# Patient Record
Sex: Male | Born: 1945 | Race: White | Hispanic: No | Marital: Married | Smoking: Former smoker
Health system: Southern US, Community
[De-identification: ages and names within clinical notes are randomized; demographics above are authoritative.]

## PROBLEM LIST (undated history)

## (undated) DIAGNOSIS — I2510 Atherosclerotic heart disease of native coronary artery without angina pectoris: Secondary | ICD-10-CM

## (undated) DIAGNOSIS — K8020 Calculus of gallbladder without cholecystitis without obstruction: Secondary | ICD-10-CM

## (undated) DIAGNOSIS — K5790 Diverticulosis of intestine, part unspecified, without perforation or abscess without bleeding: Secondary | ICD-10-CM

## (undated) DIAGNOSIS — Z972 Presence of dental prosthetic device (complete) (partial): Secondary | ICD-10-CM

## (undated) DIAGNOSIS — Z9289 Personal history of other medical treatment: Secondary | ICD-10-CM

## (undated) DIAGNOSIS — K219 Gastro-esophageal reflux disease without esophagitis: Secondary | ICD-10-CM

## (undated) DIAGNOSIS — I700 Atherosclerosis of aorta: Secondary | ICD-10-CM

## (undated) DIAGNOSIS — I219 Acute myocardial infarction, unspecified: Secondary | ICD-10-CM

## (undated) DIAGNOSIS — K766 Portal hypertension: Secondary | ICD-10-CM

## (undated) DIAGNOSIS — D696 Thrombocytopenia, unspecified: Secondary | ICD-10-CM

## (undated) DIAGNOSIS — M069 Rheumatoid arthritis, unspecified: Secondary | ICD-10-CM

## (undated) DIAGNOSIS — K7460 Unspecified cirrhosis of liver: Secondary | ICD-10-CM

## (undated) DIAGNOSIS — I77810 Thoracic aortic ectasia: Secondary | ICD-10-CM

## (undated) DIAGNOSIS — K4090 Unilateral inguinal hernia, without obstruction or gangrene, not specified as recurrent: Secondary | ICD-10-CM

## (undated) DIAGNOSIS — F1011 Alcohol abuse, in remission: Secondary | ICD-10-CM

## (undated) DIAGNOSIS — R161 Splenomegaly, not elsewhere classified: Secondary | ICD-10-CM

## (undated) DIAGNOSIS — M542 Cervicalgia: Secondary | ICD-10-CM

## (undated) DIAGNOSIS — Z87442 Personal history of urinary calculi: Secondary | ICD-10-CM

## (undated) DIAGNOSIS — I209 Angina pectoris, unspecified: Secondary | ICD-10-CM

## (undated) DIAGNOSIS — I10 Essential (primary) hypertension: Secondary | ICD-10-CM

## (undated) DIAGNOSIS — K08109 Complete loss of teeth, unspecified cause, unspecified class: Secondary | ICD-10-CM

## (undated) DIAGNOSIS — C801 Malignant (primary) neoplasm, unspecified: Secondary | ICD-10-CM

## (undated) DIAGNOSIS — R011 Cardiac murmur, unspecified: Secondary | ICD-10-CM

## (undated) HISTORY — PX: HEMORRHOID SURGERY: SHX153

## (undated) HISTORY — PX: EYE SURGERY: SHX253

## (undated) HISTORY — PX: CARDIAC CATHETERIZATION: SHX172

## (undated) HISTORY — PX: CORONARY ANGIOPLASTY: SHX604

---
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
## 1974-11-24 HISTORY — PX: KNEE ARTHROSCOPY: SUR90

## 2016-01-31 DIAGNOSIS — H2511 Age-related nuclear cataract, right eye: Secondary | ICD-10-CM

## 2016-01-31 HISTORY — DX: Age-related nuclear cataract, right eye: H25.11

## 2016-11-24 HISTORY — PX: COLONOSCOPY: SHX174

## 2016-12-23 DIAGNOSIS — I10 Essential (primary) hypertension: Secondary | ICD-10-CM | POA: Insufficient documentation

## 2017-09-17 DIAGNOSIS — R7301 Impaired fasting glucose: Secondary | ICD-10-CM | POA: Insufficient documentation

## 2017-11-24 DIAGNOSIS — Z9841 Cataract extraction status, right eye: Secondary | ICD-10-CM

## 2017-11-24 DIAGNOSIS — Z9842 Cataract extraction status, left eye: Secondary | ICD-10-CM

## 2017-11-24 HISTORY — PX: CATARACT EXTRACTION W/ INTRAOCULAR LENS  IMPLANT, BILATERAL: SHX1307

## 2017-11-24 HISTORY — DX: Cataract extraction status, right eye: Z98.42

## 2017-11-24 HISTORY — DX: Cataract extraction status, right eye: Z98.41

## 2018-11-23 DIAGNOSIS — Z6831 Body mass index (BMI) 31.0-31.9, adult: Secondary | ICD-10-CM | POA: Insufficient documentation

## 2018-11-23 DIAGNOSIS — K219 Gastro-esophageal reflux disease without esophagitis: Secondary | ICD-10-CM | POA: Insufficient documentation

## 2018-11-23 DIAGNOSIS — E6609 Other obesity due to excess calories: Secondary | ICD-10-CM | POA: Insufficient documentation

## 2019-05-31 DIAGNOSIS — Z6830 Body mass index (BMI) 30.0-30.9, adult: Secondary | ICD-10-CM | POA: Diagnosis not present

## 2019-05-31 DIAGNOSIS — Z0000 Encounter for general adult medical examination without abnormal findings: Secondary | ICD-10-CM | POA: Diagnosis not present

## 2019-05-31 DIAGNOSIS — D696 Thrombocytopenia, unspecified: Secondary | ICD-10-CM | POA: Diagnosis not present

## 2019-05-31 DIAGNOSIS — E6609 Other obesity due to excess calories: Secondary | ICD-10-CM | POA: Diagnosis not present

## 2019-05-31 DIAGNOSIS — I10 Essential (primary) hypertension: Secondary | ICD-10-CM | POA: Diagnosis not present

## 2019-06-08 DIAGNOSIS — M170 Bilateral primary osteoarthritis of knee: Secondary | ICD-10-CM | POA: Diagnosis not present

## 2019-08-09 DIAGNOSIS — I25119 Atherosclerotic heart disease of native coronary artery with unspecified angina pectoris: Secondary | ICD-10-CM | POA: Diagnosis not present

## 2019-08-09 DIAGNOSIS — I10 Essential (primary) hypertension: Secondary | ICD-10-CM | POA: Diagnosis not present

## 2019-08-09 DIAGNOSIS — E6609 Other obesity due to excess calories: Secondary | ICD-10-CM | POA: Diagnosis not present

## 2019-08-09 DIAGNOSIS — R0602 Shortness of breath: Secondary | ICD-10-CM | POA: Diagnosis not present

## 2019-08-09 DIAGNOSIS — Z6830 Body mass index (BMI) 30.0-30.9, adult: Secondary | ICD-10-CM | POA: Diagnosis not present

## 2019-08-09 DIAGNOSIS — K219 Gastro-esophageal reflux disease without esophagitis: Secondary | ICD-10-CM | POA: Diagnosis not present

## 2019-08-18 DIAGNOSIS — I25119 Atherosclerotic heart disease of native coronary artery with unspecified angina pectoris: Secondary | ICD-10-CM | POA: Diagnosis not present

## 2019-08-18 DIAGNOSIS — R0602 Shortness of breath: Secondary | ICD-10-CM | POA: Diagnosis not present

## 2019-08-18 DIAGNOSIS — I10 Essential (primary) hypertension: Secondary | ICD-10-CM | POA: Diagnosis not present

## 2019-11-09 DIAGNOSIS — M170 Bilateral primary osteoarthritis of knee: Secondary | ICD-10-CM | POA: Diagnosis not present

## 2019-11-24 DIAGNOSIS — I10 Essential (primary) hypertension: Secondary | ICD-10-CM | POA: Diagnosis not present

## 2019-11-24 DIAGNOSIS — D696 Thrombocytopenia, unspecified: Secondary | ICD-10-CM | POA: Diagnosis not present

## 2019-12-01 DIAGNOSIS — I10 Essential (primary) hypertension: Secondary | ICD-10-CM | POA: Diagnosis not present

## 2019-12-01 DIAGNOSIS — E6609 Other obesity due to excess calories: Secondary | ICD-10-CM | POA: Diagnosis not present

## 2019-12-01 DIAGNOSIS — F17210 Nicotine dependence, cigarettes, uncomplicated: Secondary | ICD-10-CM | POA: Diagnosis not present

## 2019-12-01 DIAGNOSIS — I25119 Atherosclerotic heart disease of native coronary artery with unspecified angina pectoris: Secondary | ICD-10-CM | POA: Diagnosis not present

## 2019-12-01 DIAGNOSIS — D696 Thrombocytopenia, unspecified: Secondary | ICD-10-CM | POA: Diagnosis not present

## 2019-12-01 DIAGNOSIS — Z125 Encounter for screening for malignant neoplasm of prostate: Secondary | ICD-10-CM | POA: Diagnosis not present

## 2019-12-01 DIAGNOSIS — Z6831 Body mass index (BMI) 31.0-31.9, adult: Secondary | ICD-10-CM | POA: Diagnosis not present

## 2019-12-01 DIAGNOSIS — K589 Irritable bowel syndrome without diarrhea: Secondary | ICD-10-CM | POA: Diagnosis not present

## 2019-12-30 DIAGNOSIS — H26493 Other secondary cataract, bilateral: Secondary | ICD-10-CM | POA: Diagnosis not present

## 2020-02-06 DIAGNOSIS — E6609 Other obesity due to excess calories: Secondary | ICD-10-CM | POA: Diagnosis not present

## 2020-02-06 DIAGNOSIS — I10 Essential (primary) hypertension: Secondary | ICD-10-CM | POA: Diagnosis not present

## 2020-02-06 DIAGNOSIS — I25119 Atherosclerotic heart disease of native coronary artery with unspecified angina pectoris: Secondary | ICD-10-CM | POA: Diagnosis not present

## 2020-02-06 DIAGNOSIS — Z6831 Body mass index (BMI) 31.0-31.9, adult: Secondary | ICD-10-CM | POA: Diagnosis not present

## 2020-02-08 DIAGNOSIS — M1712 Unilateral primary osteoarthritis, left knee: Secondary | ICD-10-CM | POA: Diagnosis not present

## 2020-04-27 DIAGNOSIS — M170 Bilateral primary osteoarthritis of knee: Secondary | ICD-10-CM | POA: Diagnosis not present

## 2020-05-23 DIAGNOSIS — D696 Thrombocytopenia, unspecified: Secondary | ICD-10-CM | POA: Diagnosis not present

## 2020-05-23 DIAGNOSIS — Z125 Encounter for screening for malignant neoplasm of prostate: Secondary | ICD-10-CM | POA: Diagnosis not present

## 2020-05-23 DIAGNOSIS — I10 Essential (primary) hypertension: Secondary | ICD-10-CM | POA: Diagnosis not present

## 2020-05-23 DIAGNOSIS — I25119 Atherosclerotic heart disease of native coronary artery with unspecified angina pectoris: Secondary | ICD-10-CM | POA: Diagnosis not present

## 2020-05-30 DIAGNOSIS — D696 Thrombocytopenia, unspecified: Secondary | ICD-10-CM | POA: Diagnosis not present

## 2020-05-30 DIAGNOSIS — Z0000 Encounter for general adult medical examination without abnormal findings: Secondary | ICD-10-CM | POA: Diagnosis not present

## 2020-05-30 DIAGNOSIS — F17210 Nicotine dependence, cigarettes, uncomplicated: Secondary | ICD-10-CM | POA: Diagnosis not present

## 2020-07-18 DIAGNOSIS — M1712 Unilateral primary osteoarthritis, left knee: Secondary | ICD-10-CM | POA: Diagnosis not present

## 2020-07-18 DIAGNOSIS — M170 Bilateral primary osteoarthritis of knee: Secondary | ICD-10-CM | POA: Diagnosis not present

## 2020-07-18 DIAGNOSIS — M1711 Unilateral primary osteoarthritis, right knee: Secondary | ICD-10-CM | POA: Diagnosis not present

## 2020-08-23 DIAGNOSIS — I10 Essential (primary) hypertension: Secondary | ICD-10-CM | POA: Diagnosis not present

## 2020-08-23 DIAGNOSIS — Z6831 Body mass index (BMI) 31.0-31.9, adult: Secondary | ICD-10-CM | POA: Diagnosis not present

## 2020-08-23 DIAGNOSIS — E6609 Other obesity due to excess calories: Secondary | ICD-10-CM | POA: Diagnosis not present

## 2020-08-23 DIAGNOSIS — I25119 Atherosclerotic heart disease of native coronary artery with unspecified angina pectoris: Secondary | ICD-10-CM | POA: Diagnosis not present

## 2020-08-23 DIAGNOSIS — Z01810 Encounter for preprocedural cardiovascular examination: Secondary | ICD-10-CM | POA: Diagnosis not present

## 2020-08-23 DIAGNOSIS — R0789 Other chest pain: Secondary | ICD-10-CM | POA: Diagnosis not present

## 2020-09-05 DIAGNOSIS — I10 Essential (primary) hypertension: Secondary | ICD-10-CM | POA: Diagnosis not present

## 2020-09-05 DIAGNOSIS — R0789 Other chest pain: Secondary | ICD-10-CM | POA: Diagnosis not present

## 2020-09-05 DIAGNOSIS — I25119 Atherosclerotic heart disease of native coronary artery with unspecified angina pectoris: Secondary | ICD-10-CM | POA: Diagnosis not present

## 2020-09-11 DIAGNOSIS — I10 Essential (primary) hypertension: Secondary | ICD-10-CM | POA: Diagnosis not present

## 2020-09-11 DIAGNOSIS — I25119 Atherosclerotic heart disease of native coronary artery with unspecified angina pectoris: Secondary | ICD-10-CM | POA: Diagnosis not present

## 2020-09-11 DIAGNOSIS — Z125 Encounter for screening for malignant neoplasm of prostate: Secondary | ICD-10-CM | POA: Diagnosis not present

## 2020-09-11 DIAGNOSIS — K219 Gastro-esophageal reflux disease without esophagitis: Secondary | ICD-10-CM | POA: Diagnosis not present

## 2020-09-13 DIAGNOSIS — M1712 Unilateral primary osteoarthritis, left knee: Secondary | ICD-10-CM | POA: Diagnosis not present

## 2020-09-16 DIAGNOSIS — M1712 Unilateral primary osteoarthritis, left knee: Secondary | ICD-10-CM

## 2020-09-16 HISTORY — DX: Unilateral primary osteoarthritis, left knee: M17.12

## 2020-10-03 DIAGNOSIS — M1711 Unilateral primary osteoarthritis, right knee: Secondary | ICD-10-CM | POA: Diagnosis not present

## 2020-10-03 DIAGNOSIS — M1712 Unilateral primary osteoarthritis, left knee: Secondary | ICD-10-CM | POA: Diagnosis not present

## 2020-11-12 DIAGNOSIS — J0100 Acute maxillary sinusitis, unspecified: Secondary | ICD-10-CM | POA: Diagnosis not present

## 2020-11-20 DIAGNOSIS — J0100 Acute maxillary sinusitis, unspecified: Secondary | ICD-10-CM | POA: Diagnosis not present

## 2020-11-25 NOTE — Discharge Instructions (Signed)
Instructions after Total Knee Replacement   Luke Wall, Jr., M.D.     Dept. of Orthopaedics & Sports Medicine  [facility]  [address]  [place], [place]  [address]  Phone: [phone]   Fax: [phone]    DIET: Drink plenty of non-alcoholic fluids. Resume your normal diet. Include foods high in fiber.  ACTIVITY:  You may use crutches or a walker with weight-bearing as tolerated, unless instructed otherwise. You may be weaned off of the walker or crutches by your Physical Therapist.  Do NOT place pillows under the knee. Anything placed under the knee could limit your ability to straighten the knee.   Continue doing gentle exercises. Exercising will reduce the pain and swelling, increase motion, and prevent muscle weakness.   Please continue to use the TED compression stockings for 6 weeks. You may remove the stockings at night, but should reapply them in the morning. Do not drive or operate any equipment until instructed.  WOUND CARE:  Continue to use the PolarCare or ice packs periodically to reduce pain and swelling. You may bathe or shower after the staples are removed at the first office visit following surgery.  MEDICATIONS: You may resume your regular medications. Please take the pain medication as prescribed on the medication. Do not take pain medication on an empty stomach. You have been given a prescription for a blood thinner (Lovenox or Coumadin). Please take the medication as instructed. (NOTE: After completing a 2 week course of Lovenox, take one Enteric-coated aspirin once a day. This along with elevation will help reduce the possibility of phlebitis in your operated leg.) Do not drive or drink alcoholic beverages when taking pain medications.  CALL THE OFFICE FOR: Temperature above 101 degrees Excessive bleeding or drainage on the dressing. Excessive swelling, coldness, or paleness of the toes. Persistent nausea and vomiting.  FOLLOW-UP:  You  should have an appointment to return to the office in 10-14 days after surgery. Arrangements have been made for continuation of Physical Therapy (either home therapy or outpatient therapy).   [facility] Department Directory         [email or web address]       [email or web address]          Cardiology  Appointments: [place] - [phone] [place] - [phone]  Endocrinology  Appointments: [place] - [phone] [place] - [phone]  Gastroenterology  Appointments: [place] - [phone] [place] - [phone]        General Surgery   Appointments: [place] - [phone]  Internal Medicine/Family Medicine  Appointments: [place] - [phone] [place] - [phone] [place] - [phone]  Metabolic and Weigh Loss Surgery  Appointments: [place] - [phone]        Neurology  Appointments: [place] - [phone] [place] - [phone]  Neurosurgery  Appointments: [place] - [phone]  Obstetrics & Gynecology  Appointments: [place] - [phone] [place] - [phone]        Pediatrics  Appointments: [place] - [phone] [place] - [phone]  Physiatry  Appointments: [place] -[phone]  Physical Therapy  Appointments: [place] - [phone] [place] - [phone]        Podiatry  Appointments: [place] - [phone] [place] - [phone]  Pulmonology  Appointments: [place] - [phone]  Rheumatology  Appointments: [place] - [phone]        [place] Location: [facility]  [address] , [place]  [address]  [place] Location: [facility] [address] [place], [place]  [address]  [place] Location: [facility] [address] [place], [place]  [address]    

## 2020-11-29 ENCOUNTER — Other Ambulatory Visit: Payer: Self-pay

## 2020-11-29 ENCOUNTER — Encounter
Admission: RE | Admit: 2020-11-29 | Discharge: 2020-11-29 | Disposition: A | Discharge status: Home / Self Care | Payer: Medicare HMO | Source: Ambulatory Visit | Attending: Orthopedic Surgery | Admitting: Orthopedic Surgery

## 2020-11-29 NOTE — Patient Instructions (Addendum)
Your procedure is scheduled on: 12-14-20 FRIDAY Report to the Registration Desk on the 1st floor of the [facility]-Then proceed to the 2nd floor Surgery Desk in the Medical [facility] To find out your arrival time, please call [phone] between 1PM - 3PM on:12-14-20 THURSDAY  REMEMBER: Instructions that are not followed completely may result in serious medical risk, up to and including death; or upon the discretion of your surgeon and anesthesiologist your surgery may need to be rescheduled.  Do not eat food after midnight the night before surgery.  No gum chewing, lozengers or hard candies.  You may however, drink CLEAR liquids up to 2 hours before you are scheduled to arrive for your surgery. Do not drink anything within 2 hours of your scheduled arrival time.  Clear liquids include: - water  - apple juice without pulp - gatorade  - black coffee or tea (Do NOT add milk or creamers to the coffee or tea) Do NOT drink anything that is not on this list.  In addition, your doctor has ordered for you to drink the provided  Ensure Pre-Surgery Clear Carbohydrate Drink  Drinking this carbohydrate drink up to two hours before surgery helps to reduce insulin resistance and improve patient outcomes. Please complete drinking 2 hours prior to scheduled arrival time.  TAKE THESE MEDICATIONS THE MORNING OF SURGERY WITH A SIP OF WATER: -CRESTOR (ROSUVASTATIN) -ATENOLOL -PRILOSEC (OMEPRAZOLE)-take one the night before and one on the morning of surgery - helps to prevent nausea after surgery.)  Use inhalers on the day of surgery and bring to the hospital-USE YOUR ALBUTEROL INHALER THE MORNING OF SURGERY AND BRING YOUR INHALER TO THE HOSPITAL  One week prior to surgery: Stop Anti-inflammatories (NSAIDS) such as DICLOFENAC(VOLTAREN),Advil, Aleve, Ibuprofen, Motrin, Naproxen, Naprosyn and Aspirin based products such as Excedrin, Goodys Powder, BC Powder-OK TO USE TYLENOL IF NEEDED  Stop ANY OVER THE  COUNTER supplements until after surgery.   No Alcohol for 24 hours before or after surgery.  No Smoking including e-cigarettes for 24 hours prior to surgery.  No chewable tobacco products for at least 6 hours prior to surgery.  No nicotine patches on the day of surgery.  Do not use any "recreational" drugs for at least a week prior to your surgery.  Please be advised that the combination of cocaine and anesthesia may have negative outcomes, up to and including death. If you test positive for cocaine, your surgery will be cancelled.  On the morning of surgery brush your teeth with toothpaste and water, you may rinse your mouth with mouthwash if you wish. Do not swallow any toothpaste or mouthwash.  Do not wear jewelry, make-up, hairpins, clips or nail polish.  Do not wear lotions, powders, or perfumes.   Do not shave body from the neck down 48 hours prior to surgery just in case you cut yourself which could leave a site for infection.  Also, freshly shaved skin may become irritated if using the CHG soap.  Contact lenses, hearing aids and dentures may not be worn into surgery.  Do not bring valuables to the hospital. [facility] is not responsible for any missing/lost belongings or valuables.   Use CHG Soap as directed on instruction sheet.  Notify your doctor if there is any change in your medical condition (cold, fever, infection).  Wear comfortable clothing (specific to your surgery type) to the hospital.  Plan for stool softeners for home use; pain medications have a tendency to cause constipation. You can also  help prevent constipation by eating foods high in fiber such as fruits and vegetables and drinking plenty of fluids as your diet allows.  After surgery, you can help prevent lung complications by doing breathing exercises.  Take deep breaths and cough every 1-2 hours. Your doctor may order a device called an Incentive Spirometer to help you take deep breaths. When  coughing or sneezing, hold a pillow firmly against your incision with both hands. This is called "splinting." Doing this helps protect your incision. It also decreases belly discomfort.  If you are being admitted to the hospital overnight, leave your suitcase in the car. After surgery it may be brought to your room.  If you are being discharged the day of surgery, you will not be allowed to drive home. You will need a responsible adult (18 years or older) to drive you home and stay with you that night.   If you are taking public transportation, you will need to have a responsible adult (18 years or older) with you. Please confirm with your physician that it is acceptable to use public transportation.   Please call the [place] Dept. at [phone] if you have any questions about these instructions.  Visitation Policy:  Patients undergoing a surgery or procedure may have one family member or support person with them as long as that person is not COVID-19 positive or experiencing its symptoms.  That person may remain in the waiting area during the procedure.  Inpatient Visitation:    Visiting hours are 7 a.m. to 8 p.m. Patients will be allowed one visitor. The visitor may change daily. The visitor must pass COVID-19 screenings, use hand sanitizer when entering and exiting the patient's room and wear a mask at all times, including in the patient's room. Patients must also wear a mask when staff or their visitor are in the room. Masking is required regardless of vaccination status. Systemwide, no visitors 17 or younger.

## 2020-12-06 ENCOUNTER — Inpatient Hospital Stay: Admission: RE | Admit: 2020-12-06 | Payer: Medicare HMO | Source: Ambulatory Visit

## 2020-12-12 ENCOUNTER — Other Ambulatory Visit: Payer: Self-pay

## 2020-12-14 ENCOUNTER — Encounter: Admission: RE | Payer: Self-pay | Source: Home / Self Care

## 2020-12-14 ENCOUNTER — Inpatient Hospital Stay: Admission: RE | Admit: 2020-12-14 | Payer: Medicare HMO | Source: Home / Self Care | Admitting: Orthopedic Surgery

## 2020-12-14 SURGERY — ARTHROPLASTY, KNEE, TOTAL, USING IMAGELESS COMPUTER-ASSISTED NAVIGATION
Anesthesia: Choice | Site: Knee | Laterality: Left

## 2021-01-09 DIAGNOSIS — M1711 Unilateral primary osteoarthritis, right knee: Secondary | ICD-10-CM | POA: Diagnosis not present

## 2021-01-31 DIAGNOSIS — M1712 Unilateral primary osteoarthritis, left knee: Secondary | ICD-10-CM | POA: Diagnosis not present

## 2021-01-31 NOTE — Discharge Instructions (Signed)
Instructions after Total Knee Replacement   Banessa Mao P. Shelly Spenser, Jr., M.D.     Dept. of Orthopaedics & Sports Medicine  [facility]  [address]  [place], [place]  [address]  Phone: [phone]   Fax: [phone]    DIET: Drink plenty of non-alcoholic fluids. Resume your normal diet. Include foods high in fiber.  ACTIVITY:  You may use crutches or a walker with weight-bearing as tolerated, unless instructed otherwise. You may be weaned off of the walker or crutches by your Physical Therapist.  Do NOT place pillows under the knee. Anything placed under the knee could limit your ability to straighten the knee.   Continue doing gentle exercises. Exercising will reduce the pain and swelling, increase motion, and prevent muscle weakness.   Please continue to use the TED compression stockings for 6 weeks. You may remove the stockings at night, but should reapply them in the morning. Do not drive or operate any equipment until instructed.  WOUND CARE:  Continue to use the PolarCare or ice packs periodically to reduce pain and swelling. You may bathe or shower after the staples are removed at the first office visit following surgery.  MEDICATIONS: You may resume your regular medications. Please take the pain medication as prescribed on the medication. Do not take pain medication on an empty stomach. You have been given a prescription for a blood thinner (Lovenox or Coumadin). Please take the medication as instructed. (NOTE: After completing a 2 week course of Lovenox, take one Enteric-coated aspirin once a day. This along with elevation will help reduce the possibility of phlebitis in your operated leg.) Do not drive or drink alcoholic beverages when taking pain medications.  CALL THE OFFICE FOR: Temperature above 101 degrees Excessive bleeding or drainage on the dressing. Excessive swelling, coldness, or paleness of the toes. Persistent nausea and vomiting.  FOLLOW-UP:  You  should have an appointment to return to the office in 10-14 days after surgery. Arrangements have been made for continuation of Physical Therapy (either home therapy or outpatient therapy).   [facility] Department Directory         [email or web address]       [email or web address]          Cardiology  Appointments: [place] - [phone] [place] - [phone]  Endocrinology  Appointments: [place] - [phone] [place] - [phone]  Gastroenterology  Appointments: [place] - [phone] [place] - [phone]        General Surgery   Appointments: [place] - [phone]  Internal Medicine/Family Medicine  Appointments: [place] - [phone] [place] - [phone] [place] - [phone]  Metabolic and Weigh Loss Surgery  Appointments: [place] - [phone]        Neurology  Appointments: [place] - [phone] [place] - [phone]  Neurosurgery  Appointments: [place] - [phone]  Obstetrics & Gynecology  Appointments: [place] - [phone] [place] - [phone]        Pediatrics  Appointments: [place] - [phone] [place] - [phone]  Physiatry  Appointments: [place] -[phone]  Physical Therapy  Appointments: [place] - [phone] [place] - [phone]        Podiatry  Appointments: [place] - [phone] [place] - [phone]  Pulmonology  Appointments: [place] - [phone]  Rheumatology  Appointments: [place] - [phone]        [place] Location: [facility]  [address] , [place]  [address]  [place] Location: [facility] [address] [place], [place]  [address]  [place] Location: [facility] [address] [place], [place]  [address]    

## 2021-02-01 ENCOUNTER — Other Ambulatory Visit: Payer: Self-pay

## 2021-02-01 ENCOUNTER — Encounter
Admission: RE | Admit: 2021-02-01 | Discharge: 2021-02-01 | Disposition: A | Discharge status: Home / Self Care | Payer: Medicare HMO | Source: Ambulatory Visit | Attending: Orthopedic Surgery | Admitting: Orthopedic Surgery

## 2021-02-01 DIAGNOSIS — Z01810 Encounter for preprocedural cardiovascular examination: Secondary | ICD-10-CM | POA: Diagnosis not present

## 2021-02-01 DIAGNOSIS — Z01818 Encounter for other preprocedural examination: Secondary | ICD-10-CM | POA: Diagnosis not present

## 2021-02-01 HISTORY — DX: Gastro-esophageal reflux disease without esophagitis: K21.9

## 2021-02-01 HISTORY — DX: Atherosclerotic heart disease of native coronary artery without angina pectoris: I25.10

## 2021-02-01 HISTORY — DX: Essential (primary) hypertension: I10

## 2021-02-01 HISTORY — DX: Personal history of urinary calculi: Z87.442

## 2021-02-01 LAB — URINALYSIS, ROUTINE W REFLEX MICROSCOPIC
Bilirubin Urine: NEGATIVE
Glucose, UA: NEGATIVE mg/dL
Hgb urine dipstick: NEGATIVE
Ketones, ur: NEGATIVE mg/dL
Leukocytes,Ua: NEGATIVE
Nitrite: NEGATIVE
Protein, ur: NEGATIVE mg/dL
Specific Gravity, Urine: 1.011 (ref 1.005–1.030)
pH: 5 (ref 5.0–8.0)

## 2021-02-01 LAB — APTT: aPTT: 34 seconds (ref 24–36)

## 2021-02-01 LAB — COMPREHENSIVE METABOLIC PANEL
ALT: 33 U/L (ref 0–44)
AST: 36 U/L (ref 15–41)
Albumin: 3.9 g/dL (ref 3.5–5.0)
Alkaline Phosphatase: 56 U/L (ref 38–126)
Anion gap: 7 (ref 5–15)
BUN: 16 mg/dL (ref 8–23)
CO2: 26 mmol/L (ref 22–32)
Calcium: 9.6 mg/dL (ref 8.9–10.3)
Chloride: 105 mmol/L (ref 98–111)
Creatinine, Ser: 0.87 mg/dL (ref 0.61–1.24)
GFR, Estimated: >60 mL/min (ref >60)
Glucose, Bld: 84 mg/dL (ref 70–99)
Potassium: 3.7 mmol/L (ref 3.5–5.1)
Sodium: 138 mmol/L (ref 135–145)
Total Bilirubin: 1.6 mg/dL — ABNORMAL HIGH (ref 0.3–1.2)
Total Protein: 6.9 g/dL (ref 6.5–8.1)

## 2021-02-01 LAB — CBC
HCT: 45.8 % (ref 39.0–52.0)
Hemoglobin: 15.8 g/dL (ref 13.0–17.0)
MCH: 31.7 pg (ref 26.0–34.0)
MCHC: 34.5 g/dL (ref 30.0–36.0)
MCV: 92 fL (ref 80.0–100.0)
Platelets: 109 10*3/uL — ABNORMAL LOW (ref 150–400)
RBC: 4.98 MIL/uL (ref 4.22–5.81)
RDW: 13.1 % (ref 11.5–15.5)
WBC: 6 10*3/uL (ref 4.0–10.5)
nRBC: 0 % (ref 0.0–0.2)

## 2021-02-01 LAB — PROTIME-INR
INR: 1.1 (ref 0.8–1.2)
Prothrombin Time: 13.4 seconds (ref 11.4–15.2)

## 2021-02-01 LAB — SEDIMENTATION RATE: Sed Rate: 4 mm/hr (ref 0–20)

## 2021-02-01 LAB — SURGICAL PCR SCREEN
MRSA, PCR: NEGATIVE
Staphylococcus aureus: NEGATIVE

## 2021-02-01 LAB — C-REACTIVE PROTEIN: CRP: 0.7 mg/dL (ref <1.0)

## 2021-02-01 NOTE — Pre-Procedure Instructions (Signed)
Has appointment with Dr. Ubaldo Glassing (cardiology) on 02/04/21 to follow up history of CAD and HTN. Clearance form and EKG (performed today) faxed to Dr. Bethanne Ginger office for upcoming knee surgery.

## 2021-02-01 NOTE — Patient Instructions (Addendum)
Your procedure is scheduled on:  Friday, March 18 Report to the Registration Desk on the 1st floor of the [facility]. To find out your arrival time, please call [phone] between 1PM - 3PM on: Thursday, March 17  REMEMBER: Instructions that are not followed completely may result in serious medical risk, up to and including death; or upon the discretion of your surgeon and anesthesiologist your surgery may need to be rescheduled.  Do not eat food after midnight the night before surgery.  No gum chewing, lozengers or hard candies.  You may however, drink CLEAR liquids up to 2 hours before you are scheduled to arrive for your surgery. Do not drink anything within 2 hours of your scheduled arrival time.  Clear liquids include: - water  - apple juice without pulp - gatorade (not RED, PURPLE, OR BLUE) - black coffee or tea (Do NOT add milk or creamers to the coffee or tea) Do NOT drink anything that is not on this list.  In addition, your doctor has ordered for you to drink the provided  Ensure Pre-Surgery Clear Carbohydrate Drink  Drinking this carbohydrate drink up to two hours before surgery helps to reduce insulin resistance and improve patient outcomes. Please complete drinking 2 hours prior to scheduled arrival time.  TAKE THESE MEDICATIONS THE MORNING OF SURGERY WITH A SIP OF WATER:  1.  Albuterol inhaler 2.  Atenolol 3.  Omeprazole - (take one the night before and one on the morning of surgery - helps to prevent nausea after surgery.)  Use inhalers on the day of surgery and bring to the hospital.  One week prior to surgery: today, March 11 Stop diclofenac, Anti-inflammatories (NSAIDS) such as Advil, Aleve, Ibuprofen, Motrin, Naproxen, Naprosyn and Aspirin based products such as Excedrin, Goodys Powder, BC Powder. Stop ANY OVER THE COUNTER supplements until after surgery. You can still take Tylenol if needed for pain.  No Alcohol for 24 hours before or after surgery.  No  Smoking including e-cigarettes for 24 hours prior to surgery.  No chewable tobacco products for at least 6 hours prior to surgery.  No nicotine patches on the day of surgery.  Do not use any "recreational" drugs for at least a week prior to your surgery.  Please be advised that the combination of cocaine and anesthesia may have negative outcomes, up to and including death. If you test positive for cocaine, your surgery will be cancelled.  On the morning of surgery brush your teeth with toothpaste and water, you may rinse your mouth with mouthwash if you wish. Do not swallow any toothpaste or mouthwash.  Do not wear jewelry.  Do not wear lotions, powders, or perfumes.   Do not shave body from the neck down 48 hours prior to surgery just in case you cut yourself which could leave a site for infection.  Also, freshly shaved skin may become irritated if using the CHG soap.  Contact lenses, hearing aids and dentures may not be worn into surgery.  Do not bring valuables to the hospital. [facility] is not responsible for any missing/lost belongings or valuables.   Use CHG Soap as directed on instruction sheet.  Notify your doctor if there is any change in your medical condition (cold, fever, infection).  Wear comfortable clothing (specific to your surgery type) to the hospital.  Plan for stool softeners for home use; pain medications have a tendency to cause constipation. You can also help prevent constipation by eating foods high in fiber  such as fruits and vegetables and drinking plenty of fluids as your diet allows.  After surgery, you can help prevent lung complications by doing breathing exercises.  Take deep breaths and cough every 1-2 hours. Your doctor may order a device called an Incentive Spirometer to help you take deep breaths..  If you are being admitted to the hospital overnight, leave your suitcase in the car. After surgery it may be brought to your room.  If you are  being discharged the day of surgery, you will not be allowed to drive home. You will need a responsible adult (18 years or older) to drive you home and stay with you that night.   If you are taking public transportation, you will need to have a responsible adult (18 years or older) with you. Please confirm with your physician that it is acceptable to use public transportation.   Please call the [place] Dept. at [phone] if you have any questions about these instructions.  Surgery Visitation Policy:  Patients undergoing a surgery or procedure may have one family member or support person with them as long as that person is not COVID-19 positive or experiencing its symptoms.  That person may remain in the waiting area during the procedure.  Inpatient Visitation:    Visiting hours are 7 a.m. to 8 p.m. Inpatients will be allowed two visitors daily. The visitors may change each day during the patient's stay. No visitors under the age of 32. Any visitor under the age of 79 must be accompanied by an adult. The visitor must pass COVID-19 screenings, use hand sanitizer when entering and exiting the patient's room and wear a mask at all times, including in the patient's room. Patients must also wear a mask when staff or their visitor are in the room. Masking is required regardless of vaccination status.

## 2021-02-03 LAB — URINE CULTURE
Culture: 20000 — AB
Special Requests: NORMAL

## 2021-02-04 DIAGNOSIS — Z6831 Body mass index (BMI) 31.0-31.9, adult: Secondary | ICD-10-CM | POA: Diagnosis not present

## 2021-02-04 DIAGNOSIS — E6609 Other obesity due to excess calories: Secondary | ICD-10-CM | POA: Diagnosis not present

## 2021-02-04 DIAGNOSIS — I10 Essential (primary) hypertension: Secondary | ICD-10-CM | POA: Diagnosis not present

## 2021-02-04 DIAGNOSIS — I25119 Atherosclerotic heart disease of native coronary artery with unspecified angina pectoris: Secondary | ICD-10-CM | POA: Diagnosis not present

## 2021-02-06 ENCOUNTER — Other Ambulatory Visit: Payer: Self-pay

## 2021-02-06 ENCOUNTER — Other Ambulatory Visit
Admission: RE | Admit: 2021-02-06 | Discharge: 2021-02-06 | Disposition: A | Discharge status: Home / Self Care | Payer: Medicare HMO | Source: Ambulatory Visit | Attending: Orthopedic Surgery | Admitting: Orthopedic Surgery

## 2021-02-06 DIAGNOSIS — Z20822 Contact with and (suspected) exposure to covid-19: Secondary | ICD-10-CM | POA: Diagnosis not present

## 2021-02-06 DIAGNOSIS — Z01812 Encounter for preprocedural laboratory examination: Secondary | ICD-10-CM | POA: Diagnosis not present

## 2021-02-06 LAB — SARS CORONAVIRUS 2 (TAT 6-24 HRS): SARS Coronavirus 2: NEGATIVE

## 2021-02-07 ENCOUNTER — Encounter: Payer: Self-pay | Admitting: Orthopedic Surgery

## 2021-02-07 NOTE — H&P (Signed)
ORTHOPAEDIC HISTORY & PHYSICAL Gwenlyn Fudge, [place] - 01/31/2021 4:00 PM EST Formatting of this note is different from the original. [facility] Chief Complaint:   Chief Complaint  Patient presents with  . Knee Pain  H & P LEFT KNEE   History of Present Illness:   Luke Wall is a 74 y.o. male that presents to clinic today for his preoperative history and evaluation. Patient presents unaccompanied. The patient is scheduled to undergo a left total knee arthroplasty on 02/08/21 by Dr. Marry Guan. His pain began 17-23 months ago. The pain is located along the medial aspect of the knee. He describes his pain as aggravated by weightbearing. He reports associated swelling with some giving way of the knee. He denies associated numbness or tingling, denies locking.   The patient's symptoms have progressed to the point that they decrease his quality of life. The patient has previously undergone conservative treatment including NSAIDS and injections to the knee without adequate control of his symptoms.  Denies significant cardiac history, denies history of blood clots, denies past lumbar surgery.  Past Medical, Surgical, Family, Social History, Allergies, Medications:   Past Medical History:  Past Medical History:  Diagnosis Date  . GERD (gastroesophageal reflux disease)  . Hypertension  . Nuclear sclerotic cataract of right eye 01/31/2016  . Primary osteoarthritis of left knee 09/16/2020   Past Surgical History:  Past Surgical History:  Procedure Laterality Date  . CATARACT EXTRACTION 2019  . COLONOSCOPY  2018  . KNEE ARTHROSCOPY 1976  . TONSILLECTOMY 1960   Current Medications:  Current Outpatient Medications  Medication Sig Dispense Refill  . acetaminophen (TYLENOL) 500 MG tablet Take 1,000 mg by mouth every 6 (six) hours as needed  . albuterol 90 mcg/actuation inhaler Inhale 2 inhalations into the lungs every 4 (four) hours as needed for  Wheezing 1 each 1  . atenoloL (TENORMIN) 50 MG tablet Take 1 tablet (50 mg total) by mouth once daily 90 tablet 3  . diclofenac (VOLTAREN) 1 % topical gel Apply topically  . diclofenac (VOLTAREN) 75 MG EC tablet TAKE 1-2 TABLETS (75-150 MG TOTAL) BY MOUTH ONCE DAILY (Patient taking differently: Take 75 mg by mouth 2 (two) times daily with meals ) 180 tablet 1  . omeprazole (PRILOSEC) 20 MG DR capsule Take 1 capsule (20 mg total) by mouth once daily 90 capsule 3  . rosuvastatin (CRESTOR) 10 MG tablet Take 1 tablet (10 mg total) by mouth once daily 30 tablet 11  . acetaminophen (TYLENOL) 650 MG ER tablet Take 1,300 mg by mouth once daily as needed for Pain  . cetirizine (ZYRTEC) 10 MG tablet Take 1 tablet (10 mg total) by mouth once daily 30 tablet 0  . fluticasone propionate (FLONASE) 50 mcg/actuation nasal spray Place 2 sprays into both nostrils once daily 16 g 0  . hydrocodone-homatropine (HYCODAN) 5-1.5 mg/5 mL syrup Take 5 mLs by mouth every 6 (six) hours as needed (Patient not taking: Reported on 01/09/2021 ) 120 mL 0   No current facility-administered medications for this visit.   Allergies:  Allergies  Allergen Reactions  . Etodolac Other (See Comments)  sweating  . Tramadol-Acetaminophen Nausea   Social History:  Social History   Socioeconomic History  . Marital status: Widowed  Spouse name: Not on file  . Number of children: Not on file  . Years of education: 78  . Highest education level: 10th grade  Occupational History  . Occupation: Toolmaker  Tobacco  Use  . Smoking status: Former Smoker  Types: Cigarettes  . Smokeless tobacco: Never Used  . Tobacco comment: 2-3 cigarettes a month  Vaping Use  . Vaping Use: Never used  Substance and Sexual Activity  . Alcohol use: Not Currently  . Drug use: Never  . Sexual activity: Never  Other Topics Concern  . Not on file  Social History Narrative  Religious Affiliation: NONE   Social Determinants of Health   Financial  Resource Strain: Not on file  Food Insecurity: Not on file  Transportation Needs: Not on file  Physical Activity: Not on file  Stress: Not on file  Social Connections: Not on file  Housing Stability: Not on file   Family History:  Family History  Problem Relation Age of Onset  . Alcohol abuse Mother  . Stroke Maternal Grandmother   Review of Systems:   A 10+ ROS was performed, reviewed, and the pertinent orthopaedic findings are documented in the HPI.   Physical Examination:   BP (!) 144/78 (BP Location: Left upper arm, Patient Position: Sitting, BP Cuff Size: Adult)  Ht 170.2 cm (5\' 7" )  Wt 87.1 kg (192 lb)  BMI 30.07 kg/m   Patient is a well-developed, well-nourished male in no acute distress. Patient has normal mood and affect. Patient is alert and oriented to person, place, and time.   HEENT: Atraumatic, normocephalic. Pupils equal and reactive to light. Extraocular motion intact. Noninjected sclera.  Cardiovascular: Regular rate and rhythm, with no murmurs, rubs, or gallops. Distal pulses palpable.  Respiratory: Lungs clear to auscultation bilaterally.   Left Knee: Soft tissue swelling: minimal Effusion: none Erythema: none Crepitance: mild Tenderness: medial Alignment: relative varus Mediolateral laxity: medial pseudolaxity Posterior sag: negative Patellar tracking: Good tracking without evidence of subluxation or tilt Atrophy: VMO and vastus medialis atrophy.  Quadriceps tone was fair to good. Range of motion: 0/0/115 degrees  Sensation intact over the saphenous, lateral sural cutaneous, superficial fibular, and deep fibular nerve distributions.  Tests Performed/Reviewed:  X-rays  3 views of the left knee were obtained. Images reveal complete loss of medial compartment joint space with bone-on-bone contact and deformation of the tibial plateau noted. Significant subchondral sclerosis noted. Loss of patellofemoral joint space noted. No fractures.  I  personally ordered and interpreted today's x-rays.  Impression:   ICD-10-CM  1. Primary osteoarthritis of left knee M17.12   Plan:   The patient has end-stage degenerative changes of the left knee. It was explained to the patient that the condition is progressive in nature. Having failed conservative treatment, the patient has elected to proceed with a total joint arthroplasty. The patient will undergo a total joint arthroplasty with Dr. Marry Guan. The risks of surgery, including blood clot and infection, were discussed with the patient. Measures to reduce these risks, including the use of anticoagulation, perioperative antibiotics, and early ambulation were discussed. The importance of postoperative physical therapy was discussed with the patient. The patient elects to proceed with surgery. The patient is instructed to stop all blood thinners prior to surgery. The patient is instructed to call the hospital the day before surgery to learn of the proper arrival time.   Contact our office with any questions or concerns. Follow up as indicated, or sooner should any new problems arise, if conditions worsen, or if they are otherwise concerned.   Gwenlyn Fudge, PA-C [facility] [address], [place] [address] Phone: [phone]  This note was generated in part with  voice recognition software and I apologize for any typographical errors that were not detected and corrected.   Electronically signed by Gwenlyn Fudge, PA at 02/04/2021 1:06 AM EDT

## 2021-02-08 ENCOUNTER — Other Ambulatory Visit: Payer: Self-pay

## 2021-02-08 ENCOUNTER — Encounter: Payer: Self-pay | Admitting: Orthopedic Surgery

## 2021-02-08 ENCOUNTER — Encounter
Admission: RE | Disposition: A | Discharge status: Home / Self Care | Payer: Self-pay | Source: Home / Self Care | Attending: Orthopedic Surgery

## 2021-02-08 ENCOUNTER — Observation Stay: Payer: Medicare HMO

## 2021-02-08 ENCOUNTER — Ambulatory Visit: Payer: Medicare HMO | Admitting: Anesthesiology

## 2021-02-08 ENCOUNTER — Observation Stay
Admission: RE | Admit: 2021-02-08 | Discharge: 2021-02-09 | Disposition: A | Discharge status: Home / Self Care | Payer: Medicare HMO | Attending: Orthopedic Surgery | Admitting: Orthopedic Surgery

## 2021-02-08 DIAGNOSIS — D696 Thrombocytopenia, unspecified: Secondary | ICD-10-CM | POA: Diagnosis not present

## 2021-02-08 DIAGNOSIS — M25762 Osteophyte, left knee: Secondary | ICD-10-CM | POA: Insufficient documentation

## 2021-02-08 DIAGNOSIS — Z79899 Other long term (current) drug therapy: Secondary | ICD-10-CM | POA: Diagnosis not present

## 2021-02-08 DIAGNOSIS — Z87891 Personal history of nicotine dependence: Secondary | ICD-10-CM | POA: Insufficient documentation

## 2021-02-08 DIAGNOSIS — Z96652 Presence of left artificial knee joint: Secondary | ICD-10-CM | POA: Diagnosis not present

## 2021-02-08 DIAGNOSIS — I2510 Atherosclerotic heart disease of native coronary artery without angina pectoris: Secondary | ICD-10-CM | POA: Diagnosis not present

## 2021-02-08 DIAGNOSIS — Z96659 Presence of unspecified artificial knee joint: Secondary | ICD-10-CM

## 2021-02-08 DIAGNOSIS — M25562 Pain in left knee: Secondary | ICD-10-CM | POA: Diagnosis present

## 2021-02-08 DIAGNOSIS — I10 Essential (primary) hypertension: Secondary | ICD-10-CM | POA: Diagnosis not present

## 2021-02-08 DIAGNOSIS — M1712 Unilateral primary osteoarthritis, left knee: Secondary | ICD-10-CM | POA: Diagnosis not present

## 2021-02-08 DIAGNOSIS — I25119 Atherosclerotic heart disease of native coronary artery with unspecified angina pectoris: Secondary | ICD-10-CM | POA: Diagnosis not present

## 2021-02-08 DIAGNOSIS — K219 Gastro-esophageal reflux disease without esophagitis: Secondary | ICD-10-CM | POA: Diagnosis not present

## 2021-02-08 HISTORY — PX: KNEE ARTHROPLASTY: SHX992

## 2021-02-08 LAB — ABO/RH: ABO/RH(D): AB POS

## 2021-02-08 SURGERY — ARTHROPLASTY, KNEE, TOTAL, USING IMAGELESS COMPUTER-ASSISTED NAVIGATION
Anesthesia: Spinal | Site: Knee | Laterality: Left

## 2021-02-08 MED ORDER — ACETAMINOPHEN 10 MG/ML IV SOLN
1000.0000 mg | Freq: Four times a day (QID) | INTRAVENOUS | Status: AC
  Administered 2021-02-08 – 2021-02-09 (×4): 1000 mg via INTRAVENOUS
  Filled 2021-02-08 (×4): qty 100

## 2021-02-08 MED ORDER — CELECOXIB 200 MG PO CAPS
ORAL_CAPSULE | ORAL | Status: AC
  Administered 2021-02-08: 400 mg via ORAL
  Filled 2021-02-08: qty 1

## 2021-02-08 MED ORDER — TRANEXAMIC ACID-NACL 1000-0.7 MG/100ML-% IV SOLN
INTRAVENOUS | Status: AC
  Filled 2021-02-08: qty 100

## 2021-02-08 MED ORDER — SODIUM CHLORIDE 0.9 % IV SOLN: INTRAVENOUS | Status: DC

## 2021-02-08 MED ORDER — GABAPENTIN 300 MG PO CAPS
ORAL_CAPSULE | ORAL | Status: AC
  Administered 2021-02-08: 300 mg via ORAL
  Filled 2021-02-08: qty 1

## 2021-02-08 MED ORDER — ORAL CARE MOUTH RINSE: 15.0000 mL | Freq: Once | OROMUCOSAL | Status: AC

## 2021-02-08 MED ORDER — ATENOLOL 25 MG PO TABS
50.0000 mg | ORAL_TABLET | Freq: Every day | ORAL | Status: DC
Start: 2021-02-08 — End: 2021-02-09
  Administered 2021-02-09: 50 mg via ORAL
  Filled 2021-02-08: qty 2

## 2021-02-08 MED ORDER — SODIUM CHLORIDE FLUSH 0.9 % IV SOLN
INTRAVENOUS | Status: AC
  Filled 2021-02-08: qty 40

## 2021-02-08 MED ORDER — GABAPENTIN 300 MG PO CAPS: 300.0000 mg | ORAL_CAPSULE | Freq: Once | ORAL | Status: AC

## 2021-02-08 MED ORDER — PROPOFOL 500 MG/50ML IV EMUL
INTRAVENOUS | Status: DC | PRN
  Administered 2021-02-08: 100 ug/kg/min via INTRAVENOUS

## 2021-02-08 MED ORDER — PROPOFOL 500 MG/50ML IV EMUL
INTRAVENOUS | Status: AC
  Filled 2021-02-08: qty 50

## 2021-02-08 MED ORDER — ACETAMINOPHEN 10 MG/ML IV SOLN
INTRAVENOUS | Status: DC | PRN
  Administered 2021-02-08: 1000 mg via INTRAVENOUS

## 2021-02-08 MED ORDER — MAGNESIUM HYDROXIDE 400 MG/5ML PO SUSP
30.0000 mL | Freq: Every day | ORAL | Status: DC
  Administered 2021-02-08 – 2021-02-09 (×2): 30 mL via ORAL
  Filled 2021-02-08 (×2): qty 30

## 2021-02-08 MED ORDER — SODIUM CHLORIDE 0.9 % IR SOLN
Status: DC | PRN
  Administered 2021-02-08: 1000 mL

## 2021-02-08 MED ORDER — CHLORHEXIDINE GLUCONATE 0.12 % MT SOLN
OROMUCOSAL | Status: AC
  Filled 2021-02-08: qty 15

## 2021-02-08 MED ORDER — NEOMYCIN-POLYMYXIN B GU 40-200000 IR SOLN
Status: AC
  Filled 2021-02-08: qty 20

## 2021-02-08 MED ORDER — PANTOPRAZOLE SODIUM 40 MG PO TBEC
40.0000 mg | DELAYED_RELEASE_TABLET | Freq: Two times a day (BID) | ORAL | Status: DC
  Administered 2021-02-08 – 2021-02-09 (×2): 40 mg via ORAL
  Filled 2021-02-08 (×2): qty 1

## 2021-02-08 MED ORDER — METOCLOPRAMIDE HCL 10 MG PO TABS
10.0000 mg | ORAL_TABLET | Freq: Three times a day (TID) | ORAL | Status: DC
  Administered 2021-02-08 – 2021-02-09 (×4): 10 mg via ORAL
  Filled 2021-02-08 (×4): qty 1

## 2021-02-08 MED ORDER — DEXAMETHASONE SODIUM PHOSPHATE 10 MG/ML IJ SOLN
INTRAMUSCULAR | Status: AC
  Administered 2021-02-08: 8 mg via INTRAVENOUS
  Filled 2021-02-08: qty 1

## 2021-02-08 MED ORDER — TRANEXAMIC ACID-NACL 1000-0.7 MG/100ML-% IV SOLN
1000.0000 mg | Freq: Once | INTRAVENOUS | Status: AC
  Administered 2021-02-08: 1000 mg via INTRAVENOUS

## 2021-02-08 MED ORDER — TRANEXAMIC ACID-NACL 1000-0.7 MG/100ML-% IV SOLN
1000.0000 mg | INTRAVENOUS | Status: AC
  Administered 2021-02-08: 1000 mg via INTRAVENOUS

## 2021-02-08 MED ORDER — ACETAMINOPHEN 325 MG PO TABS: 325.0000 mg | ORAL_TABLET | Freq: Four times a day (QID) | ORAL | Status: DC | PRN

## 2021-02-08 MED ORDER — BUPIVACAINE HCL (PF) 0.5 % IJ SOLN
INTRAMUSCULAR | Status: DC | PRN
  Administered 2021-02-08: 3 mL

## 2021-02-08 MED ORDER — ONDANSETRON HCL 4 MG PO TABS: 4.0000 mg | ORAL_TABLET | Freq: Four times a day (QID) | ORAL | Status: DC | PRN

## 2021-02-08 MED ORDER — TRAMADOL HCL 50 MG PO TABS
50.0000 mg | ORAL_TABLET | ORAL | Status: DC | PRN
  Administered 2021-02-08: 100 mg via ORAL
  Filled 2021-02-08: qty 2

## 2021-02-08 MED ORDER — CELECOXIB 200 MG PO CAPS
ORAL_CAPSULE | ORAL | Status: AC
  Filled 2021-02-08: qty 1

## 2021-02-08 MED ORDER — CHLORHEXIDINE GLUCONATE 0.12 % MT SOLN
15.0000 mL | Freq: Once | OROMUCOSAL | Status: AC
  Administered 2021-02-08: 15 mL via OROMUCOSAL

## 2021-02-08 MED ORDER — FENTANYL CITRATE (PF) 100 MCG/2ML IJ SOLN: 25.0000 ug | INTRAMUSCULAR | Status: DC | PRN

## 2021-02-08 MED ORDER — BUPIVACAINE HCL (PF) 0.5 % IJ SOLN
INTRAMUSCULAR | Status: AC
  Filled 2021-02-08: qty 10

## 2021-02-08 MED ORDER — OXYCODONE HCL 5 MG PO TABS
5.0000 mg | ORAL_TABLET | ORAL | Status: DC | PRN
  Administered 2021-02-09: 5 mg via ORAL
  Filled 2021-02-08: qty 1

## 2021-02-08 MED ORDER — DIPHENHYDRAMINE HCL 12.5 MG/5ML PO ELIX
12.5000 mg | ORAL_SOLUTION | ORAL | Status: DC | PRN
Start: 2021-02-08 — End: 2021-02-09

## 2021-02-08 MED ORDER — PHENOL 1.4 % MT LIQD
1.0000 | OROMUCOSAL | Status: DC | PRN
  Filled 2021-02-08: qty 177

## 2021-02-08 MED ORDER — PROPOFOL 10 MG/ML IV BOLUS
INTRAVENOUS | Status: AC
  Filled 2021-02-08: qty 20

## 2021-02-08 MED ORDER — PROPOFOL 10 MG/ML IV BOLUS
INTRAVENOUS | Status: DC | PRN
  Administered 2021-02-08 (×2): 50 mg via INTRAVENOUS

## 2021-02-08 MED ORDER — CELECOXIB 200 MG PO CAPS: 400.0000 mg | ORAL_CAPSULE | Freq: Once | ORAL | Status: AC

## 2021-02-08 MED ORDER — SURGIPHOR WOUND IRRIGATION SYSTEM - OPTIME
TOPICAL | Status: DC | PRN
  Administered 2021-02-08: 400 mL via TOPICAL

## 2021-02-08 MED ORDER — ALBUTEROL SULFATE HFA 108 (90 BASE) MCG/ACT IN AERS
1.0000 | INHALATION_SPRAY | Freq: Four times a day (QID) | RESPIRATORY_TRACT | Status: DC | PRN
  Filled 2021-02-08: qty 6.7

## 2021-02-08 MED ORDER — BISACODYL 10 MG RE SUPP: 10.0000 mg | Freq: Every day | RECTAL | Status: DC | PRN

## 2021-02-08 MED ORDER — FLEET ENEMA 7-19 GM/118ML RE ENEM: 1.0000 | ENEMA | Freq: Once | RECTAL | Status: DC | PRN

## 2021-02-08 MED ORDER — BUPIVACAINE HCL (PF) 0.25 % IJ SOLN
INTRAMUSCULAR | Status: AC
  Filled 2021-02-08: qty 60

## 2021-02-08 MED ORDER — DEXMEDETOMIDINE (PRECEDEX) IN NS 20 MCG/5ML (4 MCG/ML) IV SYRINGE
PREFILLED_SYRINGE | INTRAVENOUS | Status: DC | PRN
  Administered 2021-02-08: 8 ug via INTRAVENOUS
  Administered 2021-02-08: 4 ug via INTRAVENOUS

## 2021-02-08 MED ORDER — SODIUM CHLORIDE 0.9 % IV SOLN
INTRAVENOUS | Status: DC | PRN
  Administered 2021-02-08: 60 mL

## 2021-02-08 MED ORDER — ENOXAPARIN SODIUM 30 MG/0.3ML ~~LOC~~ SOLN
30.0000 mg | Freq: Two times a day (BID) | SUBCUTANEOUS | Status: DC
  Administered 2021-02-09 (×2): 30 mg via SUBCUTANEOUS
  Filled 2021-02-08 (×2): qty 0.3

## 2021-02-08 MED ORDER — CEFAZOLIN SODIUM-DEXTROSE 2-4 GM/100ML-% IV SOLN
INTRAVENOUS | Status: AC
  Filled 2021-02-08: qty 100

## 2021-02-08 MED ORDER — ONDANSETRON HCL 4 MG/2ML IJ SOLN: 4.0000 mg | Freq: Four times a day (QID) | INTRAMUSCULAR | Status: DC | PRN

## 2021-02-08 MED ORDER — CELECOXIB 200 MG PO CAPS
200.0000 mg | ORAL_CAPSULE | Freq: Two times a day (BID) | ORAL | Status: DC
  Administered 2021-02-08 – 2021-02-09 (×2): 200 mg via ORAL
  Filled 2021-02-08 (×2): qty 1

## 2021-02-08 MED ORDER — DEXMEDETOMIDINE (PRECEDEX) IN NS 20 MCG/5ML (4 MCG/ML) IV SYRINGE
PREFILLED_SYRINGE | INTRAVENOUS | Status: AC
  Filled 2021-02-08: qty 5

## 2021-02-08 MED ORDER — ALUM & MAG HYDROXIDE-SIMETH 200-200-20 MG/5ML PO SUSP: 30.0000 mL | ORAL | Status: DC | PRN

## 2021-02-08 MED ORDER — CHLORHEXIDINE GLUCONATE 4 % EX LIQD
60.0000 mL | Freq: Once | CUTANEOUS | Status: AC
  Administered 2021-02-08: 4 via TOPICAL

## 2021-02-08 MED ORDER — FERROUS SULFATE 325 (65 FE) MG PO TABS
325.0000 mg | ORAL_TABLET | Freq: Two times a day (BID) | ORAL | Status: DC
  Administered 2021-02-08 – 2021-02-09 (×2): 325 mg via ORAL
  Filled 2021-02-08 (×2): qty 1

## 2021-02-08 MED ORDER — ROSUVASTATIN CALCIUM 10 MG PO TABS: 10.0000 mg | ORAL_TABLET | Freq: Every day | ORAL | Status: DC

## 2021-02-08 MED ORDER — SENNOSIDES-DOCUSATE SODIUM 8.6-50 MG PO TABS
1.0000 | ORAL_TABLET | Freq: Two times a day (BID) | ORAL | Status: DC
  Administered 2021-02-08 – 2021-02-09 (×2): 1 via ORAL
  Filled 2021-02-08 (×2): qty 1

## 2021-02-08 MED ORDER — SODIUM CHLORIDE 0.9 % IV SOLN
INTRAVENOUS | Status: DC | PRN
  Administered 2021-02-08: 30 ug/min via INTRAVENOUS

## 2021-02-08 MED ORDER — CEFAZOLIN SODIUM-DEXTROSE 2-4 GM/100ML-% IV SOLN
2.0000 g | INTRAVENOUS | Status: AC
  Administered 2021-02-08: 2 g via INTRAVENOUS

## 2021-02-08 MED ORDER — HYDROMORPHONE HCL 1 MG/ML IJ SOLN: 0.5000 mg | INTRAMUSCULAR | Status: DC | PRN

## 2021-02-08 MED ORDER — ACETAMINOPHEN 10 MG/ML IV SOLN
INTRAVENOUS | Status: AC
  Filled 2021-02-08: qty 100

## 2021-02-08 MED ORDER — OXYCODONE HCL 5 MG/5ML PO SOLN: 5.0000 mg | Freq: Once | ORAL | Status: DC | PRN

## 2021-02-08 MED ORDER — OXYCODONE HCL 5 MG PO TABS: 5.0000 mg | ORAL_TABLET | Freq: Once | ORAL | Status: DC | PRN

## 2021-02-08 MED ORDER — LACTATED RINGERS IV SOLN: INTRAVENOUS | Status: DC

## 2021-02-08 MED ORDER — BUPIVACAINE HCL (PF) 0.25 % IJ SOLN
INTRAMUSCULAR | Status: DC | PRN
  Administered 2021-02-08: 60 mL

## 2021-02-08 MED ORDER — PHENYLEPHRINE HCL (PRESSORS) 10 MG/ML IV SOLN
INTRAVENOUS | Status: AC
  Filled 2021-02-08: qty 1

## 2021-02-08 MED ORDER — BUPIVACAINE LIPOSOME 1.3 % IJ SUSP
INTRAMUSCULAR | Status: AC
  Filled 2021-02-08: qty 20

## 2021-02-08 MED ORDER — MENTHOL 3 MG MT LOZG
1.0000 | LOZENGE | OROMUCOSAL | Status: DC | PRN
  Filled 2021-02-08: qty 9

## 2021-02-08 MED ORDER — DEXAMETHASONE SODIUM PHOSPHATE 10 MG/ML IJ SOLN: 8.0000 mg | Freq: Once | INTRAMUSCULAR | Status: AC

## 2021-02-08 MED ORDER — OXYCODONE HCL 5 MG PO TABS
10.0000 mg | ORAL_TABLET | ORAL | Status: DC | PRN
  Administered 2021-02-08 – 2021-02-09 (×3): 10 mg via ORAL
  Filled 2021-02-08 (×3): qty 2

## 2021-02-08 MED ORDER — CEFAZOLIN SODIUM-DEXTROSE 2-4 GM/100ML-% IV SOLN
2.0000 g | Freq: Four times a day (QID) | INTRAVENOUS | Status: AC
  Administered 2021-02-08 (×2): 2 g via INTRAVENOUS
  Filled 2021-02-08 (×2): qty 100

## 2021-02-08 SURGICAL SUPPLY — 78 items
ATTUNE MED DOME PAT 38 KNEE (Knees) ×1 IMPLANT
ATTUNE PS FEM LT SZ 6 CEM KNEE (Femur) ×1 IMPLANT
ATTUNE PSRP INSR SZ6 7 KNEE (Insert) ×1 IMPLANT
BASE TIBIAL ROT PLAT SZ 7 KNEE (Knees) IMPLANT
BATTERY INSTRU NAVIGATION (MISCELLANEOUS) ×8 IMPLANT
BLADE SAW 70X12.5 (BLADE) ×2 IMPLANT
BLADE SAW 90X13X1.19 OSCILLAT (BLADE) ×2 IMPLANT
BLADE SAW 90X25X1.19 OSCILLAT (BLADE) ×2 IMPLANT
BOWL CEMENT MIXING ADV NOZZLE (MISCELLANEOUS) ×1 IMPLANT
CANISTER PREVENA PLUS 150 (CANNISTER) ×1 IMPLANT
CANISTER SUCT 3000ML PPV (MISCELLANEOUS) ×1 IMPLANT
CEMENT HV SMART SET (Cement) ×3 IMPLANT
COOLER POLAR GLACIER W/PUMP (MISCELLANEOUS) ×2 IMPLANT
COVER WAND RF STERILE (DRAPES) ×2 IMPLANT
CUFF TOURN SGL QUICK 24 (TOURNIQUET CUFF) ×1
CUFF TOURN SGL QUICK 30 (TOURNIQUET CUFF)
CUFF TRNQT CYL 24X4X16.5-23 (TOURNIQUET CUFF) IMPLANT
CUFF TRNQT CYL 30X4X21-28X (TOURNIQUET CUFF) IMPLANT
DRAPE 3/4 80X56 (DRAPES) ×2 IMPLANT
DRESSING PEEL AND PLAC PRVNA20 (GAUZE/BANDAGES/DRESSINGS) ×1 IMPLANT
DRSG DERMACEA 8X12 NADH (GAUZE/BANDAGES/DRESSINGS) ×2 IMPLANT
DRSG MEPILEX SACRM 8.7X9.8 (GAUZE/BANDAGES/DRESSINGS) ×2 IMPLANT
DRSG OPSITE POSTOP 4X14 (GAUZE/BANDAGES/DRESSINGS) ×2 IMPLANT
DRSG PEEL AND PLACE PREVENA 20 (GAUZE/BANDAGES/DRESSINGS) ×2
DRSG TEGADERM 4X4.75 (GAUZE/BANDAGES/DRESSINGS) ×2 IMPLANT
DURAPREP 26ML APPLICATOR (WOUND CARE) ×4 IMPLANT
ELECT REM PT RETURN 9FT ADLT (ELECTROSURGICAL) ×2
ELECTRODE REM PT RTRN 9FT ADLT (ELECTROSURGICAL) ×1 IMPLANT
EX-PIN ORTHOLOCK NAV 4X150 (PIN) ×4 IMPLANT
GLOVE SURG ENC MOIS LTX SZ7.5 (GLOVE) ×4 IMPLANT
GLOVE SURG ENC TEXT LTX SZ7.5 (GLOVE) ×4 IMPLANT
GLOVE SURG UNDER LTX SZ8 (GLOVE) ×2 IMPLANT
GLOVE SURG UNDER POLY LF SZ7.5 (GLOVE) ×2 IMPLANT
GOWN STRL REUS W/ TWL LRG LVL3 (GOWN DISPOSABLE) ×2 IMPLANT
GOWN STRL REUS W/ TWL XL LVL3 (GOWN DISPOSABLE) ×1 IMPLANT
GOWN STRL REUS W/TWL LRG LVL3 (GOWN DISPOSABLE) ×2
GOWN STRL REUS W/TWL XL LVL3 (GOWN DISPOSABLE) ×1
HEMOVAC 400CC 10FR (MISCELLANEOUS) ×2 IMPLANT
HOLDER FOLEY CATH W/STRAP (MISCELLANEOUS) ×2 IMPLANT
HOOD PEEL AWAY FLYTE STAYCOOL (MISCELLANEOUS) ×4 IMPLANT
IRRIGATION SURGIPHOR STRL (IV SOLUTION) ×2 IMPLANT
KIT PUMP PREVENA PLUS 14DAY (MISCELLANEOUS) ×1 IMPLANT
KIT TURNOVER KIT A (KITS) ×2 IMPLANT
KNIFE SCULPS 14X20 (INSTRUMENTS) ×2 IMPLANT
LABEL OR SOLS (LABEL) ×2 IMPLANT
MANIFOLD NEPTUNE II (INSTRUMENTS) ×4 IMPLANT
NDL SAFETY ECLIPSE 18X1.5 (NEEDLE) ×1 IMPLANT
NDL SPNL 20GX3.5 QUINCKE YW (NEEDLE) ×2 IMPLANT
NEEDLE HYPO 18GX1.5 SHARP (NEEDLE) ×1
NEEDLE SPNL 20GX3.5 QUINCKE YW (NEEDLE) ×4 IMPLANT
NS IRRIG 500ML POUR BTL (IV SOLUTION) ×2 IMPLANT
PACK TOTAL KNEE (MISCELLANEOUS) ×2 IMPLANT
PAD WRAPON POLAR KNEE (MISCELLANEOUS) ×1 IMPLANT
PENCIL SMOKE EVACUATOR (MISCELLANEOUS) ×2 IMPLANT
PIN DRILL QUICK PACK ×2 IMPLANT
PIN FIXATION 1/8DIA X 3INL (PIN) ×6 IMPLANT
PULSAVAC PLUS IRRIG FAN TIP (DISPOSABLE) ×2
SOL .9 NS 3000ML IRR  AL (IV SOLUTION) ×1
SOL .9 NS 3000ML IRR UROMATIC (IV SOLUTION) ×1 IMPLANT
SOL PREP PVP 2OZ (MISCELLANEOUS) ×2
SOLUTION PREP PVP 2OZ (MISCELLANEOUS) ×1 IMPLANT
SPONGE DRAIN TRACH 4X4 STRL 2S (GAUZE/BANDAGES/DRESSINGS) ×2 IMPLANT
STAPLER SKIN PROX 35W (STAPLE) ×2 IMPLANT
STOCKINETTE IMPERV 14X48 (MISCELLANEOUS) IMPLANT
STRAP TIBIA SHORT (MISCELLANEOUS) ×2 IMPLANT
SUCTION FRAZIER HANDLE 10FR (MISCELLANEOUS) ×1
SUCTION TUBE FRAZIER 10FR DISP (MISCELLANEOUS) ×1 IMPLANT
SUT VIC AB 0 CT1 36 (SUTURE) ×4 IMPLANT
SUT VIC AB 1 CT1 36 (SUTURE) ×4 IMPLANT
SUT VIC AB 2-0 CT2 27 (SUTURE) ×2 IMPLANT
SYR 20ML LL LF (SYRINGE) ×2 IMPLANT
SYR 30ML LL (SYRINGE) ×4 IMPLANT
TIBIAL BASE ROT PLAT SZ 7 KNEE (Knees) ×2 IMPLANT
TIP FAN IRRIG PULSAVAC PLUS (DISPOSABLE) ×1 IMPLANT
TOWEL OR 17X26 4PK STRL BLUE (TOWEL DISPOSABLE) ×2 IMPLANT
TOWER CARTRIDGE SMART MIX (DISPOSABLE) ×2 IMPLANT
TRAY FOLEY MTR SLVR 16FR STAT (SET/KITS/TRAYS/PACK) ×2 IMPLANT
WRAPON POLAR PAD KNEE (MISCELLANEOUS) ×2

## 2021-02-08 NOTE — Anesthesia Preprocedure Evaluation (Signed)
Anesthesia Evaluation  Patient identified by MRN, date of birth, ID band Patient awake    Reviewed: Allergy & Precautions, H&P , NPO status , Patient's Chart, lab work & pertinent test results  History of Anesthesia Complications Negative for: history of anesthetic complications  Airway Mallampati: III  TM Distance: >3 FB Neck ROM: full    Dental  (+) Missing   Pulmonary neg shortness of breath, sleep apnea , former smoker,    Pulmonary exam normal        Cardiovascular Exercise Tolerance: Good hypertension, (-) angina+ CAD and + Past MI  Normal cardiovascular exam     Neuro/Psych negative neurological ROS  negative psych ROS   GI/Hepatic Neg liver ROS, GERD  Medicated and Controlled,  Endo/Other  negative endocrine ROS  Renal/GU      Musculoskeletal  (+) Arthritis ,   Abdominal   Peds  Hematology negative hematology ROS (+)   Anesthesia Other Findings Past Medical History: No date: Coronary artery disease No date: GERD (gastroesophageal reflux disease) No date: History of kidney stones No date: Hypertension 01/31/2016: Nuclear sclerotic cataract of right eye 09/16/2020: Osteoarthritis of left knee  Past Surgical History: 1990's: CARDIAC CATHETERIZATION     Comment:  POBA 2019: CATARACT EXTRACTION W/ INTRAOCULAR LENS  IMPLANT, BILATERAL;  Bilateral 2018: COLONOSCOPY 1976: KNEE ARTHROSCOPY; Left 1960: TONSILLECTOMY     Reproductive/Obstetrics negative OB ROS                             Anesthesia Physical Anesthesia Plan  ASA: III  Anesthesia Plan: Spinal   Post-op Pain Management:    Induction:   PONV Risk Score and Plan:   Airway Management Planned: Natural Airway and Nasal Cannula  Additional Equipment:   Intra-op Plan:   Post-operative Plan:   Informed Consent: I have reviewed the patients History and Physical, chart, labs and discussed the procedure  including the risks, benefits and alternatives for the proposed anesthesia with the patient or authorized representative who has indicated his/her understanding and acceptance.     Dental Advisory Given  Plan Discussed with: Anesthesiologist, CRNA and Surgeon  Anesthesia Plan Comments: (Patient reports no bleeding problems and no anticoagulant use.  Plan for spinal with backup GA  Patient consented for risks of anesthesia including but not limited to:  - adverse reactions to medications - damage to eyes, teeth, lips or other oral mucosa - nerve damage due to positioning  - risk of bleeding, infection and or nerve damage from spinal that could lead to paralysis - risk of headache or failed spinal - damage to teeth, lips or other oral mucosa - sore throat or hoarseness - damage to heart, brain, nerves, lungs, other parts of body or loss of life  Patient voiced understanding.)        Anesthesia Quick Evaluation

## 2021-02-08 NOTE — Transfer of Care (Signed)
Immediate Anesthesia Transfer of Care Note  Patient: Luke Wall  Procedure(s) Performed: COMPUTER ASSISTED TOTAL KNEE ARTHROPLASTY - RNFA (Left Knee)  Patient Location: PACU  Anesthesia Type:Spinal  Level of Consciousness: drowsy and patient cooperative  Airway & Oxygen Therapy: Patient Spontanous Breathing and Patient connected to nasal cannula oxygen  Post-op Assessment: Report given to RN and Post -op Vital signs reviewed and stable  Post vital signs: Reviewed and stable  Last Vitals:  Vitals Value Taken Time  BP 101/69 02/08/21 1114  Temp    Pulse 60 02/08/21 1115  Resp 19 02/08/21 1115  SpO2 100 % 02/08/21 1115  Vitals shown include unvalidated device data.  Last Pain:  Vitals:   02/08/21 0609  TempSrc: Temporal  PainSc: 0-No pain         Complications: No complications documented.

## 2021-02-08 NOTE — H&P (Signed)
The patient has been re-examined, and the chart reviewed, and there have been no interval changes to the documented history and physical.    The risks, benefits, and alternatives have been discussed at length. The patient expressed understanding of the risks benefits and agreed with plans for surgical intervention.  Bliss Tsang P. Zaylie Gisler, Jr. M.D.    

## 2021-02-08 NOTE — Op Note (Signed)
OPERATIVE NOTE  DATE OF SURGERY:  02/08/2021  PATIENT NAME:  Luke Wall   DOB: [birth year]  MRN: [id number]  PRE-OPERATIVE DIAGNOSIS: Degenerative arthrosis of the left knee, primary  POST-OPERATIVE DIAGNOSIS:  Same  PROCEDURE:  Left total knee arthroplasty using computer-assisted navigation  SURGEON:  Marciano Sequin. M.D.  ANESTHESIA: spinal  ESTIMATED BLOOD LOSS: 50 mL  FLUIDS REPLACED: 700 mL of crystalloid  TOURNIQUET TIME: 101 minutes  DRAINS: 2 medium Hemovac drains  SOFT TISSUE RELEASES: Anterior cruciate ligament, posterior cruciate ligament, deep medial collateral ligament, patellofemoral ligament  IMPLANTS UTILIZED: DePuy Attune size 6 posterior stabilized femoral component (cemented), size 7 rotating platform tibial component (cemented), 38 mm medialized dome patella (cemented), and a 7 mm stabilized rotating platform polyethylene insert.  INDICATIONS FOR SURGERY: Luke Wall is a 75 y.o. year old male with a long history of progressive knee pain. X-rays demonstrated severe degenerative changes in tricompartmental fashion. The patient had not seen any significant improvement despite conservative nonsurgical intervention. After discussion of the risks and benefits of surgical intervention, the patient expressed understanding of the risks benefits and agree with plans for total knee arthroplasty.   The risks, benefits, and alternatives were discussed at length including but not limited to the risks of infection, bleeding, nerve injury, stiffness, blood clots, the need for revision surgery, cardiopulmonary complications, among others, and they were willing to proceed.  PROCEDURE IN DETAIL: The patient was brought into the operating room and, after adequate spinal anesthesia was achieved, a tourniquet was placed on the patient's upper thigh. The patient's knee and leg were cleaned and prepped with alcohol and DuraPrep and draped in the usual sterile fashion. A "timeout"  was performed as per usual protocol. The lower extremity was exsanguinated using an Esmarch, and the tourniquet was inflated to 300 mmHg. An anterior longitudinal incision was made followed by a standard mid vastus approach. The deep fibers of the medial collateral ligament were elevated in a subperiosteal fashion off of the medial flare of the tibia so as to maintain a continuous soft tissue sleeve. The patella was subluxed laterally and the patellofemoral ligament was incised. Inspection of the knee demonstrated severe degenerative changes with full-thickness loss of articular cartilage. Osteophytes were debrided using a rongeur. Anterior and posterior cruciate ligaments were excised. Two 4.0 mm Schanz pins were inserted in the femur and into the tibia for attachment of the array of trackers used for computer-assisted navigation. Hip center was identified using a circumduction technique. Distal landmarks were mapped using the computer. The distal femur and proximal tibia were mapped using the computer. The distal femoral cutting guide was positioned using computer-assisted navigation so as to achieve a 5 distal valgus cut. The femur was sized and it was felt that a size 6 femoral component was appropriate. A size 6 femoral cutting guide was positioned and the anterior cut was performed and verified using the computer. This was followed by completion of the posterior and chamfer cuts. Femoral cutting guide for the central box was then positioned in the center box cut was performed.  Attention was then directed to the proximal tibia. Medial and lateral menisci were excised. The extramedullary tibial cutting guide was positioned using computer-assisted navigation so as to achieve a 0 varus-valgus alignment and 3 posterior slope. The cut was performed and verified using the computer. The proximal tibia was sized and it was felt that a size 7 tibial tray was appropriate. Tibial and femoral trials were inserted  followed  by insertion of a 7 mm polyethylene insert. This allowed for excellent mediolateral soft tissue balancing both in flexion and in full extension. Finally, the patella was cut and prepared so as to accommodate a 38 mm medialized dome patella. A patella trial was placed and the knee was placed through a range of motion with excellent patellar tracking appreciated. The femoral trial was removed after debridement of posterior osteophytes. The central post-hole for the tibial component was reamed followed by insertion of a keel punch. Tibial trials were then removed. Cut surfaces of bone were irrigated with copious amounts of normal saline using pulsatile lavage and then suctioned dry. Polymethylmethacrylate cement was prepared in the usual fashion using a vacuum mixer. Cement was applied to the cut surface of the proximal tibia as well as along the undersurface of a size 7 rotating platform tibial component. Tibial component was positioned and impacted into place. Excess cement was removed using [employer] fast streamer. Cement was then applied to the cut surfaces of the femur as well as along the posterior flanges of the size 6 femoral component. The femoral component was positioned and impacted into place. Excess cement was removed using [employer] fast streamer. A 7 mm polyethylene trial was inserted and the knee was brought into full extension with steady axial compression applied. Finally, cement was applied to the backside of a 38 mm medialized dome patella and the patellar component was positioned and patellar clamp applied. Excess cement was removed using [employer] fast streamer. After adequate curing of the cement, the tourniquet was deflated after a total tourniquet time of 101 minutes. Hemostasis was achieved using electrocautery. The knee was irrigated with copious amounts of normal saline using pulsatile lavage followed by 500 ml of Surgiphor and then suctioned dry. 20 mL of 1.3% Exparel and 60 mL of 0.25% Marcaine in 40 mL  of normal saline was injected along the posterior capsule, medial and lateral gutters, and along the arthrotomy site. A 7 mm stabilized rotating platform polyethylene insert was inserted and the knee was placed through a range of motion with excellent mediolateral soft tissue balancing appreciated and excellent patellar tracking noted. 2 medium drains were placed in the wound bed and brought out through separate stab incisions. The medial parapatellar portion of the incision was reapproximated using interrupted sutures of #1 Vicryl. Subcutaneous tissue was approximated in layers using first #0 Vicryl followed #2-0 Vicryl. The skin was approximated with skin staples. A sterile dressing was applied.  The patient tolerated the procedure well and was transported to the recovery room in stable condition.    Burgess Sheriff P. Holley Bouche., M.D.
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
## 2021-02-08 NOTE — Progress Notes (Signed)
   02/08/21 1220  Clinical Encounter Type  Visited With Patient and family together  Visit Type Initial  Referral From Nurse  Consult/Referral To Herminie responded to an OR in room 1A-138A Pt, Mr. Luke Wall. Pt's girlfriend wanted to do an Forensic scientist. I did the education part and advised both of them the sooner they complete it we may be able to get it done for them today. I also advised Pt and girlfriend to have the nurse page the on-call Chaplain when they are ready.

## 2021-02-08 NOTE — Anesthesia Procedure Notes (Signed)
Spinal  Patient location during procedure: OR Start time: 02/08/2021 7:20 AM End time: 02/08/2021 7:26 AM Staffing Performed: resident/CRNA  Anesthesiologist: Piscitello, Precious Haws, MD Resident/CRNA: Jonna Clark, CRNA Preanesthetic Checklist Completed: patient identified, IV checked, site marked, risks and benefits discussed, surgical consent, monitors and equipment checked, pre-op evaluation and timeout performed Spinal Block Patient position: sitting (supported by patient advocate) Prep: ChloraPrep Patient monitoring: continuous pulse ox and blood pressure Approach: midline Location: L3-4 Injection technique: single-shot Needle Needle type: [place]  Needle gauge: 25 G Needle length: 9 cm Assessment Sensory level: T8 Events: CSF return

## 2021-02-09 DIAGNOSIS — I10 Essential (primary) hypertension: Secondary | ICD-10-CM | POA: Diagnosis not present

## 2021-02-09 DIAGNOSIS — M25762 Osteophyte, left knee: Secondary | ICD-10-CM | POA: Diagnosis not present

## 2021-02-09 DIAGNOSIS — I2510 Atherosclerotic heart disease of native coronary artery without angina pectoris: Secondary | ICD-10-CM | POA: Diagnosis not present

## 2021-02-09 DIAGNOSIS — M1712 Unilateral primary osteoarthritis, left knee: Secondary | ICD-10-CM | POA: Diagnosis not present

## 2021-02-09 DIAGNOSIS — Z87891 Personal history of nicotine dependence: Secondary | ICD-10-CM | POA: Diagnosis not present

## 2021-02-09 DIAGNOSIS — Z79899 Other long term (current) drug therapy: Secondary | ICD-10-CM | POA: Diagnosis not present

## 2021-02-09 MED ORDER — OXYCODONE HCL 5 MG PO TABS: 5.0000 mg | ORAL_TABLET | ORAL | 0 refills | Status: DC | PRN

## 2021-02-09 MED ORDER — CELECOXIB 200 MG PO CAPS: 200.0000 mg | ORAL_CAPSULE | Freq: Two times a day (BID) | ORAL | 0 refills | Status: DC

## 2021-02-09 MED ORDER — ENOXAPARIN SODIUM 40 MG/0.4ML ~~LOC~~ SOLN: 40.0000 mg | SUBCUTANEOUS | 0 refills | Status: DC

## 2021-02-09 NOTE — TOC Initial Note (Signed)
Transition of Care [facility]) - Initial/Assessment Note    Patient Details  Name: Luke Wall MRN: [id number] Date of Birth: 1946-07-19  [facility] CM/SW Contact:    Magnus Ivan, LCSW Phone Number: 02/09/2021, 10:53 AM  Clinical Narrative:                CSW met with patient and spouse at bedside to discuss discharge planning. Patient lives with his wife. Wife will provide transportation. PCP is Dr. Kary Kos. Pharmacy is [facility]. Per notes, patient may DC today pending PT and having a bowel movement. Patient and wife are aware. Patient is agreeable to 3 in 1 and RW. DME ordered through [facility] and will be delivered to bedside prior to DC. Patient agreeable to HHPT through [facility] which was arranged by [employer]. No additional TOC needs identified prior to DC.   Expected Discharge Plan: [place] Barriers to Discharge: Barriers Resolved   Patient Goals and CMS Choice Patient states their goals for this hospitalization and ongoing recovery are:: home with home health CMS Medicare.gov Compare Post Acute Care list provided to:: Patient Choice offered to / list presented to : [facility]  Expected Discharge Plan and Services Expected Discharge Plan: [place]       Living arrangements for the past 2 months: [place]                 DME Arranged: 3-N-1,Walker rolling DME Agency:  Celesta Aver) Date DME Agency Contacted: 02/09/21   Representative spoke with at DME Agency: Melene Muller HH Arranged: PT [place] Agency: [facility] [employer]) Date [place]: 02/09/21   Representative spoke with at [place]: Drue Novel  Prior Living Arrangements/Services Living arrangements for the past 2 months: [place] with:: Spouse Patient language and need for interpreter reviewed:: Yes Do you feel safe going back to the place where you live?: Yes      Need for Family  Participation in Patient Care: Yes (Comment) Care giver support system in place?: Yes (comment)   Criminal Activity/Legal Involvement Pertinent to Current Situation/Hospitalization: No - Comment as needed  Activities of Daily Living Home Assistive Devices/Equipment: Cane (specify quad or straight),Dentures (specify type) ADL Screening (condition at time of admission) Patient's cognitive ability adequate to safely complete daily activities?: Yes Is the patient deaf or have difficulty hearing?: No Does the patient have difficulty seeing, even when wearing glasses/contacts?: No Does the patient have difficulty concentrating, remembering, or making decisions?: No Patient able to express need for assistance with ADLs?: Yes Does the patient have difficulty dressing or bathing?: Yes Independently performs ADLs?: Yes (appropriate for developmental age) Does the patient have difficulty walking or climbing stairs?: Yes Weakness of Legs: Left Weakness of Arms/Hands: None  Permission Sought/Granted Permission sought to share information with : Facility Retail banker granted to share information with : Yes, Verbal Permission Granted     Permission granted to share info w AGENCY: HH, DME agencies        Emotional Assessment       Orientation: : Oriented to Self,Oriented to Place,Oriented to  Time,Oriented to Situation Alcohol / Substance Use: Not Applicable Psych Involvement: No (comment)  Admission diagnosis:  Total knee replacement status [Z96.659] Patient Active Problem List   Diagnosis Date Noted  . 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 Primary osteoarthritis of left knee 09/16/2020  . Coronary artery disease involving native coronary  artery of native heart with angina pectoris ([place]) 08/09/2019  . Thrombocytopenia ([place]) 05/31/2019  . Class 1 obesity due to excess calories with serious comorbidity and body mass index (BMI) of 31.0 to 31.9 in  adult 11/23/2018  . Gastroesophageal reflux disease without esophagitis 11/23/2018  . Impaired fasting glucose 09/17/2017  . Essential hypertension 12/23/2016  . Nuclear sclerotic cataract of right eye 01/31/2016   PCP:  [place] Pink, MD Pharmacy:   [facility] [place], [place] - [address] AT [address] [place] [address] Phone: [phone] Fax: [phone]     Social Determinants of Health (SDOH) Interventions    Readmission Risk Interventions No flowsheet data found.

## 2021-02-09 NOTE — Plan of Care (Signed)

## 2021-02-09 NOTE — Anesthesia Postprocedure Evaluation (Signed)
Anesthesia Post Note  Patient: Luke Wall  Procedure(s) Performed: COMPUTER ASSISTED TOTAL KNEE ARTHROPLASTY - RNFA (Left Knee)  Patient location during evaluation: Nursing Unit Anesthesia Type: Spinal Level of consciousness: oriented and awake and alert Pain management: pain level controlled Vital Signs Assessment: post-procedure vital signs reviewed and stable Respiratory status: spontaneous breathing, respiratory function stable and patient connected to nasal cannula oxygen Cardiovascular status: blood pressure returned to baseline and stable Postop Assessment: no headache, no backache and no apparent nausea or vomiting Anesthetic complications: no   No complications documented.   Last Vitals:  Vitals:   02/09/21 0507 02/09/21 0831  BP: 116/75 (!) 175/87  Pulse: (!) 51 (!) 53  Resp: 18 19  Temp: 36.4 C 36.4 C  SpO2: 100% 98%    Last Pain:  Vitals:   02/09/21 1023  TempSrc:   PainSc: 2                  Arita Miss

## 2021-02-09 NOTE — Evaluation (Signed)
Occupational Therapy Evaluation Patient Details Name: PACER DORN MRN: [id number] DOB: [birth year] Today's Date: 02/09/2021    History of Present Illness Pt is a 75 yo male diagnosed with degenerative arthrosis of the left knee and is s/p elective L TKA.  PMH includes: HTN, CAD, GERD, and MI.   Clinical Impression   Pt. Is a 75 y.o. male who was admitted to [facility] for [place]. Pt. Resides at home with his wife. Pt. was independent with ADLs, and IADL functioning: including meal preparation as needed, and medication management. Pt. was able to drive, is a retired Furniture conservator/restorer. Pt. Education was provided about A/E use for LE ADLs. Pt. reports having a reacher, and sockaide available at home. Pt. is planning to return home today with his wife to assist as needed. No follow-up OT services are warranted at this time. Will complete the OT order.    Follow Up Recommendations  No OT follow up    Equipment Recommendations       Recommendations for Other Services       Precautions / Restrictions Precautions Precautions: Fall Restrictions Weight Bearing Restrictions: Yes LLE Weight Bearing: Weight bearing as tolerated Other Position/Activity Restrictions: Pt completing SLRs with no quad lag      Mobility Bed Mobility Overal bed mobility: Pt. Up in chair upon arrival                  Transfers Overall transfer level: Needs assistance Equipment used: Rolling walker (2 wheeled) Transfers: Sit to/from Stand Sit to Stand: Min guard             Balance                       ADL either performed or assessed with clinical judgement   ADL Overall ADL's : Needs assistance/impaired Eating/Feeding: Independent   Grooming: Independent           Upper Body Dressing : Independent   Lower Body Dressing: Minimal assistance               Functional mobility during ADLs: Min guard       Vision Baseline Vision/History: No visual deficits       Perception      Praxis      Pertinent Vitals/Pain Pain Assessment: 0-10 Pain Score: 2  Pain Location: Left Knee Pain Descriptors / Indicators: Sore Pain Intervention(s): Monitored during session     Hand Dominance Right   Extremity/Trunk Assessment     Lower Extremity Assessment Lower Extremity Assessment: Overall WFL for tasks assessed       Communication Communication Communication: No difficulties   Cognition Arousal/Alertness: Awake/alert Behavior During Therapy: WFL for tasks assessed/performed Overall Cognitive Status: Within Functional Limits for tasks assessed                                     General Comments       Exercises    Shoulder Instructions      Home Living Family/patient expects to be discharged to:: Private residence Living Arrangements: Spouse/significant other Available Help at Discharge: Family;Available 24 hours/day Type of Home: House Home Access: Stairs to enter [employer] of Steps: 2 Entrance Stairs-Rails: Right;Left;Can reach both Home Layout: One level     Bathroom Shower/Tub: Teacher, early years/pre: Handicapped height     Home Equipment: Cane - single point;Shower seat  Prior Functioning/Environment Level of Independence: Independent with assistive device(s)        Comments: Independent ADLs, and IADLs        OT Problem List: Decreased range of motion      OT Treatment/Interventions:      OT Goals(Current goals can be found in the care plan section) Acute Rehab OT Goals Patient Stated Goal: To return home at start outpatient PT OT Goal Formulation: With patient Potential to Achieve Goals: Good  OT Frequency:     Barriers to D/C:            Co-evaluation              AM-PAC OT "6 Clicks" Daily Activity     Outcome Measure Help from another person eating meals?: None Help from another person taking care of personal grooming?: None Help from another person  toileting, which includes using toliet, bedpan, or urinal?: None Help from another person bathing (including washing, rinsing, drying)?: A Little Help from another person to put on and taking off regular upper body clothing?: None Help from another person to put on and taking off regular lower body clothing?: A Little 6 Click Score: 22   End of Session Equipment Utilized During Treatment: Gait belt  Activity Tolerance: Patient tolerated treatment well Patient left: in chair;with chair alarm set  OT Visit Diagnosis: Muscle weakness (generalized) (M62.81)                Time: 1660-6301 OT Time Calculation (min): 22 min Charges:  OT General Charges $OT Visit: 1 Visit OT Evaluation $OT Eval Low Complexity: 1 Low  Harrel Carina, MS, OTR/L   Harrel Carina 02/09/2021, 12:32 PM

## 2021-02-09 NOTE — Plan of Care (Signed)
  Problem: Education: Goal: Knowledge of General Education information will improve Description: Including pain rating scale, medication(s)/side effects and non-pharmacologic comfort measures 02/09/2021 1410 by Alisah Grandberry Bet, LPN Outcome: Adequate for Discharge 02/09/2021 1120 by Granite Godman Bet, LPN Outcome: Progressing   Problem: Health Behavior/Discharge Planning: Goal: Ability to manage health-related needs will improve 02/09/2021 1410 by Nakota Ackert Bet, LPN Outcome: Adequate for Discharge 02/09/2021 1120 by Nasim Garofano Bet, LPN Outcome: Progressing   Problem: Clinical Measurements: Goal: Ability to maintain clinical measurements within normal limits will improve 02/09/2021 1410 by Lorah Kalina Bet, LPN Outcome: Adequate for Discharge 02/09/2021 1120 by Natnael Biederman Bet, LPN Outcome: Progressing Goal: Will remain free from infection 02/09/2021 1410 by Rickiya Picariello Bet, LPN Outcome: Adequate for Discharge 02/09/2021 1120 by Janaisa Birkland Bet, LPN Outcome: Progressing Goal: Diagnostic test results will improve 02/09/2021 1410 by Talene Glastetter Bet, LPN Outcome: Adequate for Discharge 02/09/2021 1120 by Ilyssa Grennan Bet, LPN Outcome: Progressing Goal: Respiratory complications will improve 02/09/2021 1410 by Sheli Dorin Bet, LPN Outcome: Adequate for Discharge 02/09/2021 1120 by Romello Hoehn Bet, LPN Outcome: Progressing Goal: Cardiovascular complication will be avoided 02/09/2021 1410 by Evea Sheek Bet, LPN Outcome: Adequate for Discharge 02/09/2021 1120 by Lavaun Greenfield Bet, LPN Outcome: Progressing   Problem: Activity: Goal: Risk for activity intolerance will decrease 02/09/2021 1410 by Abrielle Finck Bet, LPN Outcome: Adequate for Discharge 02/09/2021 1120 by Zaidy Absher Bet, LPN Outcome: Progressing   Problem: Nutrition: Goal: Adequate nutrition will be maintained 02/09/2021 1410 by Thaer Miyoshi Bet, LPN Outcome: Adequate for Discharge 02/09/2021 1120 by Sadi Arave Bet, LPN Outcome: Progressing    Problem: Coping: Goal: Level of anxiety will decrease 02/09/2021 1410 by Prescott Truex Bet, LPN Outcome: Adequate for Discharge 02/09/2021 1120 by Revel Stellmach Bet, LPN Outcome: Progressing   Problem: Elimination: Goal: Will not experience complications related to bowel motility 02/09/2021 1410 by Akif Weldy Bet, LPN Outcome: Adequate for Discharge 02/09/2021 1120 by Tamina Cyphers Bet, LPN Outcome: Progressing Goal: Will not experience complications related to urinary retention 02/09/2021 1410 by Donterrius Santucci Bet, LPN Outcome: Adequate for Discharge 02/09/2021 1120 by Bow Buntyn Bet, LPN Outcome: Progressing   Problem: Pain Managment: Goal: General experience of comfort will improve 02/09/2021 1410 by Thaily Hackworth Bet, LPN Outcome: Adequate for Discharge 02/09/2021 1120 by Lajuana Patchell Bet, LPN Outcome: Progressing   Problem: Safety: Goal: Ability to remain free from injury will improve 02/09/2021 1410 by Staisha Winiarski Bet, LPN Outcome: Adequate for Discharge 02/09/2021 1120 by Zoua Caporaso Bet, LPN Outcome: Progressing   Problem: Skin Integrity: Goal: Risk for impaired skin integrity will decrease 02/09/2021 1410 by Amon Costilla Bet, LPN Outcome: Adequate for Discharge 02/09/2021 1120 by Ronnett Pullin Bet, LPN Outcome: Progressing   Problem: Acute Rehab PT Goals(only PT should resolve) Goal: Pt Will Go Sit To Supine/Side Outcome: Adequate for Discharge Goal: Pt Will Transfer Bed To Chair/Chair To Bed Outcome: Adequate for Discharge Goal: Pt Will Ambulate Outcome: Adequate for Discharge Goal: Pt Will Go Up/Down Stairs Outcome: Adequate for Discharge

## 2021-02-09 NOTE — Progress Notes (Signed)
  Subjective: 1 Day Post-Op Procedure(s) (LRB): COMPUTER ASSISTED TOTAL KNEE ARTHROPLASTY - RNFA (Left) Patient reports pain as mild.   Patient is well, and has had no acute complaints or problems Plan is to go Home after hospital stay. Negative for chest pain and shortness of breath Fever: no Gastrointestinal:Negative for nausea and vomiting Patient would like to go home today if possible.  Objective: Vital signs in last 24 hours: Temp:  [97 F (36.1 C)-98 F (36.7 C)] 97.6 F (36.4 C) (03/19 0507) Pulse Rate:  [51-62] 51 (03/19 0507) Resp:  [16-21] 18 (03/19 0507) BP: (101-142)/(56-78) 116/75 (03/19 0507) SpO2:  [97 %-100 %] 100 % (03/19 0507)  Intake/Output from previous day:  Intake/Output Summary (Last 24 hours) at 02/09/2021 0825 Last data filed at 02/09/2021 0618 Gross per 24 hour  Intake 2028.23 ml  Output 1235 ml  Net 793.23 ml    Intake/Output this shift: No intake/output data recorded.  Labs: No results for input(s): HGB in the last 72 hours. No results for input(s): WBC, RBC, HCT, PLT in the last 72 hours. No results for input(s): NA, K, CL, CO2, BUN, CREATININE, GLUCOSE, CALCIUM in the last 72 hours. No results for input(s): LABPT, INR in the last 72 hours.   EXAM General - Patient is Alert, Appropriate and Disorganized Extremity - ABD soft Sensation intact distally Intact pulses distally Dorsiflexion/Plantar flexion intact Incision: dressing C/D/I No cellulitis present Dressing/Incision - clean, dry, no drainage Bulky dressing was removed this AM and hemovac removed. No drainage noted to the honeycomb dressing. Motor Function - intact, moving foot and toes well on exam.  Abdomen soft with normal bowel sounds.  Past Medical History:  Diagnosis Date  . Coronary artery disease   . GERD (gastroesophageal reflux disease)   . History of kidney stones   . Hypertension   . Nuclear sclerotic cataract of right eye 01/31/2016  . Osteoarthritis of left  knee 09/16/2020    Assessment/Plan: 1 Day Post-Op Procedure(s) (LRB): COMPUTER ASSISTED TOTAL KNEE ARTHROPLASTY - RNFA (Left) Active Problems:   Total knee replacement status  Estimated body mass index is 29.91 kg/m as calculated from the following:   Height as of 02/01/21: 5\' 7"  (1.702 m).   Weight as of 02/01/21: 86.6 kg. Advance diet Up with therapy D/C IV fluids   Vitals stable this AM. Bulky dressing removed and hemovac removed this AM. Up with therapy today, perform stair exercises. Patient will need a BM prior to discharge. Will need a Walker for home use. Plan for possible discharge home this afternoon.  DVT Prophylaxis - Lovenox, Foot Pumps and TED hose Weight-Bearing as tolerated to left leg  J. Cameron Proud, PA-C [facility] Orthopaedic Surgery 02/09/2021, 8:25 AM

## 2021-02-09 NOTE — Discharge Summary (Signed)
Physician Discharge Summary  Patient ID: Luke Wall MRN: [id number] DOB/AGE: [birth year] 75 y.o.  Admit date: 02/08/2021 Discharge date: 02/09/2021  Admission Diagnoses:  Total knee replacement status [Z96.659]  Degenerative arthrosis of the left knee.  Discharge Diagnoses: Patient Active Problem List   Diagnosis Date Noted  . 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 Primary osteoarthritis of left knee 09/16/2020  . Coronary artery disease involving native coronary artery of native heart with angina pectoris ([place]) 08/09/2019  . Thrombocytopenia ([place]) 05/31/2019  . Class 1 obesity due to excess calories with serious comorbidity and body mass index (BMI) of 31.0 to 31.9 in adult 11/23/2018  . Gastroesophageal reflux disease without esophagitis 11/23/2018  . Impaired fasting glucose 09/17/2017  . Essential hypertension 12/23/2016  . Nuclear sclerotic cataract of right eye 01/31/2016    Past Medical History:  Diagnosis Date  . Coronary artery disease   . GERD (gastroesophageal reflux disease)   . History of kidney stones   . Hypertension   . Nuclear sclerotic cataract of right eye 01/31/2016  . Osteoarthritis of left knee 09/16/2020     Transfusion: None.   Consultants (if any):   Discharged Condition: Improved  Hospital Course: Luke Wall is an 75 y.o. male who was admitted 02/08/2021 with a diagnosis of primary degenerative arthrosis of the left knee and went to the operating room on 02/08/2021 and underwent the above named procedures.    Surgeries: Procedure(s): COMPUTER ASSISTED TOTAL KNEE ARTHROPLASTY - RNFA on 02/08/2021 Patient tolerated the surgery well. Taken to PACU where she was stabilized and then transferred to the orthopedic floor.  Started on Lovenox 30mg  q 12 hrs. Foot pumps applied bilaterally at 80 mm. Heels elevated on bed with rolled towels. No evidence of DVT. Negative [place]. Physical therapy started on day #1 for gait training and transfer. OT  started day #1 for ADL and assisted devices.  Patient's IV, foley and Hemovac were all removed on POD1.  Implants: DePuy Attune size 6 posterior stabilized femoral component (cemented), size 7 rotating platform tibial component (cemented), 38 mm medialized dome patella (cemented), and a 7 mm stabilized rotating platform polyethylene insert.  He was given perioperative antibiotics:  Anti-infectives (From admission, onward)   Start     Dose/Rate Route Frequency Ordered Stop   02/08/21 1400  ceFAZolin (ANCEF) IVPB 2g/100 mL premix        2 g 200 mL/hr over 30 Minutes Intravenous Every 6 hours 02/08/21 1302 02/08/21 2150   02/08/21 0600  ceFAZolin (ANCEF) IVPB 2g/100 mL premix        2 g 200 mL/hr over 30 Minutes Intravenous On call to O.R. 02/08/21 0028 02/08/21 0755    .  He was given sequential compression devices, early ambulation, and Lovenox for DVT prophylaxis.  He benefited maximally from the hospital stay and there were no complications.    Recent vital signs:  Vitals:   02/08/21 2328 02/09/21 0507  BP: (!) 112/56 116/75  Pulse: (!) 55 (!) 51  Resp: 18 18  Temp: 98 F (36.7 C) 97.6 F (36.4 C)  SpO2: 99% 100%    Recent laboratory studies:  Lab Results  Component Value Date   HGB 15.8 02/01/2021   Lab Results  Component Value Date   WBC 6.0 02/01/2021   PLT 109 (L) 02/01/2021   Lab Results  Component Value Date   INR 1.1 02/01/2021   Lab Results  Component Value Date   NA 138 02/01/2021   K  3.7 02/01/2021   CL 105 02/01/2021   CO2 26 02/01/2021   BUN 16 02/01/2021   CREATININE 0.87 02/01/2021   GLUCOSE 84 02/01/2021    Discharge Medications:   Allergies as of 02/09/2021   No Known Allergies     Medication List    STOP taking these medications   diclofenac 75 MG EC tablet Commonly known as: VOLTAREN     TAKE these medications   acetaminophen 500 MG tablet Commonly known as: TYLENOL Take 1,000 mg by mouth every 6 (six) hours as needed for  moderate pain.   albuterol 108 (90 Base) MCG/ACT inhaler Commonly known as: VENTOLIN HFA Inhale 1-2 puffs into the lungs every 6 (six) hours as needed for wheezing or shortness of breath.   atenolol 50 MG tablet Commonly known as: TENORMIN Take 50 mg by mouth daily.   celecoxib 200 MG capsule Commonly known as: CELEBREX Take 1 capsule (200 mg total) by mouth 2 (two) times daily.   diclofenac Sodium 1 % Gel Commonly known as: VOLTAREN Apply 2 g topically daily as needed (knee pain).   enoxaparin 40 MG/0.4ML injection Commonly known as: LOVENOX Inject 0.4 mLs (40 mg total) into the skin daily.   omeprazole 20 MG capsule Commonly known as: PRILOSEC Take 20 mg by mouth daily.   oxyCODONE 5 MG immediate release tablet Commonly known as: Oxy IR/ROXICODONE Take 1-2 tablets (5-10 mg total) by mouth every 4 (four) hours as needed for moderate pain (pain score 4-6).   rosuvastatin 10 MG tablet Commonly known as: CRESTOR Take 10 mg by mouth daily.            Durable Medical Equipment  (From admission, onward)         Start     Ordered   02/09/21 0825  For home use only DME Other see comment  Once       Comments: Gilford Rile  Question:  Length of Need  Answer:  12 Months   02/09/21 0824   02/08/21 1303  DME Walker rolling  Once       Question:  Patient needs a walker to treat with the following condition  Answer:  Total knee replacement status   02/08/21 1302   02/08/21 1303  DME Bedside commode  Once       Question:  Patient needs a bedside commode to treat with the following condition  Answer:  Total knee replacement status   02/08/21 1302          Diagnostic Studies: DG Knee Left Port  Result Date: 02/08/2021 CLINICAL DATA:  75 year old male status post left knee replacement. EXAM: PORTABLE LEFT KNEE - 1-2 VIEW COMPARISON:  None. FINDINGS: AP and cross-table lateral views at 1113 hours. There is a subacute appearing but ununited fracture of the proximal left fibula  shaft, nondisplaced. Margins of the fracture indistinct and underlying fibula bone mineralization is somewhat heterogeneous. Superimposed left total knee arthroplasty hardware appears intact and normally aligned. Postoperative drain in place. Expected osseous changes to the patella. Anterior skin staples and mild regional soft tissue gas. IMPRESSION: 1. Subacute appearing and ununited fracture of the proximal left fibula shaft without displacement. A pathologic fracture is not excluded. 2. Superimposed left total knee arthroplasty with no adverse features. Electronically Signed   By: Genevie Ann M.D.   On: 02/08/2021 11:36   Disposition: Plan for discharge home today pending progress with Pt and having a BM.   Follow-up Information    Fausto Skillern,  PA-C On 02/22/2021.   Specialty: Orthopedic Surgery Why: at 9:45am Contact information: [place] [address] [phone]        Dereck Leep, MD On 03/26/2021.   Specialty: Orthopedic Surgery Why: at 3:00pm Contact information: [place] [address] [phone]              Signed: Judson Roch PA-C 02/09/2021, 8:30 AM

## 2021-02-09 NOTE — Progress Notes (Signed)
Physical Therapy Treatment Patient Details Name: Luke Wall MRN: [id number] DOB: [birth year] Today's Date: 02/09/2021    History of Present Illness Pt is a 75 yo male diagnosed with degenerative arthrosis of the left knee and is s/p elective L TKA.  PMH includes: HTN, CAD, GERD, and MI.    PT Comments    Pt was seated on BSC upon PT arrival, having successfully evacuated his bowels. Pt requiring supervision for sit<>stand transfer from [facility] and chair. Pt completed 299ft of ambulation using RW, CGA with a step through antalgic gait pattern and decreased heel strike on the LLE. Completed 2 steps with step to pattern using BIL railing to simulate home setting, pt was able to safely demonstrate stair negotiation with supervision.  Completed therapeutic exercises including: quad sets x15 with 5 sec hold, heel slides x15, hip ABD/ADD x 15, SLR x10, SAQ with 5 sec hold, seated knee flexion x10 and LAQ x15. Patient able to complete therapeutic exercises with VCs for pacing and form.  Educated patient on the importance of ambulating regularly and reviewed appropriate use of blue foam to improve TKE positioning. Reviewed HEP for compliance with pt and caregiver for understanding. Pt left seated on the [facility] to evacuate his bowels again with wife present and call bell in reach. Pt L knee AROM: -3-88.  Follow Up Recommendations  Home health PT;Supervision for mobility/OOB     Equipment Recommendations  Rolling walker with 5" wheels;3in1 (PT)    Recommendations for Other Services       Precautions / Restrictions Precautions Precautions: Fall Restrictions Weight Bearing Restrictions: Yes LLE Weight Bearing: Weight bearing as tolerated Other Position/Activity Restrictions: Pt completing SLRs with no quad lag    Mobility  Bed Mobility Overal bed mobility: Independent               Patient Response: Cooperative  Transfers Overall transfer level: Needs assistance Equipment used: Rolling  walker (2 wheeled) Transfers: Sit to/from Stand Sit to Stand: Min guard         General transfer comment: Mod verbal and tactile cues for sequencing for hand placement to avoid pulling on the RW  Ambulation/Gait Ambulation/Gait assistance: Min guard Gait Distance (Feet): 220 Feet Assistive device: Rolling walker (2 wheeled) Gait Pattern/deviations: Step-to pattern;Decreased stance time - left;Decreased step length - right;Antalgic Gait velocity: decreased   General Gait Details: Pt had a decreased velocity, but he was able to ambulate with a step through gait pattern with increased weight placed on the walker during L foot flat phase   Stairs Stairs: Yes Stairs assistance: Min guard Stair Management: Two rails Number of Stairs: 2 General stair comments: Pt able to complete stairs with the apropriate sequence (up with the good/down with the bad) and minimal pain   Wheelchair Mobility    Modified Rankin (Stroke Patients Only)       Balance Overall balance assessment: Needs assistance Sitting-balance support: Single extremity supported Sitting balance-Leahy Scale: Normal Sitting balance - Comments: Pt able to complete dynamic reaching in the seated position with supervison without LOB   Standing balance support: Bilateral upper extremity supported;During functional activity Standing balance-Leahy Scale: Good Standing balance comment: Pt requires BIL UE assist on RW to maintain standing balance safely                            Cognition Arousal/Alertness: Awake/alert Behavior During Therapy: WFL for tasks assessed/performed Overall Cognitive Status: Within Functional Limits for tasks  assessed                                        Exercises Total Joint Exercises Quad Sets: Strengthening;Left;10 reps (5 second hold) Short Arc Quad: AROM;Left;15 reps (3 sec hold at the top) Heel Slides: AROM;Left;15 reps Hip ABduction/ADduction:  AROM;Strengthening;Left;15 reps Straight Leg Raises: Strengthening;Left;10 reps Long Arc Quad: Strengthening;Left;15 reps Knee Flexion: AROM;Left;10 reps Goniometric ROM: L knee AROM: -3-88    General Comments        Pertinent Vitals/Pain Pain Score: 4  Pain Location: L knee Pain Descriptors / Indicators: Sore;Tightness Pain Intervention(s): Premedicated before session;Repositioned    Home Living                      Prior Function            PT Goals (current goals can now be found in the care plan section) Acute Rehab PT Goals Patient Stated Goal: To get back to living my life including mowing my yard and working on my house PT Goal Formulation: With patient Time For Goal Achievement: 02/21/21 Potential to Achieve Goals: Good Progress towards PT goals: Progressing toward goals    Frequency    BID      PT Plan      Co-evaluation              AM-PAC PT "6 Clicks" Mobility   Outcome Measure  Help needed turning from your back to your side while in a flat bed without using bedrails?: None Help needed moving from lying on your back to sitting on the side of a flat bed without using bedrails?: A Little Help needed moving to and from a bed to a chair (including a wheelchair)?: A Little Help needed standing up from a chair using your arms (e.g., wheelchair or bedside chair)?: A Little Help needed to walk in hospital room?: A Little Help needed climbing 3-5 steps with a railing? : A Little 6 Click Score: 19    End of Session Equipment Utilized During Treatment: Gait belt Activity Tolerance: Patient tolerated treatment well Patient left: in chair;with call bell/phone within reach;with SCD's reapplied;with family/visitor present;Other (comment) (Pt left seated on [facility] with wife present, call bell in reach) Nurse Communication: Mobility status;Weight bearing status PT Visit Diagnosis: Other abnormalities of gait and mobility (R26.89);Muscle weakness  (generalized) (M62.81);Pain Pain - Right/Left: Left Pain - part of body: Knee     Time: 1856-3149 PT Time Calculation (min) (ACUTE ONLY): 26 min  Charges:  $Gait Training: 8-22 mins $Therapeutic Exercise: 8-22 mins                    Duanne Guess, PT, DPT 02/09/21, 10:49 AM   Isaias Cowman 02/09/2021, 10:43 AM

## 2021-02-09 NOTE — Progress Notes (Signed)
Discharge note: Reviewed discharge instructions with pt.  PT verbalized understanding. Honeycomb dressing to left knee dry, clean, intact. Compression socks on noted. IV cath intact upon removal. Pt d/ced with BSC, Standard walker, Zero Bone Foam, Polar care, and personal belongings. Staff wheeled pt out. Pt transported to home by wife.

## 2021-02-10 DIAGNOSIS — I10 Essential (primary) hypertension: Secondary | ICD-10-CM | POA: Diagnosis not present

## 2021-02-10 DIAGNOSIS — K219 Gastro-esophageal reflux disease without esophagitis: Secondary | ICD-10-CM | POA: Diagnosis not present

## 2021-02-10 DIAGNOSIS — Z6830 Body mass index (BMI) 30.0-30.9, adult: Secondary | ICD-10-CM | POA: Diagnosis not present

## 2021-02-10 DIAGNOSIS — K5900 Constipation, unspecified: Secondary | ICD-10-CM | POA: Diagnosis not present

## 2021-02-10 DIAGNOSIS — Z471 Aftercare following joint replacement surgery: Secondary | ICD-10-CM | POA: Diagnosis not present

## 2021-02-10 DIAGNOSIS — E6609 Other obesity due to excess calories: Secondary | ICD-10-CM | POA: Diagnosis not present

## 2021-02-10 DIAGNOSIS — I2510 Atherosclerotic heart disease of native coronary artery without angina pectoris: Secondary | ICD-10-CM | POA: Diagnosis not present

## 2021-02-10 DIAGNOSIS — N3941 Urge incontinence: Secondary | ICD-10-CM | POA: Diagnosis not present

## 2021-02-10 DIAGNOSIS — R350 Frequency of micturition: Secondary | ICD-10-CM | POA: Diagnosis not present

## 2021-02-10 LAB — TYPE AND SCREEN
ABO/RH(D): AB POS
Antibody Screen: POSITIVE
Unit division: 0
Unit division: 0

## 2021-02-10 LAB — BPAM RBC
Blood Product Expiration Date: 202204082359
Blood Product Expiration Date: 202204102359
Unit Type and Rh: 600
Unit Type and Rh: 600

## 2021-02-11 ENCOUNTER — Encounter: Payer: Self-pay | Admitting: Orthopedic Surgery

## 2021-02-12 DIAGNOSIS — Z471 Aftercare following joint replacement surgery: Secondary | ICD-10-CM | POA: Diagnosis not present

## 2021-02-12 DIAGNOSIS — I10 Essential (primary) hypertension: Secondary | ICD-10-CM | POA: Diagnosis not present

## 2021-02-12 DIAGNOSIS — R350 Frequency of micturition: Secondary | ICD-10-CM | POA: Diagnosis not present

## 2021-02-12 DIAGNOSIS — N3941 Urge incontinence: Secondary | ICD-10-CM | POA: Diagnosis not present

## 2021-02-12 DIAGNOSIS — E6609 Other obesity due to excess calories: Secondary | ICD-10-CM | POA: Diagnosis not present

## 2021-02-12 DIAGNOSIS — K219 Gastro-esophageal reflux disease without esophagitis: Secondary | ICD-10-CM | POA: Diagnosis not present

## 2021-02-12 DIAGNOSIS — I2510 Atherosclerotic heart disease of native coronary artery without angina pectoris: Secondary | ICD-10-CM | POA: Diagnosis not present

## 2021-02-12 DIAGNOSIS — K5900 Constipation, unspecified: Secondary | ICD-10-CM | POA: Diagnosis not present

## 2021-02-12 DIAGNOSIS — Z6830 Body mass index (BMI) 30.0-30.9, adult: Secondary | ICD-10-CM | POA: Diagnosis not present

## 2021-02-14 DIAGNOSIS — Z6830 Body mass index (BMI) 30.0-30.9, adult: Secondary | ICD-10-CM | POA: Diagnosis not present

## 2021-02-14 DIAGNOSIS — I2510 Atherosclerotic heart disease of native coronary artery without angina pectoris: Secondary | ICD-10-CM | POA: Diagnosis not present

## 2021-02-14 DIAGNOSIS — K219 Gastro-esophageal reflux disease without esophagitis: Secondary | ICD-10-CM | POA: Diagnosis not present

## 2021-02-14 DIAGNOSIS — E6609 Other obesity due to excess calories: Secondary | ICD-10-CM | POA: Diagnosis not present

## 2021-02-14 DIAGNOSIS — Z471 Aftercare following joint replacement surgery: Secondary | ICD-10-CM | POA: Diagnosis not present

## 2021-02-14 DIAGNOSIS — R350 Frequency of micturition: Secondary | ICD-10-CM | POA: Diagnosis not present

## 2021-02-14 DIAGNOSIS — K5900 Constipation, unspecified: Secondary | ICD-10-CM | POA: Diagnosis not present

## 2021-02-14 DIAGNOSIS — I10 Essential (primary) hypertension: Secondary | ICD-10-CM | POA: Diagnosis not present

## 2021-02-14 DIAGNOSIS — N3941 Urge incontinence: Secondary | ICD-10-CM | POA: Diagnosis not present

## 2021-02-15 DIAGNOSIS — N3941 Urge incontinence: Secondary | ICD-10-CM | POA: Diagnosis not present

## 2021-02-15 DIAGNOSIS — I2510 Atherosclerotic heart disease of native coronary artery without angina pectoris: Secondary | ICD-10-CM | POA: Diagnosis not present

## 2021-02-15 DIAGNOSIS — R350 Frequency of micturition: Secondary | ICD-10-CM | POA: Diagnosis not present

## 2021-02-15 DIAGNOSIS — K5900 Constipation, unspecified: Secondary | ICD-10-CM | POA: Diagnosis not present

## 2021-02-15 DIAGNOSIS — I10 Essential (primary) hypertension: Secondary | ICD-10-CM | POA: Diagnosis not present

## 2021-02-15 DIAGNOSIS — E6609 Other obesity due to excess calories: Secondary | ICD-10-CM | POA: Diagnosis not present

## 2021-02-15 DIAGNOSIS — Z471 Aftercare following joint replacement surgery: Secondary | ICD-10-CM | POA: Diagnosis not present

## 2021-02-15 DIAGNOSIS — Z6830 Body mass index (BMI) 30.0-30.9, adult: Secondary | ICD-10-CM | POA: Diagnosis not present

## 2021-02-15 DIAGNOSIS — K219 Gastro-esophageal reflux disease without esophagitis: Secondary | ICD-10-CM | POA: Diagnosis not present

## 2021-02-18 DIAGNOSIS — Z471 Aftercare following joint replacement surgery: Secondary | ICD-10-CM | POA: Diagnosis not present

## 2021-02-18 DIAGNOSIS — I10 Essential (primary) hypertension: Secondary | ICD-10-CM | POA: Diagnosis not present

## 2021-02-18 DIAGNOSIS — K5900 Constipation, unspecified: Secondary | ICD-10-CM | POA: Diagnosis not present

## 2021-02-18 DIAGNOSIS — E6609 Other obesity due to excess calories: Secondary | ICD-10-CM | POA: Diagnosis not present

## 2021-02-18 DIAGNOSIS — K219 Gastro-esophageal reflux disease without esophagitis: Secondary | ICD-10-CM | POA: Diagnosis not present

## 2021-02-18 DIAGNOSIS — Z6830 Body mass index (BMI) 30.0-30.9, adult: Secondary | ICD-10-CM | POA: Diagnosis not present

## 2021-02-18 DIAGNOSIS — R350 Frequency of micturition: Secondary | ICD-10-CM | POA: Diagnosis not present

## 2021-02-18 DIAGNOSIS — N3941 Urge incontinence: Secondary | ICD-10-CM | POA: Diagnosis not present

## 2021-02-18 DIAGNOSIS — I2510 Atherosclerotic heart disease of native coronary artery without angina pectoris: Secondary | ICD-10-CM | POA: Diagnosis not present

## 2021-02-20 DIAGNOSIS — I10 Essential (primary) hypertension: Secondary | ICD-10-CM | POA: Diagnosis not present

## 2021-02-20 DIAGNOSIS — K5900 Constipation, unspecified: Secondary | ICD-10-CM | POA: Diagnosis not present

## 2021-02-20 DIAGNOSIS — R350 Frequency of micturition: Secondary | ICD-10-CM | POA: Diagnosis not present

## 2021-02-20 DIAGNOSIS — N3941 Urge incontinence: Secondary | ICD-10-CM | POA: Diagnosis not present

## 2021-02-20 DIAGNOSIS — Z6830 Body mass index (BMI) 30.0-30.9, adult: Secondary | ICD-10-CM | POA: Diagnosis not present

## 2021-02-20 DIAGNOSIS — E6609 Other obesity due to excess calories: Secondary | ICD-10-CM | POA: Diagnosis not present

## 2021-02-20 DIAGNOSIS — K219 Gastro-esophageal reflux disease without esophagitis: Secondary | ICD-10-CM | POA: Diagnosis not present

## 2021-02-20 DIAGNOSIS — I2510 Atherosclerotic heart disease of native coronary artery without angina pectoris: Secondary | ICD-10-CM | POA: Diagnosis not present

## 2021-02-20 DIAGNOSIS — Z471 Aftercare following joint replacement surgery: Secondary | ICD-10-CM | POA: Diagnosis not present

## 2021-02-22 DIAGNOSIS — Z471 Aftercare following joint replacement surgery: Secondary | ICD-10-CM | POA: Diagnosis not present

## 2021-02-22 DIAGNOSIS — M25662 Stiffness of left knee, not elsewhere classified: Secondary | ICD-10-CM | POA: Diagnosis not present

## 2021-02-26 DIAGNOSIS — M25662 Stiffness of left knee, not elsewhere classified: Secondary | ICD-10-CM | POA: Diagnosis not present

## 2021-02-28 DIAGNOSIS — M25662 Stiffness of left knee, not elsewhere classified: Secondary | ICD-10-CM | POA: Diagnosis not present

## 2021-03-05 DIAGNOSIS — M25662 Stiffness of left knee, not elsewhere classified: Secondary | ICD-10-CM | POA: Diagnosis not present

## 2021-03-12 DIAGNOSIS — M25662 Stiffness of left knee, not elsewhere classified: Secondary | ICD-10-CM | POA: Diagnosis not present

## 2021-03-14 DIAGNOSIS — M25662 Stiffness of left knee, not elsewhere classified: Secondary | ICD-10-CM | POA: Diagnosis not present

## 2021-03-19 DIAGNOSIS — M25662 Stiffness of left knee, not elsewhere classified: Secondary | ICD-10-CM | POA: Diagnosis not present

## 2021-03-19 DIAGNOSIS — H26493 Other secondary cataract, bilateral: Secondary | ICD-10-CM | POA: Diagnosis not present

## 2021-03-21 DIAGNOSIS — M25662 Stiffness of left knee, not elsewhere classified: Secondary | ICD-10-CM | POA: Diagnosis not present

## 2021-03-26 DIAGNOSIS — Z96652 Presence of left artificial knee joint: Secondary | ICD-10-CM | POA: Diagnosis not present

## 2021-04-10 DIAGNOSIS — M1711 Unilateral primary osteoarthritis, right knee: Secondary | ICD-10-CM | POA: Diagnosis not present

## 2021-05-06 DIAGNOSIS — Z125 Encounter for screening for malignant neoplasm of prostate: Secondary | ICD-10-CM | POA: Diagnosis not present

## 2021-05-06 DIAGNOSIS — I10 Essential (primary) hypertension: Secondary | ICD-10-CM | POA: Diagnosis not present

## 2021-05-13 DIAGNOSIS — I10 Essential (primary) hypertension: Secondary | ICD-10-CM | POA: Diagnosis not present

## 2021-05-13 DIAGNOSIS — Z125 Encounter for screening for malignant neoplasm of prostate: Secondary | ICD-10-CM | POA: Diagnosis not present

## 2021-05-13 DIAGNOSIS — E785 Hyperlipidemia, unspecified: Secondary | ICD-10-CM | POA: Diagnosis not present

## 2021-05-13 DIAGNOSIS — Z1211 Encounter for screening for malignant neoplasm of colon: Secondary | ICD-10-CM | POA: Diagnosis not present

## 2021-05-13 DIAGNOSIS — E876 Hypokalemia: Secondary | ICD-10-CM | POA: Diagnosis not present

## 2021-05-13 DIAGNOSIS — Z0000 Encounter for general adult medical examination without abnormal findings: Secondary | ICD-10-CM | POA: Diagnosis not present

## 2021-05-13 DIAGNOSIS — G4700 Insomnia, unspecified: Secondary | ICD-10-CM | POA: Diagnosis not present

## 2021-05-13 DIAGNOSIS — D696 Thrombocytopenia, unspecified: Secondary | ICD-10-CM | POA: Diagnosis not present

## 2021-05-13 DIAGNOSIS — I25119 Atherosclerotic heart disease of native coronary artery with unspecified angina pectoris: Secondary | ICD-10-CM | POA: Diagnosis not present

## 2021-05-19 NOTE — Discharge Instructions (Signed)
Instructions after Total Knee Replacement   Luke Wall, Jr., M.D.     Dept. of Orthopaedics & Sports Medicine  [facility]  [address]  [place], [place]  [address]  Phone: [phone]   Fax: [phone]    DIET: Drink plenty of non-alcoholic fluids. Resume your normal diet. Include foods high in fiber.  ACTIVITY:  You may use crutches or a walker with weight-bearing as tolerated, unless instructed otherwise. You may be weaned off of the walker or crutches by your Physical Therapist.  Do NOT place pillows under the knee. Anything placed under the knee could limit your ability to straighten the knee.   Continue doing gentle exercises. Exercising will reduce the pain and swelling, increase motion, and prevent muscle weakness.   Please continue to use the TED compression stockings for 6 weeks. You may remove the stockings at night, but should reapply them in the morning. Do not drive or operate any equipment until instructed.  WOUND CARE:  Continue to use the PolarCare or ice packs periodically to reduce pain and swelling. You may bathe or shower after the staples are removed at the first office visit following surgery.  MEDICATIONS: You may resume your regular medications. Please take the pain medication as prescribed on the medication. Do not take pain medication on an empty stomach. You have been given a prescription for a blood thinner (Lovenox or Coumadin). Please take the medication as instructed. (NOTE: After completing a 2 week course of Lovenox, take one Enteric-coated aspirin once a day. This along with elevation will help reduce the possibility of phlebitis in your operated leg.) Do not drive or drink alcoholic beverages when taking pain medications.  CALL THE OFFICE FOR: Temperature above 101 degrees Excessive bleeding or drainage on the dressing. Excessive swelling, coldness, or paleness of the toes. Persistent nausea and vomiting.  FOLLOW-UP:  You  should have an appointment to return to the office in 10-14 days after surgery. Arrangements have been made for continuation of Physical Therapy (either home therapy or outpatient therapy).   [facility] Department Directory         [email or web address]       [email or web address]          Cardiology  Appointments: [place] - [phone] [place] - [phone]  Endocrinology  Appointments: [place] - [phone] [place] - [phone]  Gastroenterology  Appointments: [place] - [phone] [place] - [phone]        General Surgery   Appointments: [place] - [phone]  Internal Medicine/Family Medicine  Appointments: [place] - [phone] [place] - [phone] [place] - [phone]  Metabolic and Weigh Loss Surgery  Appointments: [place] - [phone]        Neurology  Appointments: [place] - [phone] [place] - [phone]  Neurosurgery  Appointments: [place] - [phone]  Obstetrics & Gynecology  Appointments: [place] - [phone] [place] - [phone]        Pediatrics  Appointments: [place] - [phone] [place] - [phone]  Physiatry  Appointments: [place] -[phone]  Physical Therapy  Appointments: [place] - [phone] [place] - [phone]        Podiatry  Appointments: [place] - [phone] [place] - [phone]  Pulmonology  Appointments: [place] - [phone]  Rheumatology  Appointments: [place] - [phone]        [place] Location: [facility]  [address] , [place]  [address]  [place] Location: [facility] [address] [place], [place]  [address]  [place] Location: [facility] [address] [place], [place]  [address]    

## 2021-06-07 ENCOUNTER — Encounter
Admission: RE | Admit: 2021-06-07 | Discharge: 2021-06-07 | Disposition: A | Discharge status: Home / Self Care | Payer: Medicare HMO | Source: Ambulatory Visit | Attending: Orthopedic Surgery | Admitting: Orthopedic Surgery

## 2021-06-07 ENCOUNTER — Other Ambulatory Visit: Payer: Self-pay

## 2021-06-07 DIAGNOSIS — Z01810 Encounter for preprocedural cardiovascular examination: Secondary | ICD-10-CM | POA: Diagnosis not present

## 2021-06-07 DIAGNOSIS — Z01818 Encounter for other preprocedural examination: Secondary | ICD-10-CM | POA: Diagnosis not present

## 2021-06-07 DIAGNOSIS — M1711 Unilateral primary osteoarthritis, right knee: Secondary | ICD-10-CM | POA: Diagnosis not present

## 2021-06-07 LAB — COMPREHENSIVE METABOLIC PANEL
ALT: 30 U/L (ref 0–44)
AST: 43 U/L — ABNORMAL HIGH (ref 15–41)
Albumin: 3.7 g/dL (ref 3.5–5.0)
Alkaline Phosphatase: 65 U/L (ref 38–126)
Anion gap: 9 (ref 5–15)
BUN: 17 mg/dL (ref 8–23)
CO2: 26 mmol/L (ref 22–32)
Calcium: 9.1 mg/dL (ref 8.9–10.3)
Chloride: 103 mmol/L (ref 98–111)
Creatinine, Ser: 0.96 mg/dL (ref 0.61–1.24)
GFR, Estimated: >60 mL/min (ref >60)
Glucose, Bld: 94 mg/dL (ref 70–99)
Potassium: 3.8 mmol/L (ref 3.5–5.1)
Sodium: 138 mmol/L (ref 135–145)
Total Bilirubin: 1.6 mg/dL — ABNORMAL HIGH (ref 0.3–1.2)
Total Protein: 6.8 g/dL (ref 6.5–8.1)

## 2021-06-07 LAB — URINALYSIS, ROUTINE W REFLEX MICROSCOPIC
Bilirubin Urine: NEGATIVE
Glucose, UA: NEGATIVE mg/dL
Ketones, ur: NEGATIVE mg/dL
Leukocytes,Ua: NEGATIVE
Nitrite: NEGATIVE
Protein, ur: NEGATIVE mg/dL
Specific Gravity, Urine: 1.021 (ref 1.005–1.030)
pH: 5 (ref 5.0–8.0)

## 2021-06-07 LAB — CBC
HCT: 41.3 % (ref 39.0–52.0)
Hemoglobin: 14.1 g/dL (ref 13.0–17.0)
MCH: 31.1 pg (ref 26.0–34.0)
MCHC: 34.1 g/dL (ref 30.0–36.0)
MCV: 91 fL (ref 80.0–100.0)
Platelets: 116 10*3/uL — ABNORMAL LOW (ref 150–400)
RBC: 4.54 MIL/uL (ref 4.22–5.81)
RDW: 13.2 % (ref 11.5–15.5)
WBC: 7 10*3/uL (ref 4.0–10.5)
nRBC: 0 % (ref 0.0–0.2)

## 2021-06-07 LAB — SURGICAL PCR SCREEN
MRSA, PCR: NEGATIVE
Staphylococcus aureus: NEGATIVE

## 2021-06-07 LAB — TYPE AND SCREEN
ABO/RH(D): AB POS
Antibody Screen: POSITIVE

## 2021-06-07 LAB — SEDIMENTATION RATE: Sed Rate: 5 mm/hr (ref 0–20)

## 2021-06-07 LAB — C-REACTIVE PROTEIN: CRP: 0.6 mg/dL (ref <1.0)

## 2021-06-07 NOTE — Patient Instructions (Addendum)
Your procedure is scheduled on: Monday June 17, 2021 Report to the Registration Desk on the 1st floor of the [facility]. To find out your arrival time, please call [phone] between 1PM - 3PM on: Friday June 14, 2021  REMEMBER: Instructions that are not followed completely may result in serious medical risk, up to and including death; or upon the discretion of your surgeon and anesthesiologist your surgery may need to be rescheduled.  Do not eat food after midnight the night before surgery.  No gum chewing, lozengers or hard candies.  You may however, drink CLEAR liquids up to 2 hours before you are scheduled to arrive for your surgery. Do not drink anything within 2 hours of your scheduled arrival time.  Clear liquids include: - water  - apple juice without pulp - gatorade (not RED, PURPLE, OR BLUE) - black coffee or tea (Do NOT add milk or creamers to the coffee or tea) Do NOT drink anything that is not on this list.  Type 1 and Type 2 diabetics should only drink water.  In addition, your doctor has ordered for you to drink the provided  Ensure Pre-Surgery Clear Carbohydrate Drink  Drinking this carbohydrate drink up to two hours before surgery helps to reduce insulin resistance and improve patient outcomes. Please complete drinking 2 hours prior to scheduled arrival time.  TAKE THESE MEDICATIONS THE MORNING OF SURGERY WITH A SIP OF WATER: ATENOLOL ROSUVASTATIN OMEPRAZOLE (take one the night before and one on the morning of surgery - helps to prevent nausea after surgery.)   Follow recommendations from Cardiologist, Pulmonologist or PCP regarding stopping Aspirin, Coumadin, Plavix, Eliquis, Pradaxa, or Pletal. STOP ASPIRIN AND CELEBREX STARTING TODAY PER  BEN SMITH PA  One week prior to surgery: Stop Anti-inflammatories (NSAIDS) such as Advil, Aleve, Ibuprofen, Motrin, Naproxen, Naprosyn and Aspirin based products such as Excedrin, Goodys Powder, BC Powder. Stop ANY OVER  THE COUNTER supplements until after surgery. You may however, continue to take Tylenol if needed for pain up until the day of surgery.  No Alcohol for 24 hours before or after surgery.  No Smoking including e-cigarettes for 24 hours prior to surgery.  No chewable tobacco products for at least 6 hours prior to surgery.  No nicotine patches on the day of surgery.  Do not use any "recreational" drugs for at least a week prior to your surgery.  Please be advised that the combination of cocaine and anesthesia may have negative outcomes, up to and including death. If you test positive for cocaine, your surgery will be cancelled.  On the morning of surgery brush your teeth with toothpaste and water, you may rinse your mouth with mouthwash if you wish. Do not swallow any toothpaste or mouthwash.  Do not wear jewelry, make-up, hairpins, clips or nail polish.  Do not wear lotions, powders, or perfumes.   Do not shave body from the neck down 48 hours prior to surgery just in case you cut yourself which could leave a site for infection.  Also, freshly shaved skin may become irritated if using the CHG soap.  Contact lenses, hearing aids and dentures may not be worn into surgery.  Do not bring valuables to the hospital. [facility] is not responsible for any missing/lost belongings or valuables.   Use CHG Soap as directed on instruction sheet.  Notify your doctor if there is any change in your medical condition (cold, fever, infection).  Wear comfortable clothing (specific to your surgery type) to the  hospital.  After surgery, you can help prevent lung complications by doing breathing exercises.  Take deep breaths and cough every 1-2 hours. Your doctor may order a device called an Incentive Spirometer to help you take deep breaths. When coughing or sneezing, hold a pillow firmly against your incision with both hands. This is called "splinting." Doing this helps protect your incision. It also  decreases belly discomfort.  If you are being admitted to the hospital overnight, [place]  If you are being discharged the day of surgery, you will not be allowed to drive home. You will need a responsible adult (18 years or older) to drive you home and stay with you that night.   If you are taking public transportation, you will need to have a responsible adult (18 years or older) with you. Please confirm with your physician that it is acceptable to use public transportation.   Please call the [place] [facility] at [phone] if you have any questions about these instructions.  Surgery Visitation Policy:  Patients undergoing a surgery or procedure may have one family member or support person with them as long as that person is not COVID-19 positive or experiencing its symptoms.  That person may remain in the waiting area during the procedure.  Inpatient Visitation:    Visiting hours are 7 a.m. to 8 p.m. Inpatients will be allowed two visitors daily. The visitors may change each day during the patient's stay. No visitors under the age of 5. Any visitor under the age of 79 must be accompanied by an adult. The visitor must pass COVID-19 screenings, use hand sanitizer when entering and exiting the patient's room and wear a mask at all times, including in the patient's room. Patients must also wear a mask when staff or their visitor are in the room. Masking is required regardless of vaccination status.

## 2021-06-08 LAB — URINE CULTURE
Culture: NO GROWTH
Special Requests: NORMAL

## 2021-06-13 ENCOUNTER — Other Ambulatory Visit
Admission: RE | Admit: 2021-06-13 | Discharge: 2021-06-13 | Disposition: A | Discharge status: Home / Self Care | Payer: Medicare HMO | Source: Ambulatory Visit | Attending: Orthopedic Surgery | Admitting: Orthopedic Surgery

## 2021-06-13 ENCOUNTER — Other Ambulatory Visit: Payer: Self-pay

## 2021-06-13 DIAGNOSIS — Z01812 Encounter for preprocedural laboratory examination: Secondary | ICD-10-CM | POA: Insufficient documentation

## 2021-06-13 DIAGNOSIS — Z20822 Contact with and (suspected) exposure to covid-19: Secondary | ICD-10-CM | POA: Diagnosis not present

## 2021-06-13 LAB — SARS CORONAVIRUS 2 (TAT 6-24 HRS): SARS Coronavirus 2: NEGATIVE

## 2021-06-16 ENCOUNTER — Encounter: Payer: Self-pay | Admitting: Orthopedic Surgery

## 2021-06-16 MED ORDER — CHLORHEXIDINE GLUCONATE 4 % EX LIQD
60.0000 mL | Freq: Once | CUTANEOUS | Status: AC
  Administered 2021-06-17: 4 via TOPICAL

## 2021-06-16 MED ORDER — DEXAMETHASONE SODIUM PHOSPHATE 10 MG/ML IJ SOLN: 8.0000 mg | Freq: Once | INTRAMUSCULAR | Status: AC

## 2021-06-16 MED ORDER — GABAPENTIN 300 MG PO CAPS: 300.0000 mg | ORAL_CAPSULE | Freq: Once | ORAL | Status: AC

## 2021-06-16 MED ORDER — TRANEXAMIC ACID-NACL 1000-0.7 MG/100ML-% IV SOLN
1000.0000 mg | INTRAVENOUS | Status: AC
  Administered 2021-06-17: 1000 mg via INTRAVENOUS

## 2021-06-16 MED ORDER — CEFAZOLIN SODIUM-DEXTROSE 2-4 GM/100ML-% IV SOLN
2.0000 g | INTRAVENOUS | Status: AC
  Administered 2021-06-17: 2 g via INTRAVENOUS

## 2021-06-16 MED ORDER — CELECOXIB 200 MG PO CAPS: 400.0000 mg | ORAL_CAPSULE | Freq: Once | ORAL | Status: AC

## 2021-06-16 NOTE — H&P (Signed)
ORTHOPAEDIC HISTORY & PHYSICAL Gwenlyn Fudge, [place] - 06/07/2021 8:45 AM EDT Formatting of this note is different from the original. [place] [facility] Chief Complaint:   Chief Complaint  Patient presents with   Knee Pain  H & P RIGHT KNEE   History of Present Illness:   Luke Wall is a 75 y.o. male that presents to clinic today for his preoperative history and evaluation. Patient presents unaccompanied. The patient is scheduled to undergo a right total knee arthroplasty on 06/17/21 by Dr. Marry Guan. His pain began several years ago. The pain is located primarily along the medial aspect of the knee. He describes his pain as worse with weightbearing. He reports associated swelling with some giving way of the knee. He denies associated numbness or tingling.   The patient's symptoms have progressed to the point that they decrease his quality of life. The patient has previously undergone conservative treatment including NSAIDS and injections to the knee without adequate control of his symptoms.  Patient has his fiance at home to help him after surgery.  Patient requests the lovenox rx in advance so that the pharmacist at [facility] can make sure they are in stock. He states he had some difficulty following his left knee replacement getting the Lovenox.  Patient has previously been cleared by Dr. Ubaldo Glassing for his left total knee arthroplasty.   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 02/08/2021   Dr Marry Guan   TONSILLECTOMY 1960   Current Medications:  Current Outpatient Medications  Medication Sig Dispense Refill   acetaminophen (TYLENOL) 500 MG tablet Take 1,000 mg by mouth every 6 (six) hours as needed   aspirin 81 MG EC tablet Take 81 mg by mouth once daily   atenoloL (TENORMIN) 50 MG tablet Take 1 tablet (50 mg total) by mouth once daily 90 tablet 3   celecoxib (CELEBREX) 200 MG capsule TAKE ONE CAPSULE BY MOUTH TWICE A DAY 60 capsule 0   cetirizine (ZYRTEC) 10 MG tablet Take 1 tablet (10 mg total) by mouth once daily 30 tablet 0   omeprazole (PRILOSEC) 20 MG DR capsule Take 1 capsule (20 mg total) by mouth once daily 90 capsule 3   rosuvastatin (CRESTOR) 10 MG tablet Take 1 tablet (10 mg total) by mouth once daily 30 tablet 11   No current facility-administered medications for this visit.   Allergies:  Allergies  Allergen Reactions   Etodolac Other (See Comments)  sweating   Tramadol-Acetaminophen Nausea   Social History:  Social History   Socioeconomic History   Marital status: Widowed   Number of children: 1   Years of education: 10   Highest education level: 10th grade  Occupational History   Occupation: Retired Sales promotion account executive  Tobacco Use   Smoking status: Light Tobacco Smoker  Types: Cigarettes   Smokeless tobacco: Never Used   Tobacco comment: 2-3 cigarettes a month  Vaping Use   Vaping Use: Never used  Substance and Sexual Activity   Alcohol use: Not Currently   Drug use: Never  Sexual activity: Defer  Partners: Female  Social History Narrative  Religious Affiliation: NONE   Family History:  Family History  Problem Relation Age of Onset   Alcohol abuse Mother   Stroke Maternal Grandmother   Review of Systems:   A 10+ ROS was performed, reviewed, and the pertinent orthopaedic findings are documented in the HPI.   Physical Examination:   BP 116/70 (BP Location: Left upper arm, Patient Position: Sitting, BP Cuff Size: Adult)  Ht 168.9 cm (5'  6.5")  Wt 85.9 kg (189 lb 6.4 oz)  BMI 30.11 kg/m   Patient is a well-developed, well-nourished male in no acute distress. Patient has normal mood and affect. Patient is alert and oriented to person, place, and time.   HEENT: Atraumatic, normocephalic. Pupils equal and reactive to light. Extraocular motion intact. Noninjected sclera.  Cardiovascular: Regular rate and rhythm, with no murmurs, rubs, or gallops. Distal pulses palpable.  Respiratory: Lungs clear to auscultation bilaterally.   Right Knee:  Soft tissue swelling: mild Effusion: none Erythema: none Crepitance: mild Tenderness: medial joint line Alignment: relative varus Mediolateral laxity: medial pseudolaxity Anterior drawer test:negative Lachman`s test: negative McMurray`s test: negative Atrophy: No significant atrophy.  Quadriceps tone was good. Range of Motion: 0/1/123 degrees   Sensation intact over the saphenous, lateral sural cutaneous, superficial fibular, and deep fibular nerve distributions.  Tests Performed/Reviewed:  X-rays  3 views of the right knee were obtained. Images reveal severe loss of medial compartment joint space with significant osteophyte formation and erosion of the medial tibial plateau. No fractures or dislocations. No osseous abnormality noted.  I personally ordered and interpreted today's x-rays.  Impression:   ICD-10-CM  1. Primary osteoarthritis of right knee M17.11   Plan:   The patient has end-stage degenerative changes of the right knee. It was explained to the patient that the condition is progressive in nature. Having failed conservative treatment, the patient has elected to proceed with a total joint arthroplasty. The patient will undergo a total joint arthroplasty with Dr. Marry Guan. The risks of surgery, including blood clot and infection, were discussed with the patient. Measures to reduce these risks, including the use of anticoagulation, perioperative antibiotics, and early  ambulation were discussed. The importance of postoperative physical therapy was discussed with the patient. The patient elects to proceed with surgery. The patient is instructed to stop all blood thinners prior to surgery. The patient is instructed to call the hospital the day before surgery to learn of the proper arrival time.   Contact our office with any questions or concerns. Follow up as indicated, or sooner should any new problems arise, if conditions worsen, or if they are otherwise concerned.   Gwenlyn Fudge, [facility] [place], [place] [address] Phone: [phone]  This note was generated in part with voice recognition software and I apologize for any typographical errors that were not detected and corrected.  Electronically signed by Gwenlyn Fudge, PA at 06/07/2021 2:07 PM EDT

## 2021-06-17 ENCOUNTER — Observation Stay
Admission: RE | Admit: 2021-06-17 | Discharge: 2021-06-18 | Disposition: A | Discharge status: Home / Self Care | Payer: Medicare HMO | Attending: Orthopedic Surgery | Admitting: Orthopedic Surgery

## 2021-06-17 ENCOUNTER — Other Ambulatory Visit: Payer: Self-pay

## 2021-06-17 ENCOUNTER — Observation Stay: Payer: Medicare HMO

## 2021-06-17 ENCOUNTER — Ambulatory Visit: Payer: Medicare HMO | Admitting: Certified Registered Nurse Anesthetist

## 2021-06-17 ENCOUNTER — Encounter: Payer: Self-pay | Admitting: Orthopedic Surgery

## 2021-06-17 ENCOUNTER — Encounter
Admission: RE | Disposition: A | Discharge status: Home / Self Care | Payer: Self-pay | Source: Home / Self Care | Attending: Orthopedic Surgery

## 2021-06-17 DIAGNOSIS — M1711 Unilateral primary osteoarthritis, right knee: Secondary | ICD-10-CM | POA: Diagnosis not present

## 2021-06-17 DIAGNOSIS — F17210 Nicotine dependence, cigarettes, uncomplicated: Secondary | ICD-10-CM | POA: Diagnosis not present

## 2021-06-17 DIAGNOSIS — Z7982 Long term (current) use of aspirin: Secondary | ICD-10-CM | POA: Diagnosis not present

## 2021-06-17 DIAGNOSIS — I2510 Atherosclerotic heart disease of native coronary artery without angina pectoris: Secondary | ICD-10-CM | POA: Diagnosis not present

## 2021-06-17 DIAGNOSIS — Z79899 Other long term (current) drug therapy: Secondary | ICD-10-CM | POA: Insufficient documentation

## 2021-06-17 DIAGNOSIS — Z96659 Presence of unspecified artificial knee joint: Secondary | ICD-10-CM

## 2021-06-17 DIAGNOSIS — Z96651 Presence of right artificial knee joint: Secondary | ICD-10-CM | POA: Diagnosis not present

## 2021-06-17 DIAGNOSIS — Z471 Aftercare following joint replacement surgery: Secondary | ICD-10-CM | POA: Diagnosis not present

## 2021-06-17 DIAGNOSIS — Z23 Encounter for immunization: Secondary | ICD-10-CM | POA: Diagnosis not present

## 2021-06-17 DIAGNOSIS — K219 Gastro-esophageal reflux disease without esophagitis: Secondary | ICD-10-CM | POA: Diagnosis not present

## 2021-06-17 DIAGNOSIS — I10 Essential (primary) hypertension: Secondary | ICD-10-CM | POA: Insufficient documentation

## 2021-06-17 DIAGNOSIS — Z96652 Presence of left artificial knee joint: Secondary | ICD-10-CM | POA: Insufficient documentation

## 2021-06-17 HISTORY — PX: KNEE ARTHROPLASTY: SHX992

## 2021-06-17 SURGERY — ARTHROPLASTY, KNEE, TOTAL, USING IMAGELESS COMPUTER-ASSISTED NAVIGATION
Anesthesia: Spinal | Site: Knee | Laterality: Right

## 2021-06-17 MED ORDER — MENTHOL 3 MG MT LOZG
1.0000 | LOZENGE | OROMUCOSAL | Status: DC | PRN
  Filled 2021-06-17: qty 9

## 2021-06-17 MED ORDER — DEXAMETHASONE SODIUM PHOSPHATE 10 MG/ML IJ SOLN
INTRAMUSCULAR | Status: AC
  Administered 2021-06-17: 8 mg via INTRAVENOUS
  Filled 2021-06-17: qty 1

## 2021-06-17 MED ORDER — PHENYLEPHRINE HCL (PRESSORS) 10 MG/ML IV SOLN
INTRAVENOUS | Status: AC
  Filled 2021-06-17: qty 1

## 2021-06-17 MED ORDER — SODIUM CHLORIDE 0.9 % IR SOLN
Status: DC | PRN
  Administered 2021-06-17: 3000 mL

## 2021-06-17 MED ORDER — PHENOL 1.4 % MT LIQD
1.0000 | OROMUCOSAL | Status: DC | PRN
  Filled 2021-06-17: qty 177

## 2021-06-17 MED ORDER — SENNOSIDES-DOCUSATE SODIUM 8.6-50 MG PO TABS
1.0000 | ORAL_TABLET | Freq: Two times a day (BID) | ORAL | Status: DC
  Administered 2021-06-17 – 2021-06-18 (×2): 1 via ORAL
  Filled 2021-06-17 (×2): qty 1

## 2021-06-17 MED ORDER — SODIUM CHLORIDE 0.9 % IV SOLN
INTRAVENOUS | Status: DC | PRN
  Administered 2021-06-17: 60 mL

## 2021-06-17 MED ORDER — PROPOFOL 10 MG/ML IV BOLUS
INTRAVENOUS | Status: DC | PRN
  Administered 2021-06-17: 20 mg via INTRAVENOUS
  Administered 2021-06-17: 30 mg via INTRAVENOUS

## 2021-06-17 MED ORDER — TRANEXAMIC ACID-NACL 1000-0.7 MG/100ML-% IV SOLN: 1000.0000 mg | Freq: Once | INTRAVENOUS | Status: AC

## 2021-06-17 MED ORDER — BUPIVACAINE HCL (PF) 0.5 % IJ SOLN
INTRAMUSCULAR | Status: AC
  Filled 2021-06-17: qty 10

## 2021-06-17 MED ORDER — ACETAMINOPHEN 10 MG/ML IV SOLN
INTRAVENOUS | Status: DC | PRN
  Administered 2021-06-17: 1000 mg via INTRAVENOUS

## 2021-06-17 MED ORDER — SODIUM CHLORIDE 0.9 % IV SOLN: INTRAVENOUS | Status: DC

## 2021-06-17 MED ORDER — METOCLOPRAMIDE HCL 10 MG PO TABS
10.0000 mg | ORAL_TABLET | Freq: Three times a day (TID) | ORAL | Status: DC
  Administered 2021-06-17: 10 mg via ORAL
  Filled 2021-06-17 (×2): qty 1

## 2021-06-17 MED ORDER — ALUM & MAG HYDROXIDE-SIMETH 200-200-20 MG/5ML PO SUSP
30.0000 mL | ORAL | Status: DC | PRN
  Administered 2021-06-18: 30 mL via ORAL

## 2021-06-17 MED ORDER — ONDANSETRON HCL 4 MG PO TABS: 4.0000 mg | ORAL_TABLET | Freq: Four times a day (QID) | ORAL | Status: DC | PRN

## 2021-06-17 MED ORDER — OXYCODONE HCL 5 MG PO TABS: 5.0000 mg | ORAL_TABLET | ORAL | Status: DC | PRN

## 2021-06-17 MED ORDER — CELECOXIB 200 MG PO CAPS
200.0000 mg | ORAL_CAPSULE | Freq: Two times a day (BID) | ORAL | Status: DC
  Administered 2021-06-17 – 2021-06-18 (×2): 200 mg via ORAL
  Filled 2021-06-17 (×2): qty 1

## 2021-06-17 MED ORDER — FLEET ENEMA 7-19 GM/118ML RE ENEM: 1.0000 | ENEMA | Freq: Once | RECTAL | Status: DC | PRN

## 2021-06-17 MED ORDER — PROPOFOL 1000 MG/100ML IV EMUL
INTRAVENOUS | Status: AC
  Filled 2021-06-17: qty 100

## 2021-06-17 MED ORDER — ATENOLOL 25 MG PO TABS
50.0000 mg | ORAL_TABLET | Freq: Every day | ORAL | Status: DC
  Administered 2021-06-18: 50 mg via ORAL
  Filled 2021-06-17: qty 2

## 2021-06-17 MED ORDER — ONDANSETRON HCL 4 MG/2ML IJ SOLN: 4.0000 mg | Freq: Four times a day (QID) | INTRAMUSCULAR | Status: DC | PRN

## 2021-06-17 MED ORDER — BUPIVACAINE HCL (PF) 0.25 % IJ SOLN
INTRAMUSCULAR | Status: DC | PRN
  Administered 2021-06-17: 60 mL

## 2021-06-17 MED ORDER — NEOMYCIN-POLYMYXIN B GU 40-200000 IR SOLN
Status: DC | PRN
  Administered 2021-06-17: 14 mL

## 2021-06-17 MED ORDER — ENOXAPARIN SODIUM 30 MG/0.3ML IJ SOSY
30.0000 mg | PREFILLED_SYRINGE | Freq: Two times a day (BID) | INTRAMUSCULAR | Status: DC
  Administered 2021-06-18: 30 mg via SUBCUTANEOUS
  Filled 2021-06-17: qty 0.3

## 2021-06-17 MED ORDER — BISACODYL 10 MG RE SUPP: 10.0000 mg | Freq: Every day | RECTAL | Status: DC | PRN

## 2021-06-17 MED ORDER — ACETAMINOPHEN 10 MG/ML IV SOLN
INTRAVENOUS | Status: AC
  Filled 2021-06-17: qty 100

## 2021-06-17 MED ORDER — LACTATED RINGERS IV SOLN: INTRAVENOUS | Status: DC | PRN

## 2021-06-17 MED ORDER — SODIUM CHLORIDE 0.9 % IV SOLN
INTRAVENOUS | Status: DC | PRN
  Administered 2021-06-17: 30 ug/min via INTRAVENOUS

## 2021-06-17 MED ORDER — FENTANYL CITRATE (PF) 100 MCG/2ML IJ SOLN
INTRAMUSCULAR | Status: AC
  Filled 2021-06-17: qty 2

## 2021-06-17 MED ORDER — CEFAZOLIN SODIUM-DEXTROSE 2-4 GM/100ML-% IV SOLN
2.0000 g | Freq: Four times a day (QID) | INTRAVENOUS | Status: AC
  Administered 2021-06-17 (×2): 2 g via INTRAVENOUS
  Filled 2021-06-17 (×2): qty 100

## 2021-06-17 MED ORDER — GLYCOPYRROLATE 0.2 MG/ML IJ SOLN
INTRAMUSCULAR | Status: DC | PRN
Start: 2021-06-17 — End: 2021-06-17
  Administered 2021-06-17: .2 mg via INTRAVENOUS

## 2021-06-17 MED ORDER — ENSURE PRE-SURGERY PO LIQD
296.0000 mL | Freq: Once | ORAL | Status: DC
  Filled 2021-06-17: qty 296

## 2021-06-17 MED ORDER — PROPOFOL 10 MG/ML IV BOLUS
INTRAVENOUS | Status: AC
  Filled 2021-06-17: qty 20

## 2021-06-17 MED ORDER — FERROUS SULFATE 325 (65 FE) MG PO TABS
325.0000 mg | ORAL_TABLET | Freq: Two times a day (BID) | ORAL | Status: DC
  Administered 2021-06-18: 325 mg via ORAL
  Filled 2021-06-17: qty 1

## 2021-06-17 MED ORDER — CELECOXIB 200 MG PO CAPS
ORAL_CAPSULE | ORAL | Status: AC
  Administered 2021-06-17: 400 mg via ORAL
  Filled 2021-06-17: qty 2

## 2021-06-17 MED ORDER — TRANEXAMIC ACID-NACL 1000-0.7 MG/100ML-% IV SOLN
INTRAVENOUS | Status: AC
  Filled 2021-06-17: qty 100

## 2021-06-17 MED ORDER — OXYCODONE HCL 5 MG PO TABS
10.0000 mg | ORAL_TABLET | ORAL | Status: DC | PRN
  Administered 2021-06-17 – 2021-06-18 (×4): 10 mg via ORAL
  Filled 2021-06-17 (×4): qty 2

## 2021-06-17 MED ORDER — FENTANYL CITRATE (PF) 100 MCG/2ML IJ SOLN: 25.0000 ug | INTRAMUSCULAR | Status: DC | PRN

## 2021-06-17 MED ORDER — PANTOPRAZOLE SODIUM 40 MG PO TBEC
40.0000 mg | DELAYED_RELEASE_TABLET | Freq: Two times a day (BID) | ORAL | Status: DC
  Administered 2021-06-17 – 2021-06-18 (×2): 40 mg via ORAL
  Filled 2021-06-17 (×2): qty 1

## 2021-06-17 MED ORDER — BUPIVACAINE HCL (PF) 0.5 % IJ SOLN
INTRAMUSCULAR | Status: DC | PRN
  Administered 2021-06-17: 3 mL via INTRATHECAL

## 2021-06-17 MED ORDER — GABAPENTIN 300 MG PO CAPS
ORAL_CAPSULE | ORAL | Status: AC
  Administered 2021-06-17: 300 mg via ORAL
  Filled 2021-06-17: qty 1

## 2021-06-17 MED ORDER — FENTANYL CITRATE (PF) 100 MCG/2ML IJ SOLN
INTRAMUSCULAR | Status: DC | PRN
  Administered 2021-06-17 (×2): 50 ug via INTRAVENOUS

## 2021-06-17 MED ORDER — ACETAMINOPHEN 325 MG PO TABS: 325.0000 mg | ORAL_TABLET | Freq: Four times a day (QID) | ORAL | Status: DC | PRN

## 2021-06-17 MED ORDER — ONDANSETRON HCL 4 MG/2ML IJ SOLN
INTRAMUSCULAR | Status: DC | PRN
  Administered 2021-06-17: 4 mg via INTRAVENOUS

## 2021-06-17 MED ORDER — ROSUVASTATIN CALCIUM 10 MG PO TABS
10.0000 mg | ORAL_TABLET | Freq: Every day | ORAL | Status: DC
  Administered 2021-06-18: 10 mg via ORAL
  Filled 2021-06-17: qty 1

## 2021-06-17 MED ORDER — PNEUMOCOCCAL VAC POLYVALENT 25 MCG/0.5ML IJ INJ
0.5000 mL | INJECTION | INTRAMUSCULAR | Status: AC
  Administered 2021-06-18: 0.5 mL via INTRAMUSCULAR
  Filled 2021-06-17 (×2): qty 0.5

## 2021-06-17 MED ORDER — CHLORHEXIDINE GLUCONATE 0.12 % MT SOLN
OROMUCOSAL | Status: AC
  Administered 2021-06-17: 15 mL
  Filled 2021-06-17: qty 15

## 2021-06-17 MED ORDER — ACETAMINOPHEN 10 MG/ML IV SOLN
1000.0000 mg | Freq: Four times a day (QID) | INTRAVENOUS | Status: AC
Start: 2021-06-17 — End: 2021-06-18
  Administered 2021-06-17 – 2021-06-18 (×4): 1000 mg via INTRAVENOUS
  Filled 2021-06-17 (×4): qty 100

## 2021-06-17 MED ORDER — HYDROMORPHONE HCL 1 MG/ML IJ SOLN
0.5000 mg | INTRAMUSCULAR | Status: DC | PRN
Start: 2021-06-17 — End: 2021-06-18

## 2021-06-17 MED ORDER — TRANEXAMIC ACID-NACL 1000-0.7 MG/100ML-% IV SOLN
INTRAVENOUS | Status: AC
  Administered 2021-06-17: 1000 mg via INTRAVENOUS
  Filled 2021-06-17: qty 100

## 2021-06-17 MED ORDER — CEFAZOLIN SODIUM-DEXTROSE 2-4 GM/100ML-% IV SOLN
INTRAVENOUS | Status: AC
  Filled 2021-06-17: qty 100

## 2021-06-17 MED ORDER — SURGIPHOR WOUND IRRIGATION SYSTEM - OPTIME
TOPICAL | Status: DC | PRN
  Administered 2021-06-17: 1 via TOPICAL

## 2021-06-17 MED ORDER — PROPOFOL 500 MG/50ML IV EMUL
INTRAVENOUS | Status: DC | PRN
  Administered 2021-06-17: 100 ug/kg/min via INTRAVENOUS

## 2021-06-17 MED ORDER — MAGNESIUM HYDROXIDE 400 MG/5ML PO SUSP
30.0000 mL | Freq: Every day | ORAL | Status: DC
  Filled 2021-06-17: qty 30

## 2021-06-17 MED ORDER — 0.9 % SODIUM CHLORIDE (POUR BTL) OPTIME
TOPICAL | Status: DC | PRN
  Administered 2021-06-17: 500 mL

## 2021-06-17 MED ORDER — DIPHENHYDRAMINE HCL 12.5 MG/5ML PO ELIX: 12.5000 mg | ORAL_SOLUTION | ORAL | Status: DC | PRN

## 2021-06-17 SURGICAL SUPPLY — 74 items
ATTUNE MED DOME PAT 38 KNEE (Knees) ×1 IMPLANT
ATTUNE PS FEM RT SZ 7 CEM KNEE (Femur) ×1 IMPLANT
ATTUNE PSRP INSR SZ7 10 KNEE (Insert) ×1 IMPLANT
BASE TIBIAL ROT PLAT SZ 7 KNEE (Knees) IMPLANT
BATTERY INSTRU NAVIGATION (MISCELLANEOUS) ×8 IMPLANT
BLADE SAW 70X12.5 (BLADE) ×2 IMPLANT
BLADE SAW 90X13X1.19 OSCILLAT (BLADE) ×2 IMPLANT
BLADE SAW 90X25X1.19 OSCILLAT (BLADE) ×2 IMPLANT
CEMENT BONE GENTAMICIN (Cement) IMPLANT
CEMENT HV SMART SET (Cement) ×2 IMPLANT
COOLER POLAR GLACIER W/PUMP (MISCELLANEOUS) ×2 IMPLANT
CUFF TOURN SGL QUICK 24 (TOURNIQUET CUFF) ×1
CUFF TRNQT CYL 24X4X16.5-23 (TOURNIQUET CUFF) IMPLANT
DRAPE 3/4 80X56 (DRAPES) ×2 IMPLANT
DRSG DERMACEA 8X12 NADH (GAUZE/BANDAGES/DRESSINGS) ×2 IMPLANT
DRSG MEPILEX SACRM 8.7X9.8 (GAUZE/BANDAGES/DRESSINGS) ×2 IMPLANT
DRSG OPSITE POSTOP 4X14 (GAUZE/BANDAGES/DRESSINGS) ×2 IMPLANT
DRSG TEGADERM 4X4.75 (GAUZE/BANDAGES/DRESSINGS) ×2 IMPLANT
DURAPREP 26ML APPLICATOR (WOUND CARE) ×4 IMPLANT
ELECT CAUTERY BLADE 6.4 (BLADE) ×2 IMPLANT
ELECT REM PT RETURN 9FT ADLT (ELECTROSURGICAL) ×2
ELECTRODE REM PT RTRN 9FT ADLT (ELECTROSURGICAL) ×1 IMPLANT
EX-PIN ORTHOLOCK NAV 4X150 (PIN) ×4 IMPLANT
GAUZE 4X4 16PLY ~~LOC~~+RFID DBL (SPONGE) ×2 IMPLANT
GLOVE SURG ENC MOIS LTX SZ7.5 (GLOVE) ×4 IMPLANT
GLOVE SURG ENC TEXT LTX SZ7.5 (GLOVE) ×4 IMPLANT
GLOVE SURG UNDER LTX SZ8 (GLOVE) ×2 IMPLANT
GLOVE SURG UNDER POLY LF SZ7.5 (GLOVE) ×2 IMPLANT
GOWN STRL REUS W/ TWL LRG LVL3 (GOWN DISPOSABLE) ×2 IMPLANT
GOWN STRL REUS W/ TWL XL LVL3 (GOWN DISPOSABLE) ×1 IMPLANT
GOWN STRL REUS W/TWL LRG LVL3 (GOWN DISPOSABLE) ×2
GOWN STRL REUS W/TWL XL LVL3 (GOWN DISPOSABLE) ×1
HEMOVAC 400CC 10FR (MISCELLANEOUS) ×2 IMPLANT
HOLDER FOLEY CATH W/STRAP (MISCELLANEOUS) ×2 IMPLANT
HOOD PEEL AWAY FLYTE STAYCOOL (MISCELLANEOUS) ×4 IMPLANT
IRRIGATION SURGIPHOR STRL (IV SOLUTION) ×2 IMPLANT
IV NS IRRIG 3000ML ARTHROMATIC (IV SOLUTION) ×2 IMPLANT
KIT TURNOVER KIT A (KITS) ×2 IMPLANT
KNIFE SCULPS 14X20 (INSTRUMENTS) ×2 IMPLANT
LABEL OR SOLS (LABEL) ×2 IMPLANT
MANIFOLD NEPTUNE II (INSTRUMENTS) ×4 IMPLANT
NDL SAFETY ECLIPSE 18X1.5 (NEEDLE) ×1 IMPLANT
NDL SPNL 20GX3.5 QUINCKE YW (NEEDLE) ×2 IMPLANT
NEEDLE HYPO 18GX1.5 SHARP (NEEDLE) ×1
NEEDLE SPNL 20GX3.5 QUINCKE YW (NEEDLE) ×4 IMPLANT
NS IRRIG 500ML POUR BTL (IV SOLUTION) ×2 IMPLANT
PACK TOTAL KNEE (MISCELLANEOUS) ×2 IMPLANT
PAD WRAPON POLAR KNEE (MISCELLANEOUS) ×1 IMPLANT
PENCIL SMOKE EVACUATOR COATED (MISCELLANEOUS) ×2 IMPLANT
PIN DRILL FIX HALF THREAD (BIT) ×4 IMPLANT
PIN DRILL QUICK PACK ×4 IMPLANT
PIN FIXATION 1/8DIA X 3INL (PIN) ×2 IMPLANT
PULSAVAC PLUS IRRIG FAN TIP (DISPOSABLE) ×2
SOL PREP PVP 2OZ (MISCELLANEOUS) ×2
SOLUTION PREP PVP 2OZ (MISCELLANEOUS) ×1 IMPLANT
SPONGE DRAIN TRACH 4X4 STRL 2S (GAUZE/BANDAGES/DRESSINGS) ×2 IMPLANT
SPONGE T-LAP 18X18 ~~LOC~~+RFID (SPONGE) ×6 IMPLANT
STAPLER SKIN PROX 35W (STAPLE) ×2 IMPLANT
STOCKINETTE IMPERV 14X48 (MISCELLANEOUS) ×1 IMPLANT
STRAP TIBIA SHORT (MISCELLANEOUS) ×2 IMPLANT
SUCTION FRAZIER HANDLE 10FR (MISCELLANEOUS) ×1
SUCTION TUBE FRAZIER 10FR DISP (MISCELLANEOUS) ×1 IMPLANT
SUT VIC AB 0 CT1 36 (SUTURE) ×4 IMPLANT
SUT VIC AB 1 CT1 36 (SUTURE) ×4 IMPLANT
SUT VIC AB 2-0 CT2 27 (SUTURE) ×2 IMPLANT
SYR 20ML LL LF (SYRINGE) ×2 IMPLANT
SYR 30ML LL (SYRINGE) ×4 IMPLANT
TIBIAL BASE ROT PLAT SZ 7 KNEE (Knees) ×2 IMPLANT
TIP FAN IRRIG PULSAVAC PLUS (DISPOSABLE) ×1 IMPLANT
TOWEL OR 17X26 4PK STRL BLUE (TOWEL DISPOSABLE) ×2 IMPLANT
TOWER CARTRIDGE SMART MIX (DISPOSABLE) ×2 IMPLANT
TRAY FOLEY MTR SLVR 16FR STAT (SET/KITS/TRAYS/PACK) ×2 IMPLANT
WATER STERILE IRR 1000ML POUR (IV SOLUTION) ×1 IMPLANT
WRAPON POLAR PAD KNEE (MISCELLANEOUS) ×2

## 2021-06-17 NOTE — Evaluation (Signed)
Physical Therapy Evaluation Patient Details Name: Luke Wall MRN: [id number] DOB: Apr 05, 1946 Today's Date: 06/17/2021   History of Present Illness  Pt is a 75 y.o. M s/p R TKA on 06/17/21. PMH includes L TKA on 01/2021, GERD, HTN, CAD, & past MI  Clinical Impression  Pt alert, motivated for PT. PLOF is independent w/ ADLs, IADLs, and ambulates w/ SPC due to R knee pain. Pt's partner is available for assistance 24/7 at discharge if necessary.  Bed mobility requires min-guard with bed being flat. Pt noted lightheadedness with sitting. BP 159/ 78. No further issues with symptoms. Pt required min-A for transfers w/ RW. Ambulated in room with min-guard for safety w/ RW. Pt agreeable to sit upright in recliner. PT to assess ambulation distance and stairs at next PT session. Skilled PT intervention is indicated to address deficits in function, mobility, and to return to PLOF as able.  Discharge recommendations are HHPT due to family support.     Follow Up Recommendations Home health PT    Equipment Recommendations  Rolling walker with 5" wheels    Recommendations for Other Services       Precautions / Restrictions Precautions Precautions: Knee;Fall Precaution Booklet Issued: Yes (comment) Restrictions Weight Bearing Restrictions: Yes RLE Weight Bearing: Weight bearing as tolerated      Mobility  Bed Mobility Overal bed mobility: Needs Assistance Bed Mobility: Supine to Sit     Supine to sit: Min guard     General bed mobility comments: min-gaurd for safety    Transfers Overall transfer level: Needs assistance Equipment used: Rolling walker (2 wheeled) Transfers: Sit to/from Stand Sit to Stand: Min assist            Ambulation/Gait Ambulation/Gait assistance: Min guard Gait Distance (Feet): 15 Feet Assistive device: Rolling walker (2 wheeled) Gait Pattern/deviations: Decreased weight shift to right Gait velocity: Decreased      Stairs             Wheelchair Mobility    Modified Rankin (Stroke Patients Only)       Balance Overall balance assessment: Needs assistance Sitting-balance support: No upper extremity supported;Feet supported Sitting balance-Leahy Scale: Good       Standing balance-Leahy Scale: Fair Standing balance comment: requires BUE support                             Pertinent Vitals/Pain Pain Assessment: 0-10 Pain Score: 5  Pain Descriptors / Indicators: Aching;Sore Pain Intervention(s): Limited activity within patient's tolerance;Monitored during session;Premedicated before session;Repositioned;RN gave pain meds during session;Ice applied    Home Living Family/patient expects to be discharged to:: Private residence Living Arrangements: Spouse/significant other Available Help at Discharge: Family;Available 24 hours/day Type of Home: House Home Access: Stairs to enter Entrance Stairs-Rails: Right;Left;Can reach both Entrance Stairs-Number of Steps: 2 Home Layout: One level Home Equipment: Cane - single point;Shower seat;Bedside commode      Prior Function Level of Independence: Independent with assistive device(s)         Comments: Independent ADLs, and IADLs, SPC for ambulation     Hand Dominance   Dominant Hand: Right    Extremity/Trunk Assessment   Upper Extremity Assessment Upper Extremity Assessment: Overall WFL for tasks assessed    Lower Extremity Assessment Lower Extremity Assessment: RLE deficits/detail;LLE deficits/detail RLE Sensation: WNL (SILT LE and dorsal, plantar surface of foot) LLE Deficits / Details: WNL for task assessed LLE Sensation: WNL    Cervical /  Trunk Assessment Cervical / Trunk Assessment: Normal  Communication   Communication: No difficulties  Cognition Arousal/Alertness: Awake/alert Behavior During Therapy: WFL for tasks assessed/performed Overall Cognitive Status: Within Functional Limits for tasks assessed                                  General Comments: AOx4      General Comments      Exercises Total Joint Exercises Ankle Circles/Pumps: AROM;Both;10 reps;Seated Quad Sets: AROM;Right;Seated;10 reps Gluteal Sets: AROM;Both;5 reps Straight Leg Raises: AROM;Right;5 reps;Supine Goniometric ROM: Knee extension: +5 degrees, knee flexion: 68 degrees; minimal quad lag during SLR Other Exercises Other Exercises: Pt education: safety, HEP   Assessment/Plan    PT Assessment Patient needs continued PT services  PT Problem List Decreased strength;Decreased range of motion;Decreased activity tolerance;Decreased balance;Decreased mobility;Decreased coordination       PT Treatment Interventions Gait training;Stair training;Functional mobility training;Therapeutic activities;Therapeutic exercise;Balance training;Neuromuscular re-education    PT Goals (Current goals can be found in the Care Plan section)  Acute Rehab PT Goals Patient Stated Goal: To go home PT Goal Formulation: With patient Time For Goal Achievement: 07/01/21 Potential to Achieve Goals: Good    Frequency BID   Barriers to discharge        Co-evaluation               AM-PAC PT "6 Clicks" Mobility  Outcome Measure Help needed turning from your back to your side while in a flat bed without using bedrails?: A Little Help needed moving from lying on your back to sitting on the side of a flat bed without using bedrails?: A Little Help needed moving to and from a bed to a chair (including a wheelchair)?: A Little Help needed standing up from a chair using your arms (e.g., wheelchair or bedside chair)?: A Little Help needed to walk in hospital room?: A Little Help needed climbing 3-5 steps with a railing? : A Lot 6 Click Score: 17    End of Session Equipment Utilized During Treatment: Gait belt Activity Tolerance: Patient tolerated treatment well;No increased pain Patient left: in chair;with SCD's reapplied;with call  bell/phone within reach;with chair alarm set;with family/visitor present   PT Visit Diagnosis: Other abnormalities of gait and mobility (R26.89);Muscle weakness (generalized) (M62.81)    Time: 1515-1600 PT Time Calculation (min) (ACUTE ONLY): 45 min   Charges:             [employer], SPT

## 2021-06-17 NOTE — Op Note (Signed)
OPERATIVE NOTE  DATE OF SURGERY:  06/17/2021  PATIENT NAME:  Luke Wall   DOB: December 29, 1945  MRN: [id number]  PRE-OPERATIVE DIAGNOSIS: Degenerative arthrosis of the right knee, primary  POST-OPERATIVE DIAGNOSIS:  Same  PROCEDURE:  Right total knee arthroplasty using computer-assisted navigation  SURGEON:  Marciano Sequin. M.D.  ASSISTANT: Cassell Smiles, PA-C (present and scrubbed throughout the case, critical for assistance with exposure, retraction, instrumentation, and closure)  ANESTHESIA: spinal  ESTIMATED BLOOD LOSS: 50 mL  FLUIDS REPLACED: 1000 mL of crystalloid  TOURNIQUET TIME: 9. minutes  DRAINS: 2 medium Hemovac drains  SOFT TISSUE RELEASES: Anterior cruciate ligament, posterior cruciate ligament, deep medial collateral ligament, patellofemoral ligament  IMPLANTS UTILIZED: DePuy Attune size 7 posterior stabilized femoral component (cemented), size 7 rotating platform tibial component (cemented), 38 mm medialized dome patella (cemented), and a 10 mm stabilized rotating platform polyethylene insert.  INDICATIONS FOR SURGERY: Luke Wall is a 75 y.o. year old male with a long history of progressive knee pain. X-rays demonstrated severe degenerative changes in tricompartmental fashion. The patient had not seen any significant improvement despite conservative nonsurgical intervention. After discussion of the risks and benefits of surgical intervention, the patient expressed understanding of the risks benefits and agree with plans for total knee arthroplasty.   The risks, benefits, and alternatives were discussed at length including but not limited to the risks of infection, bleeding, nerve injury, stiffness, blood clots, the need for revision surgery, cardiopulmonary complications, among others, and they were willing to proceed.  PROCEDURE IN DETAIL: The patient was brought into the operating room and, after adequate spinal anesthesia was achieved, a tourniquet was placed on  the patient's upper thigh. The patient's knee and leg were cleaned and prepped with alcohol and DuraPrep and draped in the usual sterile fashion. A "timeout" was performed as per usual protocol. The lower extremity was exsanguinated using an Esmarch, and the tourniquet was inflated to 300 mmHg. An anterior longitudinal incision was made followed by a standard mid vastus approach. The deep fibers of the medial collateral ligament were elevated in a subperiosteal fashion off of the medial flare of the tibia so as to maintain a continuous soft tissue sleeve. The patella was subluxed laterally and the patellofemoral ligament was incised. Inspection of the knee demonstrated severe degenerative changes with full-thickness loss of articular cartilage. Osteophytes were debrided using a rongeur. Anterior and posterior cruciate ligaments were excised. Two 4.0 mm Schanz pins were inserted in the femur and into the tibia for attachment of the array of trackers used for computer-assisted navigation. Hip center was identified using a circumduction technique. Distal landmarks were mapped using the computer. The distal femur and proximal tibia were mapped using the computer. The distal femoral cutting guide was positioned using computer-assisted navigation so as to achieve a 5 distal valgus cut. The femur was sized and it was felt that a size 7 femoral component was appropriate. A size 7 femoral cutting guide was positioned and the anterior cut was performed and verified using the computer. This was followed by completion of the posterior and chamfer cuts. Femoral cutting guide for the central box was then positioned in the center box cut was performed.  Attention was then directed to the proximal tibia. Medial and lateral menisci were excised. The extramedullary tibial cutting guide was positioned using computer-assisted navigation so as to achieve a 0 varus-valgus alignment and 3 posterior slope. The cut was performed and  verified using the computer. The proximal tibia was  sized and it was felt that a size 7 tibial tray was appropriate. Tibial and femoral trials were inserted followed by insertion of a 10 mm polyethylene insert. This allowed for excellent mediolateral soft tissue balancing both in flexion and in full extension. Finally, the patella was cut and prepared so as to accommodate a 38 mm medialized dome patella. A patella trial was placed and the knee was placed through a range of motion with excellent patellar tracking appreciated. The femoral trial was removed after debridement of posterior osteophytes. The central post-hole for the tibial component was reamed followed by insertion of a keel punch. Tibial trials were then removed. Cut surfaces of bone were irrigated with copious amounts of normal saline using pulsatile lavage and then suctioned dry. Polymethylmethacrylate cement was prepared in the usual fashion using a vacuum mixer. Cement was applied to the cut surface of the proximal tibia as well as along the undersurface of a size 7 rotating platform tibial component. Tibial component was positioned and impacted into place. Excess cement was removed using [employer] fast streamer. Cement was then applied to the cut surfaces of the femur as well as along the posterior flanges of the size 7 femoral component. The femoral component was positioned and impacted into place. Excess cement was removed using [employer] fast streamer. A 10 mm polyethylene trial was inserted and the knee was brought into full extension with steady axial compression applied. Finally, cement was applied to the backside of a 38 mm medialized dome patella and the patellar component was positioned and patellar clamp applied. Excess cement was removed using [employer] fast streamer. After adequate curing of the cement, the tourniquet was deflated after a total tourniquet time of 93 minutes. Hemostasis was achieved using electrocautery. The knee was irrigated with copious  amounts of normal saline using pulsatile lavage followed by 500 ml of Surgiphor and then suctioned dry. 20 mL of 1.3% Exparel and 60 mL of 0.25% Marcaine in 40 mL of normal saline was injected along the posterior capsule, medial and lateral gutters, and along the arthrotomy site. A 10 mm stabilized rotating platform polyethylene insert was inserted and the knee was placed through a range of motion with excellent mediolateral soft tissue balancing appreciated and excellent patellar tracking noted. 2 medium drains were placed in the wound bed and brought out through separate stab incisions. The medial parapatellar portion of the incision was reapproximated using interrupted sutures of #1 Vicryl. Subcutaneous tissue was approximated in layers using first #0 Vicryl followed #2-0 Vicryl. The skin was approximated with skin staples. A sterile dressing was applied.  The patient tolerated the procedure well and was transported to the recovery room in stable condition.    Rupert Azzara P. Holley Bouche., M.D.

## 2021-06-17 NOTE — Plan of Care (Signed)
  Problem: Education: Goal: Knowledge of General Education information will improve Description: Including pain rating scale, medication(s)/side effects and non-pharmacologic comfort measures Outcome: Progressing   Problem: Health Behavior/Discharge Planning: Goal: Ability to manage health-related needs will improve Outcome: Progressing   Problem: Clinical Measurements: Goal: Ability to maintain clinical measurements within normal limits will improve Outcome: Progressing Goal: Will remain free from infection Outcome: Progressing Goal: Diagnostic test results will improve Outcome: Progressing Goal: Respiratory complications will improve Outcome: Progressing Goal: Cardiovascular complication will be avoided Outcome: Progressing   Problem: Nutrition: Goal: Adequate nutrition will be maintained Outcome: Progressing   Problem: Coping: Goal: Level of anxiety will decrease Outcome: Progressing   Problem: Elimination: Goal: Will not experience complications related to bowel motility Outcome: Progressing Goal: Will not experience complications related to urinary retention Outcome: Progressing   Problem: Pain Managment: Goal: General experience of comfort will improve Outcome: Progressing   Problem: Safety: Goal: Ability to remain free from injury will improve Outcome: Progressing   Problem: Education: Goal: Knowledge of the prescribed therapeutic regimen will improve Outcome: Progressing Goal: Individualized Educational Video(s) Outcome: Progressing   Problem: Activity: Goal: Ability to avoid complications of mobility impairment will improve Outcome: Progressing Goal: Range of joint motion will improve Outcome: Progressing   Problem: Clinical Measurements: Goal: Postoperative complications will be avoided or minimized Outcome: Progressing   Problem: Skin Integrity: Goal: Will show signs of wound healing Outcome: Progressing

## 2021-06-17 NOTE — Anesthesia Preprocedure Evaluation (Signed)
Anesthesia Evaluation  Patient identified by MRN, date of birth, ID band Patient awake    Reviewed: Allergy & Precautions, H&P , NPO status , Patient's Chart, lab work & pertinent test results  History of Anesthesia Complications Negative for: history of anesthetic complications  Airway Mallampati: III  TM Distance: >3 FB Neck ROM: full    Dental  (+) Missing, Dental Advidsory Given   Pulmonary neg shortness of breath, sleep apnea , neg recent URI, former smoker,    Pulmonary exam normal        Cardiovascular Exercise Tolerance: Good hypertension, (-) angina+ CAD and + Past MI  Normal cardiovascular exam     Neuro/Psych negative neurological ROS  negative psych ROS   GI/Hepatic Neg liver ROS, GERD  Medicated and Controlled,  Endo/Other  negative endocrine ROS  Renal/GU      Musculoskeletal  (+) Arthritis ,   Abdominal   Peds  Hematology negative hematology ROS (+)   Anesthesia Other Findings Past Medical History: No date: Coronary artery disease No date: GERD (gastroesophageal reflux disease) No date: History of kidney stones No date: Hypertension 01/31/2016: Nuclear sclerotic cataract of right eye 09/16/2020: Osteoarthritis of left knee  Past Surgical History: 1990's: CARDIAC CATHETERIZATION     Comment:  POBA 2019: CATARACT EXTRACTION W/ INTRAOCULAR LENS  IMPLANT, BILATERAL;  Bilateral 2018: COLONOSCOPY 1976: KNEE ARTHROSCOPY; Left 1960: TONSILLECTOMY     Reproductive/Obstetrics negative OB ROS                             Anesthesia Physical  Anesthesia Plan  ASA: 3  Anesthesia Plan: Spinal   Post-op Pain Management:    Induction: Intravenous  PONV Risk Score and Plan: TIVA and Propofol infusion  Airway Management Planned: Natural Airway and Nasal Cannula  Additional Equipment:   Intra-op Plan:   Post-operative Plan:   Informed Consent: I have reviewed  the patients History and Physical, chart, labs and discussed the procedure including the risks, benefits and alternatives for the proposed anesthesia with the patient or authorized representative who has indicated his/her understanding and acceptance.     Dental Advisory Given  Plan Discussed with: Anesthesiologist, CRNA and Surgeon  Anesthesia Plan Comments: (Patient reports no bleeding problems and no anticoagulant use.  Plan for spinal with backup GA  Patient consented for risks of anesthesia including but not limited to:  - adverse reactions to medications - damage to eyes, teeth, lips or other oral mucosa - nerve damage due to positioning  - risk of bleeding, infection and or nerve damage from spinal that could lead to paralysis - risk of headache or failed spinal - damage to teeth, lips or other oral mucosa - sore throat or hoarseness - damage to heart, brain, nerves, lungs, other parts of body or loss of life  Patient voiced understanding.)        Anesthesia Quick Evaluation

## 2021-06-17 NOTE — Transfer of Care (Signed)
Immediate Anesthesia Transfer of Care Note  Patient: Luke Wall  Procedure(s) Performed: COMPUTER ASSISTED TOTAL KNEE ARTHROPLASTY (Right: Knee)  Patient Location: PACU  Anesthesia Type:Spinal  Level of Consciousness: drowsy  Airway & Oxygen Therapy: Patient Spontanous Breathing and Patient connected to face mask oxygen  Post-op Assessment: Report given to RN and Post -op Vital signs reviewed and stable  Post vital signs: Reviewed and stable  Last Vitals:  Vitals Value Taken Time  BP 91/54 06/17/21 1048  Temp    Pulse 49 06/17/21 1049  Resp 9 06/17/21 1049  SpO2 99 % 06/17/21 1049  Vitals shown include unvalidated device data.  Last Pain:  Vitals:   06/17/21 0619  TempSrc: Temporal  PainSc: 0-No pain         Complications: No notable events documented.

## 2021-06-17 NOTE — Progress Notes (Addendum)
   06/17/21 1315  Clinical Encounter Type  Visited With Patient and family together  Visit Type Initial  Referral From Nurse  Consult/Referral To Chaplain  Spiritual Encounters  Spiritual Needs Brochure  Chaplain Annemarie Sebree and [place] completed an AD for room 148A Pt, Mr. Luke Wall. Pt and partner was very pleasant and receptive of visit.   Health Care Agent :Anastasio Auerbach [phone]

## 2021-06-17 NOTE — Anesthesia Procedure Notes (Signed)
Date/Time: 06/17/2021 7:22 AM Performed by: Lily Peer, Winni Ehrhard, CRNA Pre-anesthesia Checklist: Patient identified, Emergency Drugs available, Suction available, Patient being monitored and Timeout performed Patient Re-evaluated:Patient Re-evaluated prior to induction Oxygen Delivery Method: Simple face mask Induction Type: IV induction

## 2021-06-17 NOTE — Anesthesia Procedure Notes (Signed)
Spinal  Patient location during procedure: OR Start time: 06/17/2021 7:19 AM End time: 06/17/2021 7:22 AM Reason for block: surgical anesthesia Staffing Performed: resident/CRNA  Anesthesiologist: Martha Clan, MD Resident/CRNA: Norm Salt, CRNA Preanesthetic Checklist Completed: patient identified, IV checked, site marked, risks and benefits discussed, surgical consent, monitors and equipment checked and pre-op evaluation Spinal Block Patient position: sitting Prep: ChloraPrep Patient monitoring: heart rate, continuous pulse ox and blood pressure Approach: midline Location: L3-4 Injection technique: single-shot Needle Needle type: Pencan  Needle gauge: 24 G Needle length: 10 cm Assessment Sensory level: T4 Events: CSF return

## 2021-06-17 NOTE — H&P (Signed)
The patient has been re-examined, and the chart reviewed, and there have been no interval changes to the documented history and physical.    The risks, benefits, and alternatives have been discussed at length. The patient expressed understanding of the risks benefits and agreed with plans for surgical intervention.  Sana Tessmer P. Nyjah Schwake, Jr. M.D.    

## 2021-06-18 DIAGNOSIS — I2510 Atherosclerotic heart disease of native coronary artery without angina pectoris: Secondary | ICD-10-CM | POA: Diagnosis not present

## 2021-06-18 DIAGNOSIS — Z96659 Presence of unspecified artificial knee joint: Secondary | ICD-10-CM | POA: Diagnosis not present

## 2021-06-18 DIAGNOSIS — I10 Essential (primary) hypertension: Secondary | ICD-10-CM | POA: Diagnosis not present

## 2021-06-18 DIAGNOSIS — Z23 Encounter for immunization: Secondary | ICD-10-CM | POA: Diagnosis not present

## 2021-06-18 DIAGNOSIS — Z7982 Long term (current) use of aspirin: Secondary | ICD-10-CM | POA: Diagnosis not present

## 2021-06-18 DIAGNOSIS — Z79899 Other long term (current) drug therapy: Secondary | ICD-10-CM | POA: Diagnosis not present

## 2021-06-18 DIAGNOSIS — M1711 Unilateral primary osteoarthritis, right knee: Secondary | ICD-10-CM | POA: Diagnosis not present

## 2021-06-18 DIAGNOSIS — F17210 Nicotine dependence, cigarettes, uncomplicated: Secondary | ICD-10-CM | POA: Diagnosis not present

## 2021-06-18 DIAGNOSIS — Z96652 Presence of left artificial knee joint: Secondary | ICD-10-CM | POA: Diagnosis not present

## 2021-06-18 MED ORDER — ENOXAPARIN SODIUM 40 MG/0.4ML IJ SOSY: 40.0000 mg | PREFILLED_SYRINGE | INTRAMUSCULAR | 0 refills | Status: DC

## 2021-06-18 MED ORDER — OXYCODONE HCL 5 MG PO TABS: 5.0000 mg | ORAL_TABLET | ORAL | 0 refills | Status: DC | PRN

## 2021-06-18 MED ORDER — CELECOXIB 200 MG PO CAPS: 200.0000 mg | ORAL_CAPSULE | Freq: Two times a day (BID) | ORAL | 0 refills | Status: DC

## 2021-06-18 NOTE — TOC Benefit Eligibility Note (Signed)
Transition of Care [facility]) Benefit Eligibility Note    Patient Details  Name: Luke Wall MRN: [id number] Date of Birth: 10-08-1946   Medication/Dose: ENOXAPARIN   40 MG DAILY X 14 DAYS  SYRINGES  Covered?: Yes  Tier:  (TIER- 4 DRUG)  Prescription Coverage Preferred Pharmacy: [facility] [address]  Spoke with Person/Company/Phone Number:: [phone]  @  [employer]  RX # [phone]  Co-Pay: $95.00  Prior Approval: No  Deductible:  (NO DEDUCTIBLE WITH PLAN  and  OUT-OF-POCKET :UNMET)  Additional Notes: LOVENOX  40 MG DAILY X 14 DAYS SYRINGES: NOT COVER/ NON-FORMULARY  P/A - YES # [id number]    Memory Argue Phone Number: 06/18/2021, 11:54 AM

## 2021-06-18 NOTE — Anesthesia Postprocedure Evaluation (Signed)
Anesthesia Post Note  Patient: Luke Wall  Procedure(s) Performed: COMPUTER ASSISTED TOTAL KNEE ARTHROPLASTY (Right: Knee)  Patient location during evaluation: Nursing Unit Anesthesia Type: Spinal Level of consciousness: awake, awake and alert and oriented Pain management: pain level controlled Vital Signs Assessment: post-procedure vital signs reviewed and stable Respiratory status: spontaneous breathing, respiratory function stable and nonlabored ventilation Cardiovascular status: blood pressure returned to baseline and stable Postop Assessment: no headache, no backache and adequate PO intake Anesthetic complications: no   No notable events documented.   Last Vitals:  Vitals:   06/18/21 0526 06/18/21 0732  BP: (!) 157/68 (!) 168/74  Pulse: (!) 49 (!) 50  Resp: 16 16  Temp: 36.6 C 37 C  SpO2: 99% 99%    Last Pain:  Vitals:   06/18/21 0625  TempSrc:   PainSc: Asleep                 Johnna Acosta

## 2021-06-18 NOTE — Discharge Summary (Signed)
Physician Discharge Summary  Patient ID: Luke Wall MRN: [id number] DOB/AGE: 76-20-1947 76 y.o.  Admit date: 06/17/2021 Discharge date: 06/18/2021  Admission Diagnoses:  Total knee replacement status [Z96.659]  Surgeries:Procedure(s): Right total knee arthroplasty using computer-assisted navigation   SURGEON:  Marciano Sequin. M.D.   ASSISTANT: Cassell Smiles, PA-C (present and scrubbed throughout the case, critical for assistance with exposure, retraction, instrumentation, and closure)   ANESTHESIA: spinal   ESTIMATED BLOOD LOSS: 50 mL   FLUIDS REPLACED: 1000 mL of crystalloid   TOURNIQUET TIME: 9. minutes   DRAINS: 2 medium Hemovac drains   SOFT TISSUE RELEASES: Anterior cruciate ligament, posterior cruciate ligament, deep medial collateral ligament, patellofemoral ligament   IMPLANTS UTILIZED: DePuy Attune size 7 posterior stabilized femoral component (cemented), size 7 rotating platform tibial component (cemented), 38 mm medialized dome patella (cemented), and a 10 mm stabilized rotating platform polyethylene insert.  Discharge Diagnoses: Patient Active Problem List   Diagnosis Date Noted   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   Status post total left knee replacement 02/08/2021   Coronary artery disease involving native coronary artery of native heart with angina pectoris ([place]) 08/09/2019   Thrombocytopenia ([place]) 05/31/2019   Class 1 obesity due to excess calories with serious comorbidity and body mass index (BMI) of 31.0 to 31.9 in adult 11/23/2018   Gastroesophageal reflux disease without esophagitis 11/23/2018   Impaired fasting glucose 09/17/2017   Essential hypertension 12/23/2016   Nuclear sclerotic cataract of right eye 01/31/2016    Past Medical History:  Diagnosis Date   Coronary artery disease    GERD (gastroesophageal reflux disease)    History of kidney stones    Hypertension    Nuclear sclerotic cataract of right eye 01/31/2016   Osteoarthritis  of left knee 09/16/2020     Transfusion:    Consultants (if any):   Discharged Condition: Improved  Hospital Course: Luke Wall is an 75 y.o. male who was admitted 06/17/2021 with a diagnosis of right knee osteoarthritis and went to the operating room on 06/17/2021 and underwent right total knee arthroplasty. The patient received perioperative antibiotics for prophylaxis (see below). The patient tolerated the procedure well and was transported to PACU in stable condition. After meeting PACU criteria, the patient was subsequently transferred to the Orthopaedics/Rehabilitation unit.   The patient received DVT prophylaxis in the form of early mobilization, Lovenox, Foot Pumps, and TED hose. A sacral pad had been placed and heels were elevated off of the bed with rolled towels in order to protect skin integrity. Foley catheter was discontinued on postoperative day #0. Wound drains were discontinued on postoperative day #1. The surgical incision was healing well without signs of infection.  Physical therapy was initiated postoperatively for transfers, gait training, and strengthening. Occupational therapy was initiated for activities of daily living and evaluation for assisted devices. Rehabilitation goals were reviewed in detail with the patient. The patient made steady progress with physical therapy and physical therapy recommended discharge to Home.   The patient achieved the preliminary goals of this hospitalization and was felt to be medically and orthopaedically appropriate for discharge.  He was given perioperative antibiotics:  Anti-infectives (From admission, onward)    Start     Dose/Rate Route Frequency Ordered Stop   06/17/21 1330  ceFAZolin (ANCEF) IVPB 2g/100 mL premix        2 g 200 mL/hr over 30 Minutes Intravenous Every 6 hours 06/17/21 1200 06/18/21 0028   06/17/21 0615  ceFAZolin (ANCEF) 2-4 GM/100ML-%  IVPB       Note to Pharmacy: Trudie Reed   : cabinet override      06/17/21  0615 06/17/21 0734   06/17/21 0600  ceFAZolin (ANCEF) IVPB 2g/100 mL premix        2 g 200 mL/hr over 30 Minutes Intravenous On call to O.R. 06/16/21 2304 06/17/21 0734     .  Recent vital signs:  Vitals:   06/18/21 0732 06/18/21 1158  BP: (!) 168/74 (!) 143/68  Pulse: (!) 50 (!) 52  Resp: 16 16  Temp: 98.6 F (37 C) 97.7 F (36.5 C)  SpO2: 99% 100%    Recent laboratory studies:  No results for input(s): WBC, HGB, HCT, PLT, K, CL, CO2, BUN, CREATININE, GLUCOSE, CALCIUM, LABPT, INR in the last 72 hours.  Diagnostic Studies: DG Knee Right Port  Result Date: 06/17/2021 CLINICAL DATA:  Total knee arthroplasty EXAM: PORTABLE RIGHT KNEE - 1-2 VIEW COMPARISON:  02/08/2021 FINDINGS: Total knee arthroplasty which is well seated. No periprosthetic fracture or subluxation. IMPRESSION: No complicating features of total knee arthroplasty. Electronically Signed   By: Monte Fantasia M.D.   On: 06/17/2021 11:11    Discharge Medications:   Allergies as of 06/18/2021   No Known Allergies      Medication List     STOP taking these medications    aspirin EC 81 MG tablet       TAKE these medications    acetaminophen 500 MG tablet Commonly known as: TYLENOL Take 1,000 mg by mouth every 6 (six) hours as needed for moderate pain.   atenolol 50 MG tablet Commonly known as: TENORMIN Take 50 mg by mouth daily.   celecoxib 200 MG capsule Commonly known as: CELEBREX Take 1 capsule (200 mg total) by mouth 2 (two) times daily.   diclofenac Sodium 1 % Gel Commonly known as: VOLTAREN Apply 2 g topically daily as needed (knee pain).   enoxaparin 40 MG/0.4ML injection Commonly known as: LOVENOX Inject 0.4 mLs (40 mg total) into the skin daily for 14 days.   Melatonin 10 MG Tabs Take 1 tablet by mouth at bedtime as needed.   omeprazole 20 MG capsule Commonly known as: PRILOSEC Take 20 mg by mouth daily.   oxyCODONE 5 MG immediate release tablet Commonly known as: Oxy  IR/ROXICODONE Take 1 tablet (5 mg total) by mouth every 4 (four) hours as needed for moderate pain (pain score 4-6).   rosuvastatin 10 MG tablet Commonly known as: CRESTOR Take 10 mg by mouth daily.       ASK your doctor about these medications    celecoxib 200 MG capsule Commonly known as: CELEBREX Take 1 capsule (200 mg total) by mouth 2 (two) times daily.               Durable Medical Equipment  (From admission, onward)           Start     Ordered   06/17/21 1201  DME Walker rolling  Once       Question:  Patient needs a walker to treat with the following condition  Answer:  Total knee replacement status   06/17/21 1200   06/17/21 1201  DME Bedside commode  Once       Question:  Patient needs a bedside commode to treat with the following condition  Answer:  Total knee replacement status   06/17/21 1200            Disposition: Home with home  health PT     Follow-up Information     Fausto Skillern, PA-C Follow up on 07/02/2021.   Specialty: Orthopedic Surgery Why: at 9:45am Contact information: [place] [address] [phone]         Dereck Leep, MD Follow up on 07/30/2021.   Specialty: Orthopedic Surgery Why: at 3:00pm Contact information: [place] [address] [place], PA-C 06/18/2021, 1:23 PM

## 2021-06-18 NOTE — Evaluation (Signed)
Occupational Therapy Evaluation Patient Details Name: Luke Wall MRN: [id number] DOB: 08-08-1946 Today's Date: 06/18/2021    History of Present Illness Pt is a 75 y.o. M s/p R TKA on 06/17/21. PMH includes L TKA on 01/2021, GERD, HTN, CAD, & past MI   Clinical Impression   Pt seen for OT evaluation this date, POD#1 from above surgery. Spouse present and supportive. Pt was independent in all ADL prior to surgery, however using SPC due to R knee pain. Pt is eager to return to PLOF with less pain and improved safety and independence. Pt currently requires PRN minimal assist for LB dressing and bathing while in seated position due to pain and limited AROM of R knee. Pt/spouse educated in role of OT, provided with handout for adaptive strategies, falls prevention, and AE/DME s/p TKA. Both verbalized understanding, pt endorses feeling confident after recent L TKA a few months ago. Pt completed bed mobility with modified independence. PT in room for further mobility. Do not currently anticipate any additional skilled OT needs at this time. Will sign off.      Follow Up Recommendations  No OT follow up    Equipment Recommendations  None recommended by OT    Recommendations for Other Services       Precautions / Restrictions Precautions Precautions: Knee;Fall Precaution Booklet Issued: Yes (comment) Restrictions Weight Bearing Restrictions: Yes RLE Weight Bearing: Weight bearing as tolerated      Mobility Bed Mobility Overal bed mobility: Needs Assistance Bed Mobility: Supine to Sit     Supine to sit: Modified independent (Device/Increase time)     General bed mobility comments: no difficulty    Transfers                 General transfer comment: deferred, PT in room for session    Balance Overall balance assessment: Needs assistance Sitting-balance support: No upper extremity supported;Feet supported Sitting balance-Leahy Scale: Good                                      ADL either performed or assessed with clinical judgement   ADL Overall ADL's : Needs assistance/impaired                                       General ADL Comments: Pt currently requires PRN MIN A for LB ADL tasks 2/2 decreased strength/ROM of R knee, spouse able to assist     Vision Patient Visual Report: No change from baseline       Perception     Praxis      Pertinent Vitals/Pain Pain Assessment: No/denies pain     Hand Dominance Right   Extremity/Trunk Assessment Upper Extremity Assessment Upper Extremity Assessment: Overall WFL for tasks assessed   Lower Extremity Assessment Lower Extremity Assessment: RLE deficits/detail RLE Deficits / Details: post-surgical strength/ROM appreciated       Communication Communication Communication: No difficulties   Cognition Arousal/Alertness: Awake/alert Behavior During Therapy: WFL for tasks assessed/performed Overall Cognitive Status: Within Functional Limits for tasks assessed                                     General Comments       Exercises Other Exercises Other Exercises:  Pt/spouse educated in role of OT, provided with handout for adaptive strategies, falls prevention, and AE/DME s/p TKA   Shoulder Instructions      Home Living Family/patient expects to be discharged to:: Private residence Living Arrangements: Spouse/significant other Available Help at Discharge: Family;Available 24 hours/day Type of Home: House Home Access: Stairs to enter [employer] of Steps: 2 Entrance Stairs-Rails: Right;Left;Can reach both Home Layout: One level     Bathroom Shower/Tub: Teacher, early years/pre: Handicapped height     Home Equipment: Cane - single point;Shower seat;Bedside commode          Prior Functioning/Environment Level of Independence: Independent with assistive device(s)        Comments: Independent ADLs, and IADLs, SPC for  ambulation        OT Problem List: Decreased strength;Decreased range of motion      OT Treatment/Interventions:      OT Goals(Current goals can be found in the care plan section) Acute Rehab OT Goals Patient Stated Goal: To go home OT Goal Formulation: All assessment and education complete, DC therapy  OT Frequency:     Barriers to D/C:            Co-evaluation              AM-PAC OT "6 Clicks" Daily Activity     Outcome Measure Help from another person eating meals?: None Help from another person taking care of personal grooming?: None Help from another person toileting, which includes using toliet, bedpan, or urinal?: A Little Help from another person bathing (including washing, rinsing, drying)?: A Little Help from another person to put on and taking off regular upper body clothing?: None Help from another person to put on and taking off regular lower body clothing?: A Little 6 Click Score: 21   End of Session    Activity Tolerance: Patient tolerated treatment well Patient left: Other (comment) (seated EOB with PT)  OT Visit Diagnosis: Other abnormalities of gait and mobility (R26.89)                Time: [id number] OT Time Calculation (min): 11 min Charges:  OT General Charges $OT Visit: 1 Visit OT Evaluation $OT Eval Low Complexity: 1 Low  Luke Wall, MPH, MS, OTR/L ascom [phone] 06/18/21, 9:46 AM

## 2021-06-18 NOTE — Progress Notes (Signed)
Physical Therapy Treatment Patient Details Name: Luke Wall MRN: [id number] DOB: [birth year] Today's Date: 06/18/2021    History of Present Illness Pt is a 75 y.o. M s/p R TKA on 06/17/21. PMH includes L TKA on 01/2021, GERD, HTN, CAD, & past MI    PT Comments    Pt did well with all aspects of PT session, very confident with all mobility - to the point where he needed cues to insure he would slow and be more deliberate/safe with activity.  He displayed good strength, ROM, safety, etc and did very well with prolonged bout of ambulation and stair negotiation.  Pt eager to work with PT and to get home.     Follow Up Recommendations  Home health PT     Equipment Recommendations  Rolling walker with 5" wheels    Recommendations for Other Services       Precautions / Restrictions Precautions Precautions: Knee;Fall Precaution Booklet Issued: Yes (comment) Precaution Comments: HEP Restrictions RLE Weight Bearing: Weight bearing as tolerated    Mobility  Bed Mobility Overal bed mobility: Modified Independent Bed Mobility: Supine to Sit           General bed mobility comments: easily moves in bed w/o assist    Transfers Overall transfer level: Needs assistance Equipment used: Rolling walker (2 wheeled) Transfers: Sit to/from Stand Sit to Stand: Supervision         General transfer comment: easily able to rise to standing with light cuing for UE placement, no LOBs or safety concerns  Ambulation/Gait Ambulation/Gait assistance: Min guard Gait Distance (Feet): 250 Feet Assistive device: Rolling walker (2 wheeled)       General Gait Details: Pt is able to maintain consistent cadence and speed t/o the entirety of the effort.  No hesitancy with WBing, good confidence.  did actually need cues to slow and be more deliberate with careful ambulation   Stairs Stairs: Yes Stairs assistance: Supervision   Number of Stairs: 4 General stair comments: multiple scenarios for  stair negotiation.  Instructed on home simulation with single step to platform with walker - able to manage w/o issue.  Additionally negotiated up/down 4 steps with rails again w/o need for assist or issue.  Pt recalled appropriate sequencing from prior replacement, performed with good confidence.   Wheelchair Mobility    Modified Rankin (Stroke Patients Only)       Balance Overall balance assessment: Needs assistance Sitting-balance support: No upper extremity supported;Feet supported Sitting balance-Leahy Scale: Good     Standing balance support: Bilateral upper extremity supported (able to confidently and safely maintain balance w/o UEs while washing hands) Standing balance-Leahy Scale: Good                              Cognition Arousal/Alertness: Awake/alert Behavior During Therapy: WFL for tasks assessed/performed Overall Cognitive Status: Within Functional Limits for tasks assessed                                        Exercises Total Joint Exercises Quad Sets: Strengthening;15 reps Heel Slides: 10 reps;Strengthening Hip ABduction/ADduction: Strengthening;10 reps Straight Leg Raises: Strengthening;10 reps (light resist at top) Long Arc Quad: Strengthening;10 reps Knee Flexion: PROM;5 reps Goniometric ROM: 0-94    General Comments        Pertinent Vitals/Pain Pain Assessment: 0-10 Pain Score: 3  Home Living                      Prior Function            PT Goals (current goals can now be found in the care plan section) Progress towards PT goals: Progressing toward goals    Frequency    BID      PT Plan Current plan remains appropriate    Co-evaluation              AM-PAC PT "6 Clicks" Mobility   Outcome Measure  Help needed turning from your back to your side while in a flat bed without using bedrails?: A Little Help needed moving from lying on your back to sitting on the side of a flat bed  without using bedrails?: A Little Help needed moving to and from a bed to a chair (including a wheelchair)?: A Little Help needed standing up from a chair using your arms (e.g., wheelchair or bedside chair)?: A Little Help needed to walk in hospital room?: A Lot Help needed climbing 3-5 steps with a railing? : A Lot 6 Click Score: 16    End of Session Equipment Utilized During Treatment: Gait belt Activity Tolerance: Patient tolerated treatment well Patient left: with chair alarm set;with family/visitor present;with call bell/phone within reach Nurse Communication: Mobility status PT Visit Diagnosis: Other abnormalities of gait and mobility (R26.89);Muscle weakness (generalized) (M62.81)     Time: [id number] PT Time Calculation (min) (ACUTE ONLY): 33 min  Charges:  $Gait Training: 8-22 mins $Therapeutic Exercise: 8-22 mins                     Kreg Shropshire, DPT 06/18/2021, 3:06 PM

## 2021-06-18 NOTE — Plan of Care (Signed)

## 2021-06-18 NOTE — TOC Initial Note (Signed)
Transition of Care [facility]) - Initial/Assessment Note    Patient Details  Name: Luke Wall MRN: [id number] Date of Birth: [birth year]  Transition of Care [facility] CM/SW Contact:    Su Hilt, RN Phone Number: 06/18/2021, 10:20 AM  Clinical Narrative:                 Met with the patient and his wife at the bedside, He has a BSC, cane, and shower seat.  He has transportation with his wife, he can afford his medications  He will have a Rolling walker brought into his room buy [employer],  He is set up with [facility] pt   Expected Discharge Plan: [place] Barriers to Discharge: Barriers Resolved   Patient Goals and CMS Choice Patient states their goals for this hospitalization and ongoing recovery are:: go home      Expected Discharge Plan and Services Expected Discharge Plan: [place]   Discharge Planning Services: CM Consult   Living arrangements for the past 2 months: Single Family Home                 DME Arranged: Walker rolling DME Agency: [employer] Date DME Agency Contacted: 06/18/21 Time DME Agency Contacted: 47 Representative spoke with at DME Agency: [place]: PT [place]: [facility]) Date [place]: 06/18/21 Time HH Agency Contacted: 108 Representative spoke with at [place]: [place]  Prior Living Arrangements/Services Living arrangements for the past 2 months: [place] with:: Spouse Patient language and need for interpreter reviewed:: Yes Do you feel safe going back to the place where you live?: Yes      Need for Family Participation in Patient Care: Yes (Comment) Care giver support system in place?: Yes (comment) Current home services: DME (Bed side COmmode, Cane, shower seat) Criminal Activity/Legal Involvement Pertinent to Current Situation/Hospitalization: No - Comment as needed  Activities of Daily Living Home Assistive  Devices/Equipment: Cane (specify quad or straight) ADL Screening (condition at time of admission) Patient's cognitive ability adequate to safely complete daily activities?: Yes Is the patient deaf or have difficulty hearing?: No Does the patient have difficulty seeing, even when wearing glasses/contacts?: No Does the patient have difficulty concentrating, remembering, or making decisions?: No Patient able to express need for assistance with ADLs?: Yes Does the patient have difficulty dressing or bathing?: No Independently performs ADLs?: Yes (appropriate for developmental age) Does the patient have difficulty walking or climbing stairs?: No Weakness of Legs: None Weakness of Arms/Hands: None  Permission Sought/Granted   Permission granted to share information with : Yes, Verbal Permission Granted     Permission granted to share info w AGENCY: [facility], [facility]        Emotional Assessment Appearance:: Appears stated age Attitude/Demeanor/Rapport: Engaged Affect (typically observed): Appropriate Orientation: : Oriented to Situation, Oriented to  Time, Oriented to Place, Oriented to Self Alcohol / Substance Use: Not Applicable Psych Involvement: No (comment)  Admission diagnosis:  Total knee replacement status [Z96.659] Patient Active Problem List   Diagnosis Date Noted   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   Status post total left knee replacement 02/08/2021   Coronary artery disease involving native coronary artery of native heart with angina pectoris ([place]) 08/09/2019   Thrombocytopenia ([place]) 05/31/2019   Class 1 obesity due to excess calories with serious comorbidity and body mass index (BMI) of 31.0 to 31.9 in adult 11/23/2018   Gastroesophageal  reflux disease without esophagitis 11/23/2018   Impaired fasting glucose 09/17/2017   Essential hypertension 12/23/2016   Nuclear sclerotic cataract of right eye 01/31/2016   PCP:  [place] Pink, MD Pharmacy:   [place] [place] [facility] AT [facility] [address] [place] [address] Phone: [phone] Fax: [phone]     Social Determinants of Health (SDOH) Interventions    Readmission Risk Interventions No flowsheet data found.

## 2021-06-18 NOTE — Progress Notes (Addendum)
Physical Therapy Treatment Patient Details Name: Luke Wall MRN: [id number] DOB: [birth year] Today's Date: 06/18/2021    History of Present Illness Pt is a 75 y.o. M s/p R TKA on 06/17/21. PMH includes L TKA on 01/2021, GERD, HTN, CAD, & past MI    PT Comments    Brief PM session prior to d/c.  Reviewed and performed HEP and educated on appropriate progress/intensity of activity.  Pt dressed and eager to go, states he has been up using bathroom, etc and did not wish to do another prolonged bout of ambulation.  He easily circumambulated the nurses' station and negotiated steps this AM.  Did need cues to slow and be more deliberate as he was almost too confident.  Again spent time educating on not over-doing-it, especially in the first few days.     Follow Up Recommendations  Follow surgeon's recommendation for DC plan and follow-up therapies     Equipment Recommendations  Rolling walker with 5" wheels    Recommendations for Other Services       Precautions / Restrictions Precautions Precautions: Knee;Fall Precaution Booklet Issued: Yes (comment) Precaution Comments: HEP Restrictions RLE Weight Bearing: Weight bearing as tolerated    Mobility  Bed Mobility Overal bed mobility: Modified Independent Bed Mobility: Supine to Sit           General bed mobility comments: pt sitting up at EOB, dressed and eager to discharge home in short order    Transfers Overall transfer level: Needs assistance Equipment used: Rolling walker (2 wheeled) Transfers: Sit to/from Stand Sit to Stand: Supervision         General transfer comment: easily able to rise to standing with light cuing for UE placement, no LOBs or safety concerns  Ambulation/Gait Ambulation/Gait assistance: Min guard Gait Distance (Feet): 250 Feet Assistive device: Rolling walker (2 wheeled)       General Gait Details: Pt is able to maintain consistent cadence and speed t/o the entirety of the effort.  No  hesitancy with WBing, good confidence.  did actually need cues to slow and be more deliberate with careful ambulation   Stairs Stairs: Yes Stairs assistance: Supervision   Number of Stairs: 4 General stair comments: multiple scenarios for stair negotiation.  Instructed on home simulation with single step to platform with walker - able to manage w/o issue.  Additionally negotiated up/down 4 steps with rails again w/o need for assist or issue.  Pt recalled appropriate sequencing from prior replacement, performed with good confidence.   Wheelchair Mobility    Modified Rankin (Stroke Patients Only)       Balance Overall balance assessment: Needs assistance Sitting-balance support: No upper extremity supported;Feet supported Sitting balance-Leahy Scale: Normal     Standing balance support: Bilateral upper extremity supported (able to confidently and safely maintain balance w/o UEs while washing hands) Standing balance-Leahy Scale: Good                              Cognition Arousal/Alertness: Awake/alert Behavior During Therapy: WFL for tasks assessed/performed Overall Cognitive Status: Within Functional Limits for tasks assessed                                        Exercises Total Joint Exercises Quad Sets: Strengthening;15 reps Heel Slides: 10 reps;Strengthening Hip ABduction/ADduction: Strengthening;10 reps Straight Leg Raises: Strengthening;10 reps (  light resist at top) Long Arc Quad: Strengthening;10 reps Knee Flexion: PROM;5 reps Goniometric ROM: 0-94 Other Exercises Other Exercises: educated on HEP and appropriate progression of activity and exercises.  Answered pt and wife's questions regarding expected progress and generally regarding mobility.  Pt performed most exercises and was able to show AROM knee flexion >100 with reports of only mild pain.  encouraged him to listen to his body as he progresses and to follow instructions from  [place].    General Comments        Pertinent Vitals/Pain Pain Assessment: 0-10 Pain Score: 2     Home Living                      Prior Function            PT Goals (current goals can now be found in the care plan section) Progress towards PT goals: Progressing toward goals    Frequency    BID      PT Plan Current plan remains appropriate    Co-evaluation              AM-PAC PT "6 Clicks" Mobility   Outcome Measure  Help needed turning from your back to your side while in a flat bed without using bedrails?: None Help needed moving from lying on your back to sitting on the side of a flat bed without using bedrails?: None Help needed moving to and from a bed to a chair (including a wheelchair)?: None Help needed standing up from a chair using your arms (e.g., wheelchair or bedside chair)?: None Help needed to walk in hospital room?: None Help needed climbing 3-5 steps with a railing? : None 6 Click Score: 24    End of Session Equipment Utilized During Treatment: Gait belt Activity Tolerance: Patient tolerated treatment well Patient left: with bed alarm set Nurse Communication: Mobility status PT Visit Diagnosis: Other abnormalities of gait and mobility (R26.89);Muscle weakness (generalized) (M62.81)     Time: 1340-1350 PT Time Calculation (min) (ACUTE ONLY): 10 min  Charges:  $Therapeutic Exercise: 8-22 mins                     Kreg Shropshire, DPT 06/18/2021, 3:49 PM

## 2021-06-18 NOTE — Progress Notes (Signed)
  Subjective: 1 Day Post-Op Procedure(s) (LRB): COMPUTER ASSISTED TOTAL KNEE ARTHROPLASTY (Right) Patient reports pain as well-controlled.   Patient is well, and has had no acute complaints or problems Plan is to go Home after hospital stay. Negative for chest pain and shortness of breath Fever: no Gastrointestinal: negative for nausea and vomiting.  Patient has not had a bowel movement.  Objective: Vital signs in last 24 hours: Temp:  [97.8 F (36.6 C)-98.6 F (37 C)] 98.6 F (37 C) (07/26 0732) Pulse Rate:  [48-59] 50 (07/26 0732) Resp:  [6-19] 16 (07/26 0732) BP: (91-168)/(50-74) 168/74 (07/26 0732) SpO2:  [99 %-100 %] 99 % (07/26 0732)  Intake/Output from previous day:  Intake/Output Summary (Last 24 hours) at 06/18/2021 0810 Last data filed at 06/18/2021 0605 Gross per 24 hour  Intake 1678.07 ml  Output 1570 ml  Net 108.07 ml    Intake/Output this shift: No intake/output data recorded.  Labs: No results for input(s): HGB in the last 72 hours. No results for input(s): WBC, RBC, HCT, PLT in the last 72 hours. No results for input(s): NA, K, CL, CO2, BUN, CREATININE, GLUCOSE, CALCIUM in the last 72 hours. No results for input(s): LABPT, INR in the last 72 hours.   EXAM General - Patient is Alert, Appropriate, and Oriented Extremity - Neurovascular intact Dorsiflexion/Plantar flexion intact Compartment soft Dressing/Incision -Postoperative dressing remains in place., Polar Care in place and working. , Hemovac in place.  Motor Function - intact, moving foot and toes well on exam. Able to perform independent SLR.  Cardiovascular-  bradycardic rate, regular rhythm Respiratory- Lungs clear to auscultation bilaterally Gastrointestinal- soft, nontender, and active bowel sounds   Assessment/Plan: 1 Day Post-Op Procedure(s) (LRB): COMPUTER ASSISTED TOTAL KNEE ARTHROPLASTY (Right) Active Problems:   Total knee replacement status  Estimated body mass index is 29.74  kg/m as calculated from the following:   Height as of 06/07/21: '5\' 7"'$  (1.702 m).   Weight as of 06/07/21: 86.1 kg. Advance diet Up with therapy  Possible d/c today pending completion of therapy goals.      DVT Prophylaxis - Lovenox, Ted hose, and foot pumps Weight-Bearing as tolerated to right leg  Cassell Smiles, PA-C [facility] 06/18/2021, 8:10 AM

## 2021-06-19 DIAGNOSIS — I2510 Atherosclerotic heart disease of native coronary artery without angina pectoris: Secondary | ICD-10-CM | POA: Diagnosis not present

## 2021-06-19 DIAGNOSIS — I10 Essential (primary) hypertension: Secondary | ICD-10-CM | POA: Diagnosis not present

## 2021-06-19 DIAGNOSIS — Z471 Aftercare following joint replacement surgery: Secondary | ICD-10-CM | POA: Diagnosis not present

## 2021-06-19 DIAGNOSIS — Z7901 Long term (current) use of anticoagulants: Secondary | ICD-10-CM | POA: Diagnosis not present

## 2021-06-19 DIAGNOSIS — I252 Old myocardial infarction: Secondary | ICD-10-CM | POA: Diagnosis not present

## 2021-06-19 DIAGNOSIS — Z6831 Body mass index (BMI) 31.0-31.9, adult: Secondary | ICD-10-CM | POA: Diagnosis not present

## 2021-06-19 DIAGNOSIS — K219 Gastro-esophageal reflux disease without esophagitis: Secondary | ICD-10-CM | POA: Diagnosis not present

## 2021-06-19 DIAGNOSIS — Z96651 Presence of right artificial knee joint: Secondary | ICD-10-CM | POA: Diagnosis not present

## 2021-06-21 DIAGNOSIS — I252 Old myocardial infarction: Secondary | ICD-10-CM | POA: Diagnosis not present

## 2021-06-21 DIAGNOSIS — I2510 Atherosclerotic heart disease of native coronary artery without angina pectoris: Secondary | ICD-10-CM | POA: Diagnosis not present

## 2021-06-21 DIAGNOSIS — Z471 Aftercare following joint replacement surgery: Secondary | ICD-10-CM | POA: Diagnosis not present

## 2021-06-21 DIAGNOSIS — Z7901 Long term (current) use of anticoagulants: Secondary | ICD-10-CM | POA: Diagnosis not present

## 2021-06-21 DIAGNOSIS — Z96651 Presence of right artificial knee joint: Secondary | ICD-10-CM | POA: Diagnosis not present

## 2021-06-21 DIAGNOSIS — Z6831 Body mass index (BMI) 31.0-31.9, adult: Secondary | ICD-10-CM | POA: Diagnosis not present

## 2021-06-21 DIAGNOSIS — I10 Essential (primary) hypertension: Secondary | ICD-10-CM | POA: Diagnosis not present

## 2021-06-21 DIAGNOSIS — K219 Gastro-esophageal reflux disease without esophagitis: Secondary | ICD-10-CM | POA: Diagnosis not present

## 2021-06-24 DIAGNOSIS — K219 Gastro-esophageal reflux disease without esophagitis: Secondary | ICD-10-CM | POA: Diagnosis not present

## 2021-06-24 DIAGNOSIS — Z6831 Body mass index (BMI) 31.0-31.9, adult: Secondary | ICD-10-CM | POA: Diagnosis not present

## 2021-06-24 DIAGNOSIS — I2510 Atherosclerotic heart disease of native coronary artery without angina pectoris: Secondary | ICD-10-CM | POA: Diagnosis not present

## 2021-06-24 DIAGNOSIS — Z7901 Long term (current) use of anticoagulants: Secondary | ICD-10-CM | POA: Diagnosis not present

## 2021-06-24 DIAGNOSIS — Z471 Aftercare following joint replacement surgery: Secondary | ICD-10-CM | POA: Diagnosis not present

## 2021-06-24 DIAGNOSIS — I10 Essential (primary) hypertension: Secondary | ICD-10-CM | POA: Diagnosis not present

## 2021-06-24 DIAGNOSIS — Z96651 Presence of right artificial knee joint: Secondary | ICD-10-CM | POA: Diagnosis not present

## 2021-06-24 DIAGNOSIS — I252 Old myocardial infarction: Secondary | ICD-10-CM | POA: Diagnosis not present

## 2021-06-26 DIAGNOSIS — Z6831 Body mass index (BMI) 31.0-31.9, adult: Secondary | ICD-10-CM | POA: Diagnosis not present

## 2021-06-26 DIAGNOSIS — I2510 Atherosclerotic heart disease of native coronary artery without angina pectoris: Secondary | ICD-10-CM | POA: Diagnosis not present

## 2021-06-26 DIAGNOSIS — Z471 Aftercare following joint replacement surgery: Secondary | ICD-10-CM | POA: Diagnosis not present

## 2021-06-26 DIAGNOSIS — Z7901 Long term (current) use of anticoagulants: Secondary | ICD-10-CM | POA: Diagnosis not present

## 2021-06-26 DIAGNOSIS — Z96651 Presence of right artificial knee joint: Secondary | ICD-10-CM | POA: Diagnosis not present

## 2021-06-26 DIAGNOSIS — I252 Old myocardial infarction: Secondary | ICD-10-CM | POA: Diagnosis not present

## 2021-06-26 DIAGNOSIS — K219 Gastro-esophageal reflux disease without esophagitis: Secondary | ICD-10-CM | POA: Diagnosis not present

## 2021-06-26 DIAGNOSIS — I10 Essential (primary) hypertension: Secondary | ICD-10-CM | POA: Diagnosis not present

## 2021-06-28 DIAGNOSIS — Z7901 Long term (current) use of anticoagulants: Secondary | ICD-10-CM | POA: Diagnosis not present

## 2021-06-28 DIAGNOSIS — Z96651 Presence of right artificial knee joint: Secondary | ICD-10-CM | POA: Diagnosis not present

## 2021-06-28 DIAGNOSIS — I2510 Atherosclerotic heart disease of native coronary artery without angina pectoris: Secondary | ICD-10-CM | POA: Diagnosis not present

## 2021-06-28 DIAGNOSIS — Z6831 Body mass index (BMI) 31.0-31.9, adult: Secondary | ICD-10-CM | POA: Diagnosis not present

## 2021-06-28 DIAGNOSIS — I252 Old myocardial infarction: Secondary | ICD-10-CM | POA: Diagnosis not present

## 2021-06-28 DIAGNOSIS — Z471 Aftercare following joint replacement surgery: Secondary | ICD-10-CM | POA: Diagnosis not present

## 2021-06-28 DIAGNOSIS — I10 Essential (primary) hypertension: Secondary | ICD-10-CM | POA: Diagnosis not present

## 2021-06-28 DIAGNOSIS — K219 Gastro-esophageal reflux disease without esophagitis: Secondary | ICD-10-CM | POA: Diagnosis not present

## 2021-07-01 DIAGNOSIS — Z6831 Body mass index (BMI) 31.0-31.9, adult: Secondary | ICD-10-CM | POA: Diagnosis not present

## 2021-07-01 DIAGNOSIS — I10 Essential (primary) hypertension: Secondary | ICD-10-CM | POA: Diagnosis not present

## 2021-07-01 DIAGNOSIS — Z96651 Presence of right artificial knee joint: Secondary | ICD-10-CM | POA: Diagnosis not present

## 2021-07-01 DIAGNOSIS — I2510 Atherosclerotic heart disease of native coronary artery without angina pectoris: Secondary | ICD-10-CM | POA: Diagnosis not present

## 2021-07-01 DIAGNOSIS — I252 Old myocardial infarction: Secondary | ICD-10-CM | POA: Diagnosis not present

## 2021-07-01 DIAGNOSIS — K219 Gastro-esophageal reflux disease without esophagitis: Secondary | ICD-10-CM | POA: Diagnosis not present

## 2021-07-01 DIAGNOSIS — Z471 Aftercare following joint replacement surgery: Secondary | ICD-10-CM | POA: Diagnosis not present

## 2021-07-01 DIAGNOSIS — Z7901 Long term (current) use of anticoagulants: Secondary | ICD-10-CM | POA: Diagnosis not present

## 2021-07-02 DIAGNOSIS — Z96651 Presence of right artificial knee joint: Secondary | ICD-10-CM | POA: Diagnosis not present

## 2021-07-02 DIAGNOSIS — M25661 Stiffness of right knee, not elsewhere classified: Secondary | ICD-10-CM | POA: Diagnosis not present

## 2021-07-02 DIAGNOSIS — M6281 Muscle weakness (generalized): Secondary | ICD-10-CM | POA: Diagnosis not present

## 2021-07-02 DIAGNOSIS — M25561 Pain in right knee: Secondary | ICD-10-CM | POA: Diagnosis not present

## 2021-07-04 DIAGNOSIS — Z96651 Presence of right artificial knee joint: Secondary | ICD-10-CM | POA: Diagnosis not present

## 2021-07-04 DIAGNOSIS — M25561 Pain in right knee: Secondary | ICD-10-CM | POA: Diagnosis not present

## 2021-07-08 DIAGNOSIS — M25561 Pain in right knee: Secondary | ICD-10-CM | POA: Diagnosis not present

## 2021-07-08 DIAGNOSIS — Z96651 Presence of right artificial knee joint: Secondary | ICD-10-CM | POA: Diagnosis not present

## 2021-07-11 DIAGNOSIS — Z96651 Presence of right artificial knee joint: Secondary | ICD-10-CM | POA: Diagnosis not present

## 2021-07-11 DIAGNOSIS — M25561 Pain in right knee: Secondary | ICD-10-CM | POA: Diagnosis not present

## 2021-07-16 DIAGNOSIS — M25561 Pain in right knee: Secondary | ICD-10-CM | POA: Diagnosis not present

## 2021-07-16 DIAGNOSIS — Z96651 Presence of right artificial knee joint: Secondary | ICD-10-CM | POA: Diagnosis not present

## 2021-07-18 DIAGNOSIS — Z96651 Presence of right artificial knee joint: Secondary | ICD-10-CM | POA: Diagnosis not present

## 2021-07-18 DIAGNOSIS — M25561 Pain in right knee: Secondary | ICD-10-CM | POA: Diagnosis not present

## 2021-07-22 DIAGNOSIS — Z96651 Presence of right artificial knee joint: Secondary | ICD-10-CM | POA: Diagnosis not present

## 2021-07-25 DIAGNOSIS — Z96651 Presence of right artificial knee joint: Secondary | ICD-10-CM | POA: Diagnosis not present

## 2021-07-25 DIAGNOSIS — M25561 Pain in right knee: Secondary | ICD-10-CM | POA: Diagnosis not present

## 2021-07-26 DIAGNOSIS — Z471 Aftercare following joint replacement surgery: Secondary | ICD-10-CM | POA: Diagnosis not present

## 2021-07-30 DIAGNOSIS — Z96651 Presence of right artificial knee joint: Secondary | ICD-10-CM | POA: Diagnosis not present

## 2021-07-30 DIAGNOSIS — M25561 Pain in right knee: Secondary | ICD-10-CM | POA: Diagnosis not present

## 2021-09-11 DIAGNOSIS — D696 Thrombocytopenia, unspecified: Secondary | ICD-10-CM | POA: Diagnosis not present

## 2021-09-11 DIAGNOSIS — K219 Gastro-esophageal reflux disease without esophagitis: Secondary | ICD-10-CM | POA: Diagnosis not present

## 2021-09-11 DIAGNOSIS — R152 Fecal urgency: Secondary | ICD-10-CM | POA: Diagnosis not present

## 2021-09-11 DIAGNOSIS — Z86010 Personal history of colonic polyps: Secondary | ICD-10-CM | POA: Diagnosis not present

## 2021-11-04 ENCOUNTER — Other Ambulatory Visit: Payer: Self-pay | Admitting: Internal Medicine

## 2021-11-04 DIAGNOSIS — K5730 Diverticulosis of large intestine without perforation or abscess without bleeding: Secondary | ICD-10-CM | POA: Diagnosis not present

## 2021-11-04 DIAGNOSIS — K640 First degree hemorrhoids: Secondary | ICD-10-CM | POA: Diagnosis not present

## 2021-11-04 DIAGNOSIS — Z86010 Personal history of colonic polyps: Secondary | ICD-10-CM | POA: Diagnosis not present

## 2021-11-04 DIAGNOSIS — D123 Benign neoplasm of transverse colon: Secondary | ICD-10-CM | POA: Diagnosis not present

## 2021-11-06 LAB — SURGICAL PATHOLOGY

## 2022-03-19 DIAGNOSIS — H26493 Other secondary cataract, bilateral: Secondary | ICD-10-CM | POA: Diagnosis not present

## 2022-06-05 IMAGING — DX DG KNEE 1-2V PORT*L*
2 series · 2 of 2 positions shown · non-contrast
Comparison: None.

CLINICAL DATA: 74-year-old male status post left knee replacement.

EXAM:
PORTABLE LEFT KNEE - 1-2 VIEW

[knee ap]
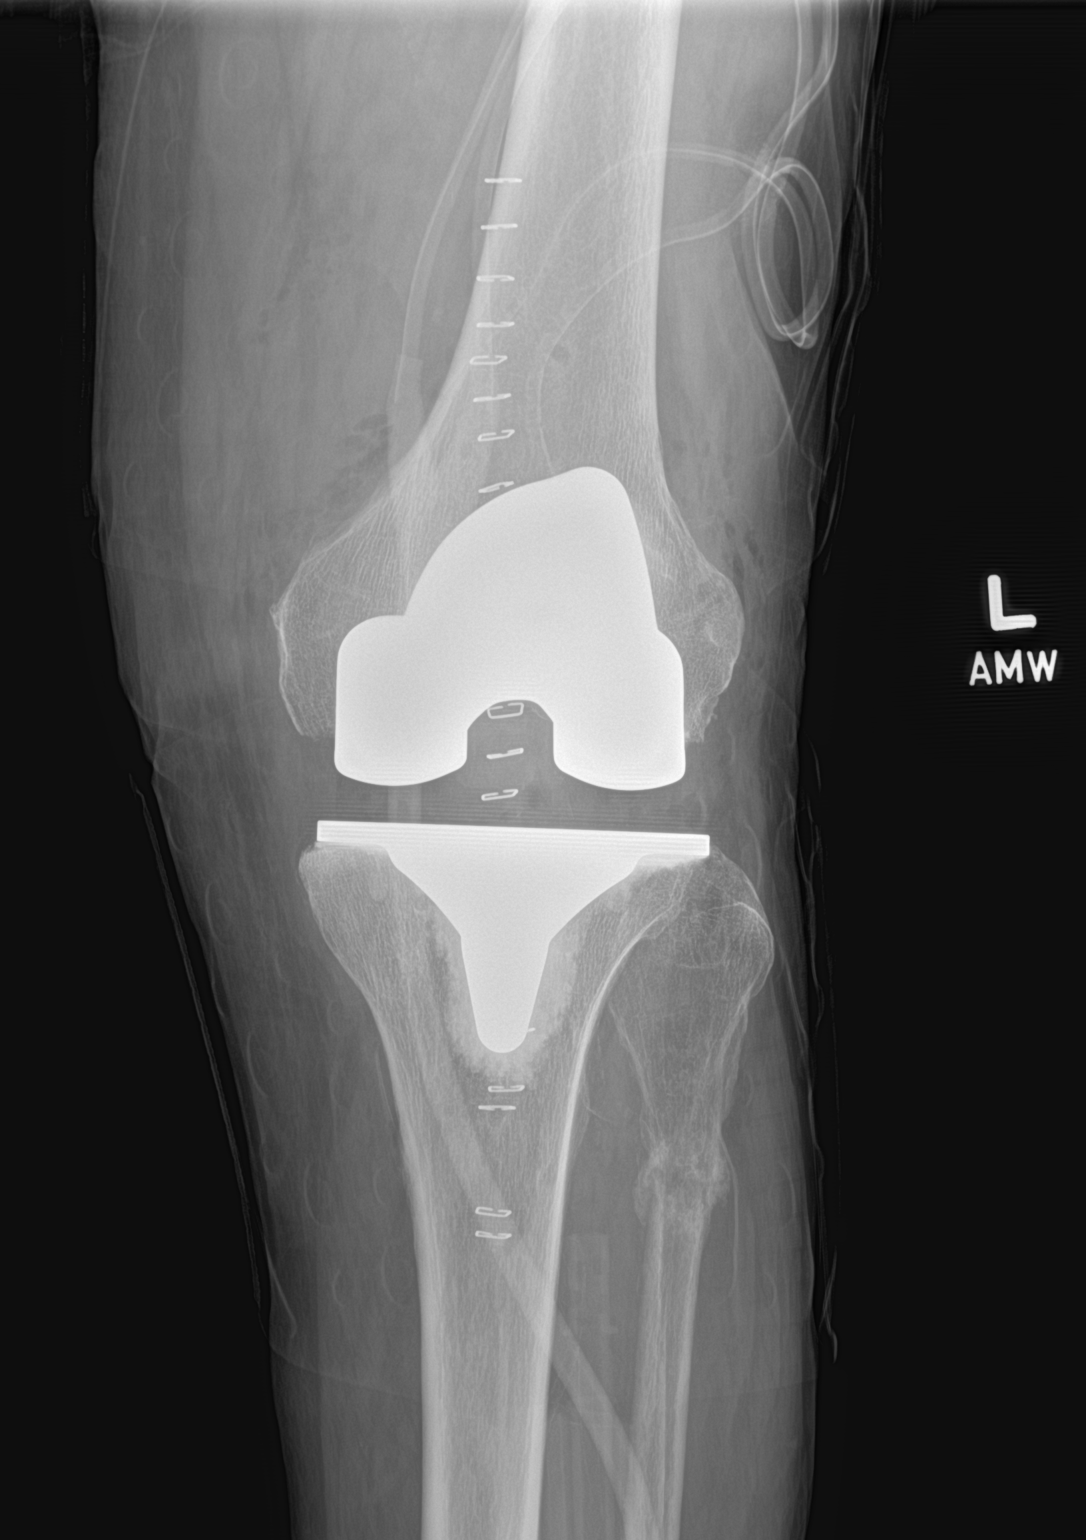

[knee lat]
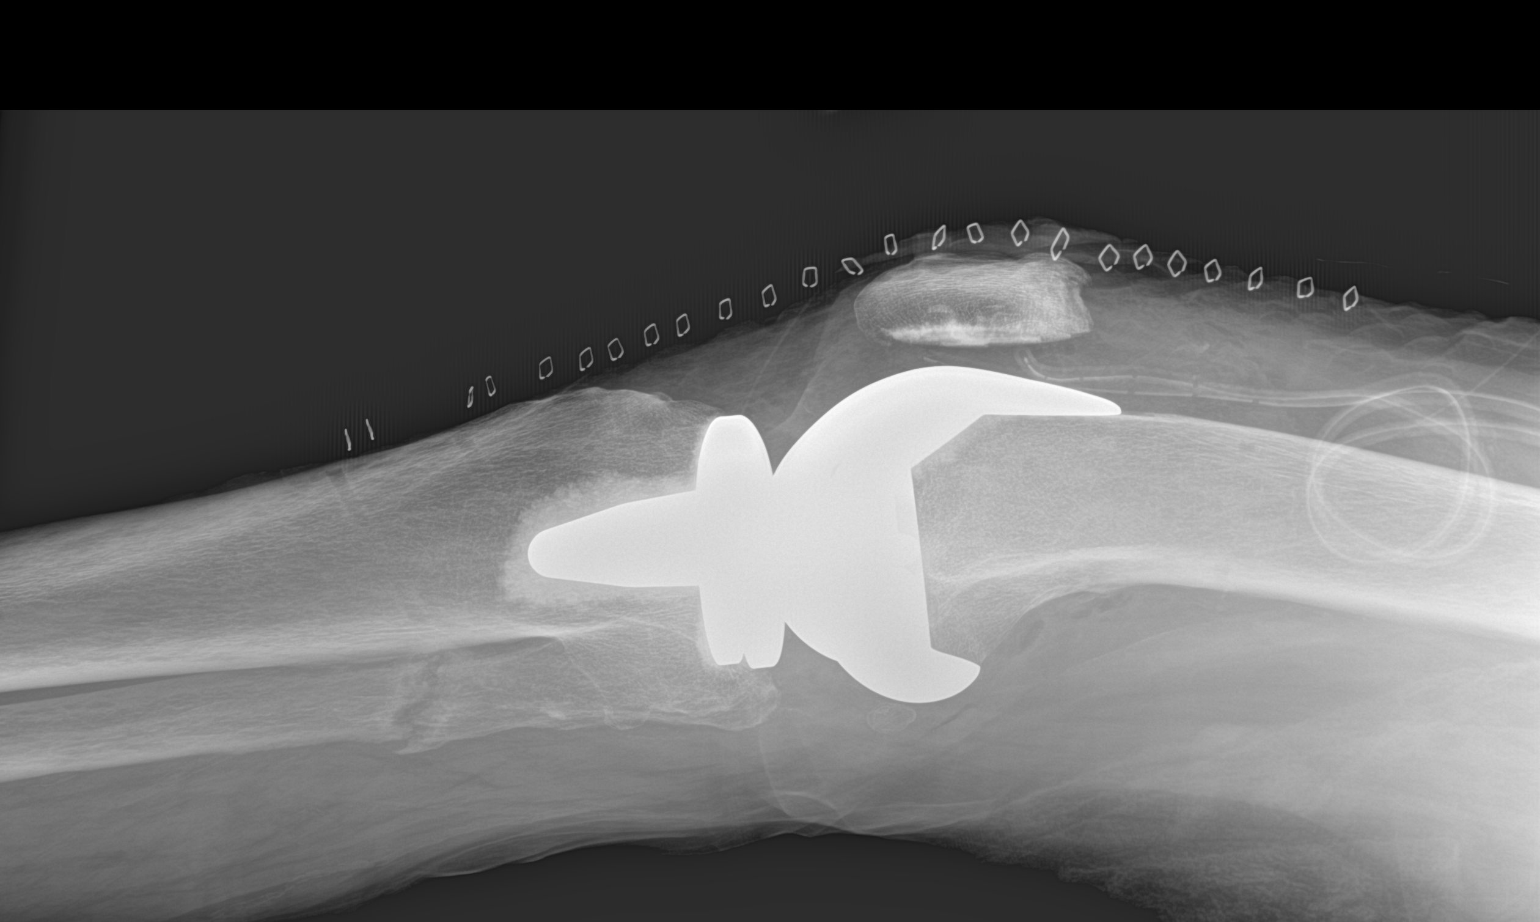

[2 of 2 positions shown; findings below may reference images not displayed]

FINDINGS: AP and cross-table lateral views at 0008 hours.

There is a subacute appearing but ununited fracture of the proximal
left fibula shaft, nondisplaced. Margins of the fracture indistinct
and underlying fibula bone mineralization is somewhat heterogeneous.

Superimposed left total knee arthroplasty hardware appears intact
and normally aligned. Postoperative drain in place. Expected osseous
changes to the patella. Anterior skin staples and mild regional soft
tissue gas.
IMPRESSION: 1. Subacute appearing and ununited fracture of the proximal left
fibula shaft without displacement. A pathologic fracture is not
excluded.
2. Superimposed left total knee arthroplasty with no adverse
features.

## 2022-06-10 DIAGNOSIS — Z96653 Presence of artificial knee joint, bilateral: Secondary | ICD-10-CM | POA: Diagnosis not present

## 2022-07-16 DIAGNOSIS — M65331 Trigger finger, right middle finger: Secondary | ICD-10-CM | POA: Diagnosis not present

## 2022-07-16 DIAGNOSIS — I10 Essential (primary) hypertension: Secondary | ICD-10-CM | POA: Diagnosis not present

## 2022-07-16 DIAGNOSIS — M79641 Pain in right hand: Secondary | ICD-10-CM | POA: Diagnosis not present

## 2022-07-16 DIAGNOSIS — M65332 Trigger finger, left middle finger: Secondary | ICD-10-CM | POA: Diagnosis not present

## 2022-07-16 DIAGNOSIS — M79642 Pain in left hand: Secondary | ICD-10-CM | POA: Diagnosis not present

## 2022-07-16 DIAGNOSIS — M109 Gout, unspecified: Secondary | ICD-10-CM | POA: Diagnosis not present

## 2022-07-17 ENCOUNTER — Ambulatory Visit: Admission: RE | Admit: 2022-07-17 | Payer: Medicare HMO | Source: Home / Self Care

## 2022-07-17 DIAGNOSIS — M79641 Pain in right hand: Secondary | ICD-10-CM | POA: Diagnosis not present

## 2022-07-17 DIAGNOSIS — M79642 Pain in left hand: Secondary | ICD-10-CM | POA: Diagnosis not present

## 2022-07-23 DIAGNOSIS — Z1159 Encounter for screening for other viral diseases: Secondary | ICD-10-CM | POA: Diagnosis not present

## 2022-07-23 DIAGNOSIS — M064 Inflammatory polyarthropathy: Secondary | ICD-10-CM | POA: Diagnosis not present

## 2022-07-23 DIAGNOSIS — Z7960 Long term (current) use of unspecified immunomodulators and immunosuppressants: Secondary | ICD-10-CM | POA: Diagnosis not present

## 2022-07-23 DIAGNOSIS — M19042 Primary osteoarthritis, left hand: Secondary | ICD-10-CM | POA: Diagnosis not present

## 2022-07-23 DIAGNOSIS — M19041 Primary osteoarthritis, right hand: Secondary | ICD-10-CM | POA: Diagnosis not present

## 2022-08-19 DIAGNOSIS — Z125 Encounter for screening for malignant neoplasm of prostate: Secondary | ICD-10-CM | POA: Diagnosis not present

## 2022-08-19 DIAGNOSIS — K219 Gastro-esophageal reflux disease without esophagitis: Secondary | ICD-10-CM | POA: Diagnosis not present

## 2022-08-19 DIAGNOSIS — Z0000 Encounter for general adult medical examination without abnormal findings: Secondary | ICD-10-CM | POA: Diagnosis not present

## 2022-08-19 DIAGNOSIS — I10 Essential (primary) hypertension: Secondary | ICD-10-CM | POA: Diagnosis not present

## 2022-08-19 DIAGNOSIS — D696 Thrombocytopenia, unspecified: Secondary | ICD-10-CM | POA: Diagnosis not present

## 2022-08-19 DIAGNOSIS — M064 Inflammatory polyarthropathy: Secondary | ICD-10-CM | POA: Diagnosis not present

## 2022-08-19 DIAGNOSIS — I25119 Atherosclerotic heart disease of native coronary artery with unspecified angina pectoris: Secondary | ICD-10-CM | POA: Diagnosis not present

## 2022-08-19 DIAGNOSIS — Z1331 Encounter for screening for depression: Secondary | ICD-10-CM | POA: Diagnosis not present

## 2022-08-19 DIAGNOSIS — E785 Hyperlipidemia, unspecified: Secondary | ICD-10-CM | POA: Diagnosis not present

## 2022-08-26 DIAGNOSIS — Z7960 Long term (current) use of unspecified immunomodulators and immunosuppressants: Secondary | ICD-10-CM | POA: Diagnosis not present

## 2022-08-26 DIAGNOSIS — M19041 Primary osteoarthritis, right hand: Secondary | ICD-10-CM | POA: Diagnosis not present

## 2022-08-26 DIAGNOSIS — M19042 Primary osteoarthritis, left hand: Secondary | ICD-10-CM | POA: Diagnosis not present

## 2022-08-26 DIAGNOSIS — M0609 Rheumatoid arthritis without rheumatoid factor, multiple sites: Secondary | ICD-10-CM | POA: Diagnosis not present

## 2022-08-29 DIAGNOSIS — M25511 Pain in right shoulder: Secondary | ICD-10-CM | POA: Diagnosis not present

## 2022-08-29 DIAGNOSIS — I25118 Atherosclerotic heart disease of native coronary artery with other forms of angina pectoris: Secondary | ICD-10-CM | POA: Diagnosis not present

## 2022-08-29 DIAGNOSIS — I10 Essential (primary) hypertension: Secondary | ICD-10-CM | POA: Diagnosis not present

## 2022-08-29 DIAGNOSIS — M542 Cervicalgia: Secondary | ICD-10-CM | POA: Diagnosis not present

## 2022-08-29 DIAGNOSIS — M62838 Other muscle spasm: Secondary | ICD-10-CM | POA: Diagnosis not present

## 2022-08-29 DIAGNOSIS — G8929 Other chronic pain: Secondary | ICD-10-CM | POA: Diagnosis not present

## 2022-08-29 DIAGNOSIS — M47812 Spondylosis without myelopathy or radiculopathy, cervical region: Secondary | ICD-10-CM | POA: Diagnosis not present

## 2022-08-29 DIAGNOSIS — M19011 Primary osteoarthritis, right shoulder: Secondary | ICD-10-CM | POA: Diagnosis not present

## 2022-08-29 DIAGNOSIS — M50322 Other cervical disc degeneration at C5-C6 level: Secondary | ICD-10-CM | POA: Diagnosis not present

## 2022-09-08 DIAGNOSIS — I25119 Atherosclerotic heart disease of native coronary artery with unspecified angina pectoris: Secondary | ICD-10-CM | POA: Diagnosis not present

## 2022-09-08 DIAGNOSIS — I10 Essential (primary) hypertension: Secondary | ICD-10-CM | POA: Diagnosis not present

## 2022-09-08 DIAGNOSIS — R079 Chest pain, unspecified: Secondary | ICD-10-CM | POA: Diagnosis not present

## 2022-09-29 DIAGNOSIS — R0789 Other chest pain: Secondary | ICD-10-CM | POA: Diagnosis not present

## 2022-09-29 DIAGNOSIS — I25119 Atherosclerotic heart disease of native coronary artery with unspecified angina pectoris: Secondary | ICD-10-CM | POA: Diagnosis not present

## 2022-10-12 IMAGING — DX DG KNEE 1-2V PORT*R*
2 series · 2 of 2 positions shown · non-contrast
Comparison: 02/08/2021

CLINICAL DATA: Total knee arthroplasty

EXAM:
PORTABLE RIGHT KNEE - 1-2 VIEW

[knee ap]
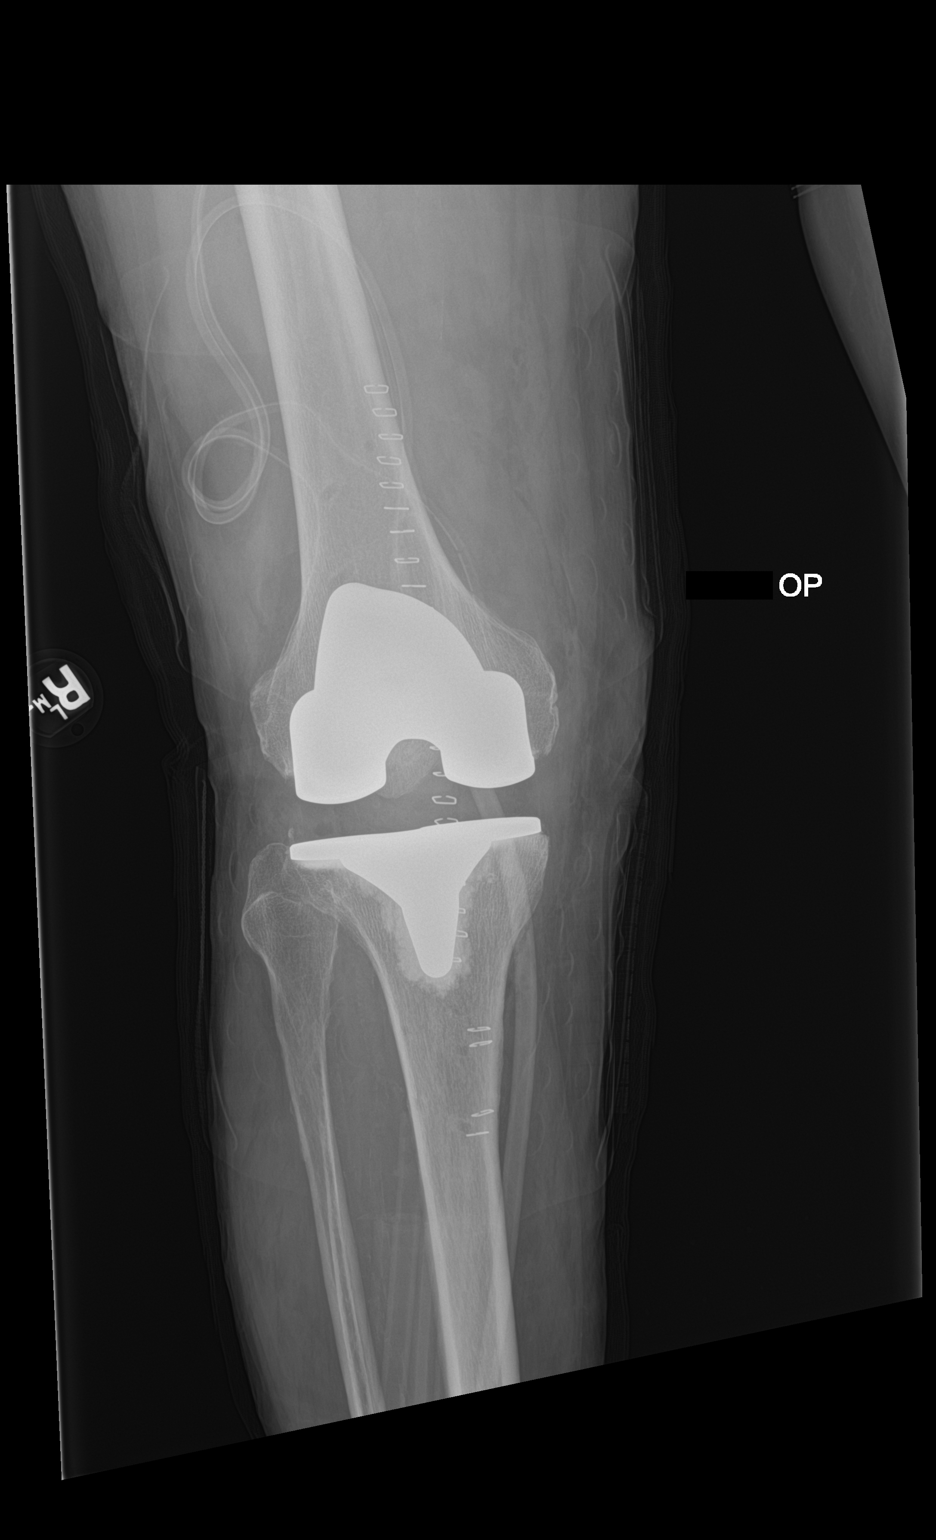

[knee lat]
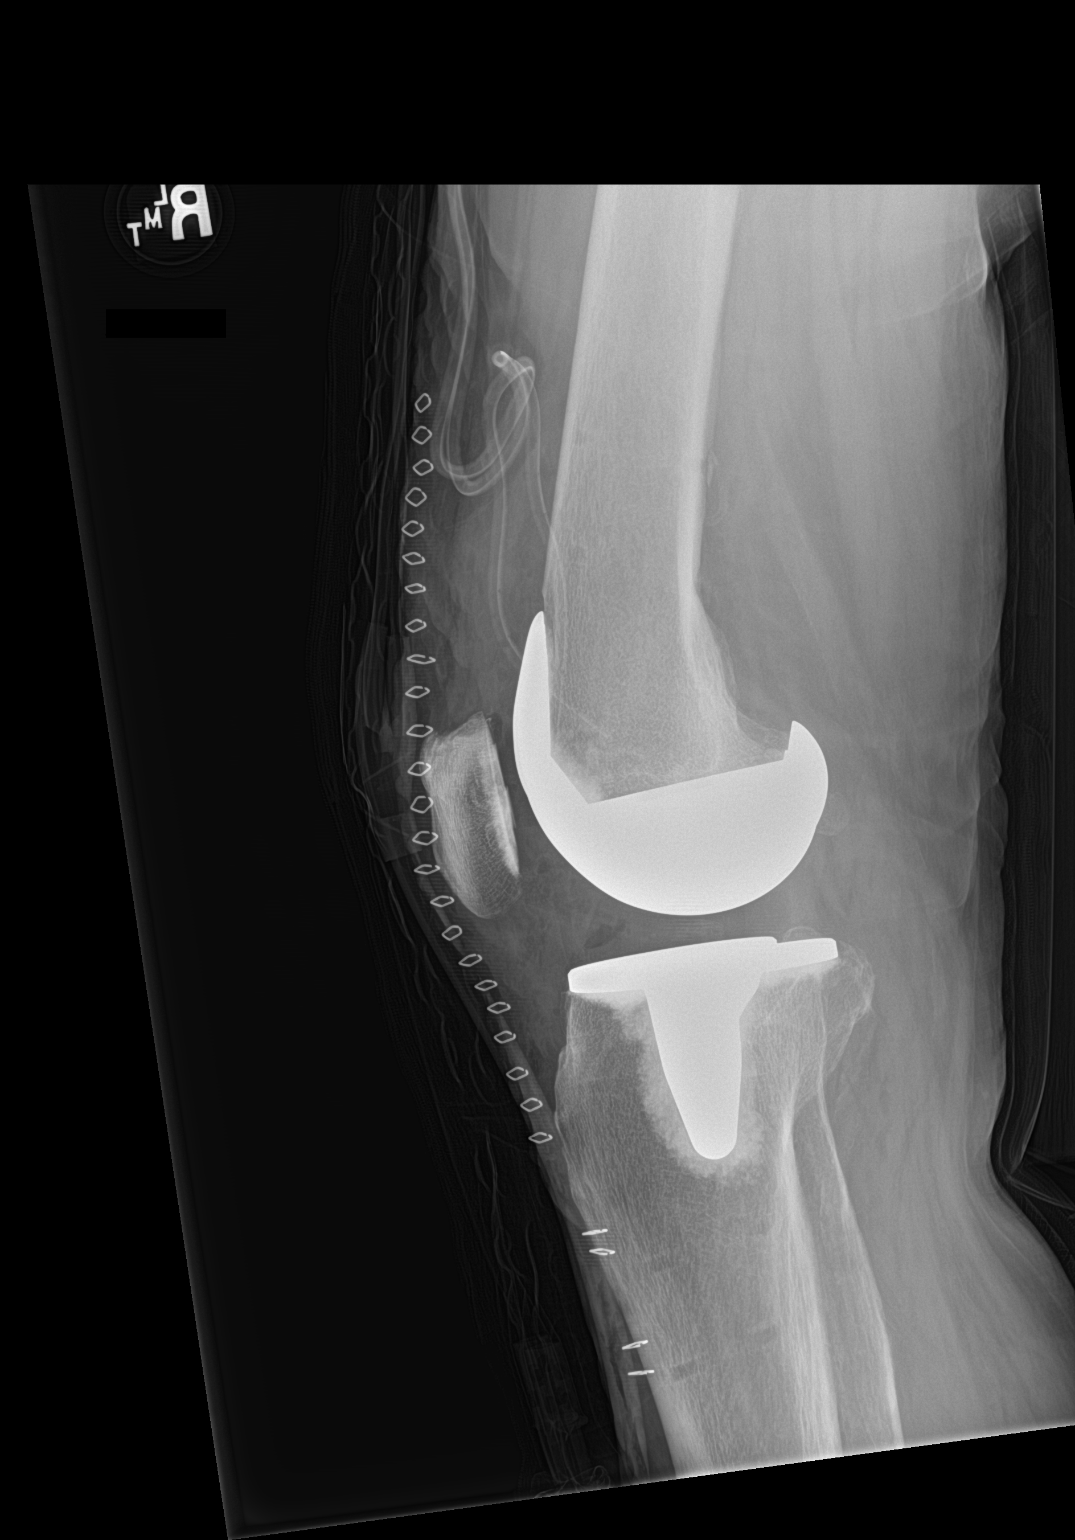

[2 of 2 positions shown; findings below may reference images not displayed]

FINDINGS: Total knee arthroplasty which is well seated. No periprosthetic
fracture or subluxation.
IMPRESSION: No complicating features of total knee arthroplasty.

## 2022-10-20 DIAGNOSIS — I25119 Atherosclerotic heart disease of native coronary artery with unspecified angina pectoris: Secondary | ICD-10-CM | POA: Diagnosis not present

## 2022-10-20 DIAGNOSIS — Z23 Encounter for immunization: Secondary | ICD-10-CM | POA: Diagnosis not present

## 2022-10-20 DIAGNOSIS — I10 Essential (primary) hypertension: Secondary | ICD-10-CM | POA: Diagnosis not present

## 2022-10-20 DIAGNOSIS — Z6831 Body mass index (BMI) 31.0-31.9, adult: Secondary | ICD-10-CM | POA: Diagnosis not present

## 2022-10-20 DIAGNOSIS — E6609 Other obesity due to excess calories: Secondary | ICD-10-CM | POA: Diagnosis not present

## 2022-10-29 DIAGNOSIS — Z7960 Long term (current) use of unspecified immunomodulators and immunosuppressants: Secondary | ICD-10-CM | POA: Diagnosis not present

## 2022-10-29 DIAGNOSIS — M19042 Primary osteoarthritis, left hand: Secondary | ICD-10-CM | POA: Diagnosis not present

## 2022-10-29 DIAGNOSIS — M25542 Pain in joints of left hand: Secondary | ICD-10-CM | POA: Diagnosis not present

## 2022-10-29 DIAGNOSIS — M19041 Primary osteoarthritis, right hand: Secondary | ICD-10-CM | POA: Diagnosis not present

## 2022-10-29 DIAGNOSIS — M0609 Rheumatoid arthritis without rheumatoid factor, multiple sites: Secondary | ICD-10-CM | POA: Diagnosis not present

## 2022-11-13 DIAGNOSIS — W010XXA Fall on same level from slipping, tripping and stumbling without subsequent striking against object, initial encounter: Secondary | ICD-10-CM | POA: Diagnosis not present

## 2022-11-13 DIAGNOSIS — M25432 Effusion, left wrist: Secondary | ICD-10-CM | POA: Diagnosis not present

## 2022-11-13 DIAGNOSIS — S52572A Other intraarticular fracture of lower end of left radius, initial encounter for closed fracture: Secondary | ICD-10-CM | POA: Diagnosis not present

## 2022-11-19 DIAGNOSIS — M25532 Pain in left wrist: Secondary | ICD-10-CM | POA: Diagnosis not present

## 2022-11-19 DIAGNOSIS — S52572D Other intraarticular fracture of lower end of left radius, subsequent encounter for closed fracture with routine healing: Secondary | ICD-10-CM | POA: Diagnosis not present

## 2022-11-19 DIAGNOSIS — M25432 Effusion, left wrist: Secondary | ICD-10-CM | POA: Diagnosis not present

## 2022-11-19 DIAGNOSIS — W010XXD Fall on same level from slipping, tripping and stumbling without subsequent striking against object, subsequent encounter: Secondary | ICD-10-CM | POA: Diagnosis not present

## 2022-11-24 DIAGNOSIS — C220 Liver cell carcinoma: Secondary | ICD-10-CM

## 2022-11-24 HISTORY — DX: Liver cell carcinoma: C22.0

## 2022-11-26 DIAGNOSIS — M25432 Effusion, left wrist: Secondary | ICD-10-CM | POA: Diagnosis not present

## 2022-11-26 DIAGNOSIS — M25532 Pain in left wrist: Secondary | ICD-10-CM | POA: Diagnosis not present

## 2022-11-26 DIAGNOSIS — S52572D Other intraarticular fracture of lower end of left radius, subsequent encounter for closed fracture with routine healing: Secondary | ICD-10-CM | POA: Diagnosis not present

## 2022-11-26 DIAGNOSIS — W010XXD Fall on same level from slipping, tripping and stumbling without subsequent striking against object, subsequent encounter: Secondary | ICD-10-CM | POA: Diagnosis not present

## 2022-11-27 DIAGNOSIS — R748 Abnormal levels of other serum enzymes: Secondary | ICD-10-CM | POA: Diagnosis not present

## 2022-12-03 DIAGNOSIS — R1031 Right lower quadrant pain: Secondary | ICD-10-CM | POA: Diagnosis not present

## 2022-12-03 DIAGNOSIS — M25432 Effusion, left wrist: Secondary | ICD-10-CM | POA: Diagnosis not present

## 2022-12-03 DIAGNOSIS — W010XXD Fall on same level from slipping, tripping and stumbling without subsequent striking against object, subsequent encounter: Secondary | ICD-10-CM | POA: Diagnosis not present

## 2022-12-03 DIAGNOSIS — S52572D Other intraarticular fracture of lower end of left radius, subsequent encounter for closed fracture with routine healing: Secondary | ICD-10-CM | POA: Diagnosis not present

## 2022-12-03 DIAGNOSIS — M25532 Pain in left wrist: Secondary | ICD-10-CM | POA: Diagnosis not present

## 2022-12-24 DIAGNOSIS — S52572D Other intraarticular fracture of lower end of left radius, subsequent encounter for closed fracture with routine healing: Secondary | ICD-10-CM | POA: Diagnosis not present

## 2022-12-24 DIAGNOSIS — W19XXXD Unspecified fall, subsequent encounter: Secondary | ICD-10-CM | POA: Diagnosis not present

## 2022-12-24 DIAGNOSIS — M25432 Effusion, left wrist: Secondary | ICD-10-CM | POA: Diagnosis not present

## 2022-12-24 DIAGNOSIS — M25532 Pain in left wrist: Secondary | ICD-10-CM | POA: Diagnosis not present

## 2023-01-22 DIAGNOSIS — W010XXD Fall on same level from slipping, tripping and stumbling without subsequent striking against object, subsequent encounter: Secondary | ICD-10-CM | POA: Diagnosis not present

## 2023-01-22 DIAGNOSIS — S52572D Other intraarticular fracture of lower end of left radius, subsequent encounter for closed fracture with routine healing: Secondary | ICD-10-CM | POA: Diagnosis not present

## 2023-01-22 DIAGNOSIS — M25532 Pain in left wrist: Secondary | ICD-10-CM | POA: Diagnosis not present

## 2023-01-22 DIAGNOSIS — M25432 Effusion, left wrist: Secondary | ICD-10-CM | POA: Diagnosis not present

## 2023-04-09 ENCOUNTER — Ambulatory Visit: Payer: Medicare HMO | Admitting: Nurse Practitioner

## 2023-04-23 ENCOUNTER — Ambulatory Visit: Payer: Medicare HMO | Admitting: Physician Assistant

## 2023-04-27 DIAGNOSIS — I10 Essential (primary) hypertension: Secondary | ICD-10-CM | POA: Diagnosis not present

## 2023-04-27 DIAGNOSIS — I25119 Atherosclerotic heart disease of native coronary artery with unspecified angina pectoris: Secondary | ICD-10-CM | POA: Diagnosis not present

## 2023-04-29 ENCOUNTER — Ambulatory Visit: Payer: Medicare HMO | Admitting: Nurse Practitioner

## 2023-05-04 DIAGNOSIS — R350 Frequency of micturition: Secondary | ICD-10-CM | POA: Diagnosis not present

## 2023-05-04 DIAGNOSIS — N410 Acute prostatitis: Secondary | ICD-10-CM | POA: Diagnosis not present

## 2023-05-21 DIAGNOSIS — H26493 Other secondary cataract, bilateral: Secondary | ICD-10-CM | POA: Diagnosis not present

## 2023-06-25 ENCOUNTER — Encounter: Payer: Self-pay | Admitting: Urology

## 2023-06-25 ENCOUNTER — Ambulatory Visit: Payer: Medicare HMO | Admitting: Urology

## 2023-06-25 VITALS — BP 153/78 | HR 51 | Ht 67.0 in | Wt 200.0 lb

## 2023-06-25 DIAGNOSIS — R350 Frequency of micturition: Secondary | ICD-10-CM

## 2023-06-25 DIAGNOSIS — R399 Unspecified symptoms and signs involving the genitourinary system: Secondary | ICD-10-CM

## 2023-06-25 DIAGNOSIS — R102 Pelvic and perineal pain: Secondary | ICD-10-CM

## 2023-06-25 DIAGNOSIS — N5082 Scrotal pain: Secondary | ICD-10-CM | POA: Diagnosis not present

## 2023-06-25 LAB — URINALYSIS, COMPLETE
Bilirubin, UA: NEGATIVE
Glucose, UA: NEGATIVE
Ketones, UA: NEGATIVE
Leukocytes,UA: NEGATIVE
Nitrite, UA: NEGATIVE
Protein,UA: NEGATIVE
RBC, UA: NEGATIVE
Specific Gravity, UA: 1.02 (ref 1.005–1.030)
Urobilinogen, Ur: 4 mg/dL — ABNORMAL HIGH (ref 0.2–1.0)
pH, UA: 6 (ref 5.0–7.5)

## 2023-06-25 LAB — MICROSCOPIC EXAMINATION

## 2023-06-25 MED ORDER — TAMSULOSIN HCL 0.4 MG PO CAPS: 0.4000 mg | ORAL_CAPSULE | Freq: Every day | ORAL | 3 refills | Status: AC

## 2023-06-25 NOTE — Progress Notes (Signed)
I,Dina M Abdulla,acting as a scribe for Riki Altes, MD.,have documented all relevant documentation on the behalf of Riki Altes, MD,as directed by  Riki Altes, MD while in the presence of Riki Altes, MD.  06/25/2023 1:57 PM   Luke Wall 1946/03/24 [id number]  Referring provider: [name], MD [address] [facility] [place],  [place] [address]  Chief Complaint  Patient presents with   New Patient (Initial Visit)   Urinary Frequency    HPI: Luke Wall is a 77 y.o. male referred for evaluation of lower urinary tract symptoms.  Initially saw Dr. Burnett Sheng 05/04/2023 complaining of urinary frequency, urgency, and nocturia q60 minutes. Urinalysis was unremarkable except for elevated specific gravity and he was encouraged to increase his water intake. There was no evidence of infection, however, he was treated empirically with Cipro 500 mg x14 days, which was extended another 14 days. He states his nocturia has improved but still has some frequency and urgency. IPSS today is 3/35. He has been complaining of right lower pelvic pain and right scrotal pain. Has chronic back pain, but no history of a degenerative disc disease. Prior history of nephrolithiasis.   PMH: Past Medical History:  Diagnosis Date   Coronary artery disease    GERD (gastroesophageal reflux disease)    History of kidney stones    Hypertension    Nuclear sclerotic cataract of right eye 01/31/2016   Osteoarthritis of left knee 09/16/2020    Surgical History: Past Surgical History:  Procedure Laterality Date   CARDIAC CATHETERIZATION  1990's   POBA   CATARACT EXTRACTION W/ INTRAOCULAR LENS  IMPLANT, BILATERAL Bilateral 2019   COLONOSCOPY  2018   KNEE ARTHROPLASTY Left 02/08/2021   Procedure: COMPUTER ASSISTED TOTAL KNEE ARTHROPLASTY - RNFA;  Surgeon: Donato Heinz, MD;  Location: [facility] ORS;  Service: Orthopedics;  Laterality: Left;   KNEE ARTHROPLASTY Right 06/17/2021    Procedure: COMPUTER ASSISTED TOTAL KNEE ARTHROPLASTY;  Surgeon: Donato Heinz, MD;  Location: [facility] ORS;  Service: Orthopedics;  Laterality: Right;   KNEE ARTHROSCOPY Left 1976   TONSILLECTOMY  1960    Home Medications:  Allergies as of 06/25/2023       Reactions   Tramadol-acetaminophen    Other Reaction(s): GI Intolerance        Medication List        Accurate as of June 25, 2023  1:57 PM. If you have any questions, ask your nurse or doctor.          STOP taking these medications    enoxaparin 40 MG/0.4ML injection Commonly known as: LOVENOX Stopped by: Riki Altes   oxyCODONE 5 MG immediate release tablet Commonly known as: Oxy IR/ROXICODONE Stopped by: Riki Altes   rosuvastatin 10 MG tablet Commonly known as: CRESTOR Stopped by: Riki Altes       TAKE these medications    acetaminophen 500 MG tablet Commonly known as: TYLENOL Take 1,000 mg by mouth every 6 (six) hours as needed for moderate pain.   atenolol 50 MG tablet Commonly known as: TENORMIN Take 50 mg by mouth daily.   celecoxib 200 MG capsule Commonly known as: CELEBREX Take 1 capsule (200 mg total) by mouth 2 (two) times daily. What changed: Another medication with the same name was removed. Continue taking this medication, and follow the directions you see here. Changed by: Riki Altes   diclofenac Sodium 1 % Gel Commonly known as: VOLTAREN  Apply 2 g topically daily as needed (knee pain).   Melatonin 10 MG Tabs Take 1 tablet by mouth at bedtime as needed.   omeprazole 20 MG capsule Commonly known as: PRILOSEC Take 20 mg by mouth daily.   tamsulosin 0.4 MG Caps capsule Commonly known as: FLOMAX Take 1 capsule (0.4 mg total) by mouth daily. Started by: Riki Altes        Allergies:  Allergies  Allergen Reactions   Tramadol-Acetaminophen     Other Reaction(s): GI Intolerance     Social History:  reports that he has quit smoking. His smoking use  included cigarettes. He has been exposed to tobacco smoke. He has never used smokeless tobacco. He reports that he does not currently use alcohol. He reports that he does not use drugs.   Physical Exam: BP (!) 153/78   Pulse (!) 51   Ht 5\' 7"  (1.702 m)   Wt 200 lb (90.7 kg)   BMI 31.32 kg/m   Constitutional:  Alert and oriented, No acute distress. HEENT: [place] AT Respiratory: Normal respiratory effort, no increased work of breathing. GU: Prostate 40 g smooth without nodules. Psychiatric: Normal mood and affect.    Assessment & Plan:    1. Lower urinary tract symptoms Symptoms have improved over the last month. Urinalysis today showed no abnormalities Start Tamsulosin 0.4 mg daily.  2. Pelvic pain Schedule CT abdomen/pelvis without contrast with prior history of stones.  3. Scrotal pain CT as above.  I have reviewed the above documentation for accuracy and completeness, and I agree with the above.   Riki Altes, MD  [facility] [address] [place], [place] [address] [phone]

## 2023-06-27 ENCOUNTER — Encounter: Payer: Self-pay | Admitting: Urology

## 2023-06-30 ENCOUNTER — Ambulatory Visit: Payer: Medicare HMO

## 2023-07-10 ENCOUNTER — Ambulatory Visit
Admission: RE | Admit: 2023-07-10 | Discharge: 2023-07-10 | Disposition: A | Discharge status: Home / Self Care | Payer: Medicare HMO | Source: Ambulatory Visit | Attending: Urology | Admitting: Urology

## 2023-07-10 DIAGNOSIS — N134 Hydroureter: Secondary | ICD-10-CM | POA: Diagnosis not present

## 2023-07-10 DIAGNOSIS — K4090 Unilateral inguinal hernia, without obstruction or gangrene, not specified as recurrent: Secondary | ICD-10-CM | POA: Diagnosis not present

## 2023-07-10 DIAGNOSIS — R100 Acute abdomen: Secondary | ICD-10-CM | POA: Insufficient documentation

## 2023-07-10 DIAGNOSIS — N132 Hydronephrosis with renal and ureteral calculous obstruction: Secondary | ICD-10-CM | POA: Diagnosis not present

## 2023-07-10 DIAGNOSIS — K5730 Diverticulosis of large intestine without perforation or abscess without bleeding: Secondary | ICD-10-CM | POA: Diagnosis not present

## 2023-07-10 DIAGNOSIS — R350 Frequency of micturition: Secondary | ICD-10-CM | POA: Diagnosis not present

## 2023-07-23 ENCOUNTER — Ambulatory Visit: Payer: Medicare HMO

## 2023-07-29 ENCOUNTER — Ambulatory Visit: Admission: RE | Admit: 2023-07-29 | Payer: Medicare HMO | Source: Ambulatory Visit

## 2023-07-29 ENCOUNTER — Ambulatory Visit: Payer: Medicare HMO | Admitting: Urology

## 2023-07-29 ENCOUNTER — Encounter: Payer: Self-pay | Admitting: Urology

## 2023-07-29 ENCOUNTER — Ambulatory Visit
Admission: RE | Admit: 2023-07-29 | Discharge: 2023-07-29 | Disposition: A | Discharge status: Home / Self Care | Payer: Medicare HMO | Attending: Urology | Admitting: Urology

## 2023-07-29 VITALS — BP 130/80 | HR 74 | Ht 70.0 in | Wt 200.0 lb

## 2023-07-29 DIAGNOSIS — N202 Calculus of kidney with calculus of ureter: Secondary | ICD-10-CM | POA: Diagnosis not present

## 2023-07-29 DIAGNOSIS — N200 Calculus of kidney: Secondary | ICD-10-CM | POA: Diagnosis not present

## 2023-07-29 DIAGNOSIS — Z01818 Encounter for other preprocedural examination: Secondary | ICD-10-CM | POA: Diagnosis not present

## 2023-07-29 DIAGNOSIS — N201 Calculus of ureter: Secondary | ICD-10-CM

## 2023-07-29 NOTE — H&P (View-Only) (Signed)
Luke Wall as a scribe for Riki Altes, MD.,have documented all relevant documentation on the behalf of Riki Altes, MD,as directed by  Riki Altes, MD while in the presence of Riki Altes, MD.  07/29/2023 9:36 PM   Luke Wall [birth year] [id number]  Referring provider: [name], MD [address] [facility] [place],  [place] [address]  Chief Complaint  Patient presents with   Results    HPI: Luke Wall is a 77 y.o. male presenting for 1 month follow up of lower urinary tract symptoms.  Initially seen 06/25/23 with complaints of urinary frequency. urgency, and nocturia Q60 minutes. He was also complaining of right pelvic and scrotal pain. CT scan was ordered and performed 07/10/23. There were bilateral renal calculi with the largest stone measuring 4 mm on the right. There was a mild hydroprosis and proximal ureteral calculus. There was also an 8 mm non-obstructing stone at the left UVJ. Since his visit, he continues to have intermittent right abdominal/pelvic and scrotal pain, sometimes worse with movement. His frequency and urgency have continued.   PMH: Past Medical History:  Diagnosis Date   Coronary artery disease    GERD (gastroesophageal reflux disease)    History of kidney stones    Hypertension    Nuclear sclerotic cataract of right eye 01/31/2016   Osteoarthritis of left knee 09/16/2020    Surgical History: Past Surgical History:  Procedure Laterality Date   CARDIAC CATHETERIZATION  1990's   POBA   CATARACT EXTRACTION W/ INTRAOCULAR LENS  IMPLANT, BILATERAL Bilateral 2019   COLONOSCOPY  2018   KNEE ARTHROPLASTY Left 02/08/2021   Procedure: COMPUTER ASSISTED TOTAL KNEE ARTHROPLASTY - RNFA;  Surgeon: Donato Heinz, MD;  Location: [facility] ORS;  Service: Orthopedics;  Laterality: Left;   KNEE ARTHROPLASTY Right 06/17/2021   Procedure: COMPUTER ASSISTED TOTAL KNEE ARTHROPLASTY;  Surgeon: Donato Heinz, MD;  Location: [facility]  ORS;  Service: Orthopedics;  Laterality: Right;   KNEE ARTHROSCOPY Left 1976   TONSILLECTOMY  1960    Home Medications:  Allergies as of 07/29/2023       Reactions   Tramadol-acetaminophen    Other Reaction(s): GI Intolerance        Medication List        Accurate as of July 29, 2023  9:36 PM. If you have any questions, ask your nurse or doctor.          STOP taking these medications    Melatonin 10 MG Tabs Stopped by: Riki Altes       TAKE these medications    acetaminophen 500 MG tablet Commonly known as: TYLENOL Take 1,000 mg by mouth every 6 (six) hours as needed for moderate pain.   albuterol 108 (90 Base) MCG/ACT inhaler Commonly known as: VENTOLIN HFA INHALE TWO PUFFS BY MOUTH EVERY 4 TO 6 HOURS AS NEEDED FOR WHEEZING   atenolol 50 MG tablet Commonly known as: TENORMIN Take 50 mg by mouth daily.   celecoxib 200 MG capsule Commonly known as: CELEBREX Take 1 capsule (200 mg total) by mouth 2 (two) times daily.   diclofenac Sodium 1 % Gel Commonly known as: VOLTAREN Apply 2 g topically daily as needed (knee pain).   omeprazole 20 MG capsule Commonly known as: PRILOSEC Take 20 mg by mouth daily.   rosuvastatin 10 MG tablet Commonly known as: CRESTOR Take 1 tablet by mouth daily.        Allergies:  Allergies  Allergen Reactions   Tramadol-Acetaminophen     Other Reaction(s): GI Intolerance     Social History:  reports that he has quit smoking. His smoking use included cigarettes. He has been exposed to tobacco smoke. He has never used smokeless tobacco. He reports that he does not currently use alcohol. He reports that he does not use drugs.   Physical Exam: BP 130/80   Pulse 74   Ht 5\' 10"  (1.778 m)   Wt 200 lb (90.7 kg)   BMI 28.70 kg/m   Constitutional:  Alert and oriented, No acute distress. HEENT: [place] AT Respiratory: Normal respiratory effort, no increased work of breathing. Psychiatric: Normal mood and  affect.    Assessment & Plan:    1. Bilateral ureteral calculi Mild hydrotrophosis of a right proximal ureteral calculus, which could be accounting for his right sided pain. The left distal ureteral calculus is non-obstructing, though it could be accounting for his lower urinary tract symptoms. Recommended bilateral ureteroscopy with laser lithotripsy/stone removal and stent placement. The procedure was discussed, including potential risks of bleeding, infection, and ureteral injury. The need for a postoperative ureteral stent and the possibility of stent related symptoms, including voiding symptoms and pain was reviewed. He inquired if he would possibly be able to pass the stones. He was informed it is unlikely he will pass the left distal ureteral calculus, however, could potentially pass the right ureteral calculus. A KUB was ordered and he will be notified with the results. When he is contacted regarding the KUB results, he will decide on whether he desires to proceed with surgery. Was stressed he should call our office or proceed to the ED for decreased urine output or bilateral flank/abdominal pain.   I have reviewed the above documentation for accuracy and completeness, and I agree with the above.   Riki Altes, MD  [facility] [address] [place], [place] [address] [phone]

## 2023-07-29 NOTE — Progress Notes (Signed)
Luke Wall,acting as a scribe for Riki Altes, MD.,have documented all relevant documentation on the behalf of Riki Altes, MD,as directed by  Riki Altes, MD while in the presence of Riki Altes, MD.  07/29/2023 9:36 PM   Luke Wall [birth year] [id number]  Referring provider: [name], MD [address] [facility] [place],  [place] [address]  Chief Complaint  Patient presents with   Results    HPI: Luke Wall is a 77 y.o. male presenting for 1 month follow up of lower urinary tract symptoms.  Initially seen 06/25/23 with complaints of urinary frequency. urgency, and nocturia Q60 minutes. He was also complaining of right pelvic and scrotal pain. CT scan was ordered and performed 07/10/23. There were bilateral renal calculi with the largest stone measuring 4 mm on the right. There was a mild hydroprosis and proximal ureteral calculus. There was also an 8 mm non-obstructing stone at the left UVJ. Since his visit, he continues to have intermittent right abdominal/pelvic and scrotal pain, sometimes worse with movement. His frequency and urgency have continued.   PMH: Past Medical History:  Diagnosis Date   Coronary artery disease    GERD (gastroesophageal reflux disease)    History of kidney stones    Hypertension    Nuclear sclerotic cataract of right eye 01/31/2016   Osteoarthritis of left knee 09/16/2020    Surgical History: Past Surgical History:  Procedure Laterality Date   CARDIAC CATHETERIZATION  1990's   POBA   CATARACT EXTRACTION W/ INTRAOCULAR LENS  IMPLANT, BILATERAL Bilateral 2019   COLONOSCOPY  2018   KNEE ARTHROPLASTY Left 02/08/2021   Procedure: COMPUTER ASSISTED TOTAL KNEE ARTHROPLASTY - RNFA;  Surgeon: Donato Heinz, MD;  Location: [facility] ORS;  Service: Orthopedics;  Laterality: Left;   KNEE ARTHROPLASTY Right 06/17/2021   Procedure: COMPUTER ASSISTED TOTAL KNEE ARTHROPLASTY;  Surgeon: Donato Heinz, MD;  Location: [facility]  ORS;  Service: Orthopedics;  Laterality: Right;   KNEE ARTHROSCOPY Left 1976   TONSILLECTOMY  1960    Home Medications:  Allergies as of 07/29/2023       Reactions   Tramadol-acetaminophen    Other Reaction(s): GI Intolerance        Medication List        Accurate as of July 29, 2023  9:36 PM. If you have any questions, ask your nurse or doctor.          STOP taking these medications    Melatonin 10 MG Tabs Stopped by: Riki Altes       TAKE these medications    acetaminophen 500 MG tablet Commonly known as: TYLENOL Take 1,000 mg by mouth every 6 (six) hours as needed for moderate pain.   albuterol 108 (90 Base) MCG/ACT inhaler Commonly known as: VENTOLIN HFA INHALE TWO PUFFS BY MOUTH EVERY 4 TO 6 HOURS AS NEEDED FOR WHEEZING   atenolol 50 MG tablet Commonly known as: TENORMIN Take 50 mg by mouth daily.   celecoxib 200 MG capsule Commonly known as: CELEBREX Take 1 capsule (200 mg total) by mouth 2 (two) times daily.   diclofenac Sodium 1 % Gel Commonly known as: VOLTAREN Apply 2 g topically daily as needed (knee pain).   omeprazole 20 MG capsule Commonly known as: PRILOSEC Take 20 mg by mouth daily.   rosuvastatin 10 MG tablet Commonly known as: CRESTOR Take 1 tablet by mouth daily.        Allergies:  Allergies  Allergen Reactions   Tramadol-Acetaminophen     Other Reaction(s): GI Intolerance     Social History:  reports that he has quit smoking. His smoking use included cigarettes. He has been exposed to tobacco smoke. He has never used smokeless tobacco. He reports that he does not currently use alcohol. He reports that he does not use drugs.   Physical Exam: BP 130/80   Pulse 74   Ht 5\' 10"  (1.778 m)   Wt 200 lb (90.7 kg)   BMI 28.70 kg/m   Constitutional:  Alert and oriented, No acute distress. HEENT: [place] AT Respiratory: Normal respiratory effort, no increased work of breathing. Psychiatric: Normal mood and  affect.    Assessment & Plan:    1. Bilateral ureteral calculi Mild hydrotrophosis of a right proximal ureteral calculus, which could be accounting for his right sided pain. The left distal ureteral calculus is non-obstructing, though it could be accounting for his lower urinary tract symptoms. Recommended bilateral ureteroscopy with laser lithotripsy/stone removal and stent placement. The procedure was discussed, including potential risks of bleeding, infection, and ureteral injury. The need for a postoperative ureteral stent and the possibility of stent related symptoms, including voiding symptoms and pain was reviewed. He inquired if he would possibly be able to pass the stones. He was informed it is unlikely he will pass the left distal ureteral calculus, however, could potentially pass the right ureteral calculus. A KUB was ordered and he will be notified with the results. When he is contacted regarding the KUB results, he will decide on whether he desires to proceed with surgery. Was stressed he should call our office or proceed to the ED for decreased urine output or bilateral flank/abdominal pain.   I have reviewed the above documentation for accuracy and completeness, and I agree with the above.   Riki Altes, MD  [facility] [address] [place], [place] [address] [phone]

## 2023-07-30 ENCOUNTER — Telehealth: Payer: Self-pay

## 2023-07-30 ENCOUNTER — Telehealth: Payer: Self-pay | Admitting: Urology

## 2023-07-30 ENCOUNTER — Other Ambulatory Visit
Admission: RE | Admit: 2023-07-30 | Discharge: 2023-07-30 | Disposition: A | Discharge status: Home / Self Care | Payer: Medicare HMO | Attending: Urology | Admitting: Urology

## 2023-07-30 ENCOUNTER — Other Ambulatory Visit: Payer: Self-pay

## 2023-07-30 ENCOUNTER — Encounter: Payer: Self-pay | Admitting: Urology

## 2023-07-30 DIAGNOSIS — N201 Calculus of ureter: Secondary | ICD-10-CM | POA: Diagnosis not present

## 2023-07-30 LAB — URINALYSIS, COMPLETE (UACMP) WITH MICROSCOPIC
Bilirubin Urine: NEGATIVE
Glucose, UA: NEGATIVE mg/dL
Ketones, ur: NEGATIVE mg/dL
Leukocytes,Ua: NEGATIVE
Nitrite: NEGATIVE
Protein, ur: NEGATIVE mg/dL
Specific Gravity, Urine: 1.019 (ref 1.005–1.030)
Squamous Epithelial / HPF: NONE SEEN /HPF (ref 0–5)
pH: 5 (ref 5.0–8.0)

## 2023-07-30 NOTE — Progress Notes (Signed)
   [facility] Surgical Posting Form  Surgery Date: Date: 08/04/2023  Surgeon: Dr. Irineo Axon, MD  Inpt ( No  )   Outpt (Yes)   Obs ( No  )   Diagnosis: N20.1 Bilateral Ureteral Stone  -CPT: [phone]  Surgery: Bilateral Ureteroscopy with Laser Lithotripsy and Stent Placement  Stop Anticoagulations: N/A  Cardiac/Medical/Pulmonary Clearance needed: no  *Orders entered into EPIC  Date: 07/30/23   *Case booked in [place]  Date: 07/30/23  *Notified pt of Surgery: Date: 07/30/23  PRE-OP UA & CX: yes, will obtain at [facility] today  *Placed into Prior Authorization Work Angela Nevin Date: 07/30/23  Assistant/laser/rep:No

## 2023-07-30 NOTE — Progress Notes (Signed)
Surgical Physician Order Form [facility] [place]  Dr. Irineo Axon, MD  * Scheduling expectation : ASAP; 9/17 at the latest  *Length of Case: 90 minutes  *Clearance needed: no  *Anticoagulation Instructions: N/A  *Aspirin Instructions: N/A  *Post-op visit Date/Instructions:  1 week cysto stent removal  *Diagnosis: Bilateral ureteral calculi  *Procedure: bilateral Ureteroscopy w/laser lithotripsy & stent placement ([address])   Additional orders: N/A  -Admit type: OUTpatient  -Anesthesia: Choice  -VTE Prophylaxis Standing Order SCD's       Other:   -Standing Lab Orders Per Anesthesia    Lab other: UA&Urine Culture-Hospital lab  -Standing Test orders EKG/Chest x-ray per Anesthesia       Test other:   - Medications:  Ancef 2gm IV  -Other orders:  N/A

## 2023-07-30 NOTE — Telephone Encounter (Signed)
Spoke with Luke Wall. Luke Wall. Aware and surgery scheduled for 08/04/2023

## 2023-07-30 NOTE — Telephone Encounter (Signed)
Please notify patient KUB is reviewed and his right ureteral stone has not moved.  Recommend scheduling bilateral ureteroscopy/stone removal as discussed.  Orders were sent to [facility] and she will be contacting the patient

## 2023-07-30 NOTE — Telephone Encounter (Signed)
Per Dr. Lonna Cobb, Patient is to be scheduled for Bilateral Ureteroscopy with Laser Lithotripsy and Stent Placement   Mr. Schlotter was contacted and possible surgical dates were discussed, Tuesday September 10th, 2024 was agreed upon for surgery.   Patient was instructed that Dr. Lonna Cobb will require them to provide a pre-op UA & CX prior to surgery. This was ordered and scheduled drop off at [facility] for 07/30/2023.    Patient was directed to call [phone] between 1-3pm the day before surgery to find out surgical arrival time.  Instructions were given not to eat or drink from midnight on the night before surgery and have a driver for the day of surgery. On the surgery day patient was instructed to enter through the Medical Mall entrance of [facility] report the Same Day Surgery desk.   Pre-Admit Testing will be in contact via phone to set up an interview with the anesthesia team to review your history and medications prior to surgery.   Reminder of this information was sent via MyChart to the patient.

## 2023-08-01 LAB — URINE CULTURE: Culture: NO GROWTH

## 2023-08-03 ENCOUNTER — Encounter
Admission: RE | Admit: 2023-08-03 | Discharge: 2023-08-03 | Disposition: A | Discharge status: Home / Self Care | Payer: Medicare HMO | Source: Ambulatory Visit | Attending: Urology | Admitting: Urology

## 2023-08-03 DIAGNOSIS — I25119 Atherosclerotic heart disease of native coronary artery with unspecified angina pectoris: Secondary | ICD-10-CM

## 2023-08-03 DIAGNOSIS — I10 Essential (primary) hypertension: Secondary | ICD-10-CM

## 2023-08-03 DIAGNOSIS — Z01810 Encounter for preprocedural cardiovascular examination: Secondary | ICD-10-CM

## 2023-08-03 DIAGNOSIS — Z01812 Encounter for preprocedural laboratory examination: Secondary | ICD-10-CM

## 2023-08-03 DIAGNOSIS — D696 Thrombocytopenia, unspecified: Secondary | ICD-10-CM

## 2023-08-03 HISTORY — DX: Presence of dental prosthetic device (complete) (partial): Z97.2

## 2023-08-03 HISTORY — DX: Thrombocytopenia, unspecified: D69.6

## 2023-08-03 HISTORY — DX: Complete loss of teeth, unspecified cause, unspecified class: K08.109

## 2023-08-03 HISTORY — DX: Personal history of other medical treatment: Z92.89

## 2023-08-03 MED ORDER — CHLORHEXIDINE GLUCONATE 0.12 % MT SOLN
15.0000 mL | Freq: Once | OROMUCOSAL | Status: AC
  Administered 2023-08-04: 15 mL via OROMUCOSAL

## 2023-08-03 MED ORDER — ORAL CARE MOUTH RINSE: 15.0000 mL | Freq: Once | OROMUCOSAL | Status: AC

## 2023-08-03 MED ORDER — LACTATED RINGERS IV SOLN: INTRAVENOUS | Status: DC

## 2023-08-03 MED ORDER — CEFAZOLIN SODIUM-DEXTROSE 2-4 GM/100ML-% IV SOLN
2.0000 g | INTRAVENOUS | Status: AC
  Administered 2023-08-04: 2 g via INTRAVENOUS

## 2023-08-03 NOTE — Group Note (Deleted)

## 2023-08-03 NOTE — Patient Instructions (Signed)
Your procedure is scheduled on:08-04-23 Tuesday Report to the Registration Desk on the 1st floor of the Medical Mall.Then proceed to the 2nd floor Surgery Desk To find out your arrival time, please call [phone] between 1PM - 3PM on:08-03-23 Monday If your arrival time is 6:00 am, do not arrive before that time as the Medical Mall entrance doors do not open until 6:00 am.  REMEMBER: Instructions that are not followed completely may result in serious medical risk, up to and including death; or upon the discretion of your surgeon and anesthesiologist your surgery may need to be rescheduled.  Do not eat food OR drink any liquids after midnight the night before surgery.  No gum chewing or hard candies.  One week prior to surgery: Stop Anti-inflammatories (NSAIDS) such as Advil, Aleve, Ibuprofen, Motrin, Naproxen, Naprosyn and Aspirin based products such as Excedrin, Goody's Powder, BC Powder. You may however, continue to take Tylenol if needed for pain up until the day of surgery.  Continue taking all prescribed medications   TAKE ONLY THESE MEDICATIONS THE MORNING OF SURGERY WITH A SIP OF WATER: -atenolol (TENORMIN)  -rosuvastatin (CRESTOR)  -omeprazole (PRILOSEC)-take one the night before and one on the morning of surgery - helps to prevent nausea after surgery.)  Do NOT take your 81 mg Aspirin the morning of surgery  No Alcohol for 24 hours before or after surgery.  No Smoking including e-cigarettes for 24 hours before surgery.  No chewable tobacco products for at least 6 hours before surgery.  No nicotine patches on the day of surgery.  Do not use any "recreational" drugs for at least a week (preferably 2 weeks) before your surgery.  Please be advised that the combination of cocaine and anesthesia may have negative outcomes, up to and including death. If you test positive for cocaine, your surgery will be cancelled.  On the morning of surgery brush your teeth with toothpaste and  water, you may rinse your mouth with mouthwash if you wish. Do not swallow any toothpaste or mouthwash.  Do not wear jewelry, make-up, hairpins, clips or nail polish.  Do not wear lotions, powders, or perfumes.   Do not shave body hair from the neck down 48 hours before surgery.  Contact lenses, hearing aids and dentures may not be worn into surgery.  Do not bring valuables to the hospital. [facility] is not responsible for any missing/lost belongings or valuables.   Notify your doctor if there is any change in your medical condition (cold, fever, infection).  Wear comfortable clothing (specific to your surgery type) to the hospital.  After surgery, you can help prevent lung complications by doing breathing exercises.  Take deep breaths and cough every 1-2 hours. Your doctor may order a device called an Incentive Spirometer to help you take deep breaths. When coughing or sneezing, hold a pillow firmly against your incision with both hands. This is called "splinting." Doing this helps protect your incision. It also decreases belly discomfort.  If you are being admitted to the hospital overnight, leave your suitcase in the car. After surgery it may be brought to your room.  In case of increased patient census, it may be necessary for you, the patient, to continue your postoperative care in the Same Day Surgery department.  If you are being discharged the day of surgery, you will not be allowed to drive home. You will need a responsible individual to drive you home and stay with you for 24 hours after surgery.  If you are taking public transportation, you will need to have a responsible individual with you.  Please call the Pre-admissions Testing Dept. at [phone] if you have any questions about these instructions.  Surgery Visitation Policy:  Patients having surgery or a procedure may have two visitors.  Children under the age of 39 must have an adult with them who is not the  patient.

## 2023-08-04 ENCOUNTER — Other Ambulatory Visit: Payer: Self-pay

## 2023-08-04 ENCOUNTER — Ambulatory Visit: Payer: Medicare HMO | Admitting: Urgent Care

## 2023-08-04 ENCOUNTER — Ambulatory Visit: Payer: Medicare HMO

## 2023-08-04 ENCOUNTER — Encounter: Payer: Self-pay | Admitting: Urology

## 2023-08-04 ENCOUNTER — Encounter
Admission: RE | Disposition: A | Discharge status: Home / Self Care | Payer: Self-pay | Source: Home / Self Care | Attending: Urology

## 2023-08-04 ENCOUNTER — Ambulatory Visit: Payer: Self-pay | Admitting: Urgent Care

## 2023-08-04 ENCOUNTER — Ambulatory Visit
Admission: RE | Admit: 2023-08-04 | Discharge: 2023-08-04 | Disposition: A | Discharge status: Home / Self Care | Payer: Medicare HMO | Attending: Urology | Admitting: Urology

## 2023-08-04 DIAGNOSIS — Z79899 Other long term (current) drug therapy: Secondary | ICD-10-CM | POA: Insufficient documentation

## 2023-08-04 DIAGNOSIS — K219 Gastro-esophageal reflux disease without esophagitis: Secondary | ICD-10-CM | POA: Insufficient documentation

## 2023-08-04 DIAGNOSIS — I2510 Atherosclerotic heart disease of native coronary artery without angina pectoris: Secondary | ICD-10-CM | POA: Diagnosis not present

## 2023-08-04 DIAGNOSIS — I10 Essential (primary) hypertension: Secondary | ICD-10-CM | POA: Insufficient documentation

## 2023-08-04 DIAGNOSIS — N202 Calculus of kidney with calculus of ureter: Secondary | ICD-10-CM | POA: Diagnosis not present

## 2023-08-04 DIAGNOSIS — I252 Old myocardial infarction: Secondary | ICD-10-CM | POA: Insufficient documentation

## 2023-08-04 DIAGNOSIS — N201 Calculus of ureter: Secondary | ICD-10-CM

## 2023-08-04 DIAGNOSIS — Z01812 Encounter for preprocedural laboratory examination: Secondary | ICD-10-CM

## 2023-08-04 DIAGNOSIS — I25119 Atherosclerotic heart disease of native coronary artery with unspecified angina pectoris: Secondary | ICD-10-CM

## 2023-08-04 DIAGNOSIS — N5082 Scrotal pain: Secondary | ICD-10-CM | POA: Insufficient documentation

## 2023-08-04 DIAGNOSIS — D696 Thrombocytopenia, unspecified: Secondary | ICD-10-CM

## 2023-08-04 DIAGNOSIS — Z87891 Personal history of nicotine dependence: Secondary | ICD-10-CM | POA: Insufficient documentation

## 2023-08-04 DIAGNOSIS — Z01810 Encounter for preprocedural cardiovascular examination: Secondary | ICD-10-CM

## 2023-08-04 HISTORY — PX: CYSTOSCOPY/URETEROSCOPY/HOLMIUM LASER/STENT PLACEMENT: SHX6546

## 2023-08-04 LAB — CBC
HCT: 39.2 % (ref 39.0–52.0)
Hemoglobin: 13.4 g/dL (ref 13.0–17.0)
MCH: 31.3 pg (ref 26.0–34.0)
MCHC: 34.2 g/dL (ref 30.0–36.0)
MCV: 91.6 fL (ref 80.0–100.0)
Platelets: 59 10*3/uL — ABNORMAL LOW (ref 150–400)
RBC: 4.28 MIL/uL (ref 4.22–5.81)
RDW: 13.3 % (ref 11.5–15.5)
WBC: 4.6 10*3/uL (ref 4.0–10.5)
nRBC: 0 % (ref 0.0–0.2)

## 2023-08-04 LAB — BASIC METABOLIC PANEL
Anion gap: 8 (ref 5–15)
BUN: 14 mg/dL (ref 8–23)
CO2: 25 mmol/L (ref 22–32)
Calcium: 9 mg/dL (ref 8.9–10.3)
Chloride: 108 mmol/L (ref 98–111)
Creatinine, Ser: 1.07 mg/dL (ref 0.61–1.24)
GFR, Estimated: >60 mL/min (ref >60)
Glucose, Bld: 97 mg/dL (ref 70–99)
Potassium: 4.1 mmol/L (ref 3.5–5.1)
Sodium: 141 mmol/L (ref 135–145)

## 2023-08-04 SURGERY — CYSTOSCOPY/URETEROSCOPY/HOLMIUM LASER/STENT PLACEMENT
Anesthesia: General | Site: Ureter | Laterality: Bilateral

## 2023-08-04 MED ORDER — GLYCOPYRROLATE 0.2 MG/ML IJ SOLN
INTRAMUSCULAR | Status: DC | PRN
  Administered 2023-08-04: .2 mg via INTRAVENOUS

## 2023-08-04 MED ORDER — OXYCODONE HCL 5 MG PO TABS
5.0000 mg | ORAL_TABLET | Freq: Once | ORAL | Status: AC
  Administered 2023-08-04: 5 mg via ORAL

## 2023-08-04 MED ORDER — DROPERIDOL 2.5 MG/ML IJ SOLN: 0.6250 mg | Freq: Once | INTRAMUSCULAR | Status: DC | PRN

## 2023-08-04 MED ORDER — FENTANYL CITRATE (PF) 100 MCG/2ML IJ SOLN
INTRAMUSCULAR | Status: AC
  Filled 2023-08-04: qty 2

## 2023-08-04 MED ORDER — IOHEXOL 180 MG/ML  SOLN
INTRAMUSCULAR | Status: DC | PRN
  Administered 2023-08-04: 10 mL

## 2023-08-04 MED ORDER — LIDOCAINE HCL (CARDIAC) PF 100 MG/5ML IV SOSY
PREFILLED_SYRINGE | INTRAVENOUS | Status: DC | PRN
  Administered 2023-08-04: 100 mg via INTRAVENOUS

## 2023-08-04 MED ORDER — TROSPIUM CHLORIDE 20 MG PO TABS: 20.0000 mg | ORAL_TABLET | Freq: Two times a day (BID) | ORAL | 0 refills | Status: DC | PRN

## 2023-08-04 MED ORDER — HYDROCODONE-ACETAMINOPHEN 5-325 MG PO TABS: 1.0000 | ORAL_TABLET | Freq: Four times a day (QID) | ORAL | 0 refills | Status: DC | PRN

## 2023-08-04 MED ORDER — FENTANYL CITRATE (PF) 100 MCG/2ML IJ SOLN
INTRAMUSCULAR | Status: DC | PRN
  Administered 2023-08-04 (×4): 25 ug via INTRAVENOUS

## 2023-08-04 MED ORDER — DEXAMETHASONE SODIUM PHOSPHATE 10 MG/ML IJ SOLN
INTRAMUSCULAR | Status: AC
  Filled 2023-08-04: qty 3

## 2023-08-04 MED ORDER — SODIUM CHLORIDE 0.9 % IR SOLN
Status: DC | PRN
  Administered 2023-08-04 (×2): 3000 mL via INTRAVESICAL

## 2023-08-04 MED ORDER — EPHEDRINE SULFATE (PRESSORS) 50 MG/ML IJ SOLN
INTRAMUSCULAR | Status: DC | PRN
  Administered 2023-08-04: 10 mg via INTRAVENOUS

## 2023-08-04 MED ORDER — TAMSULOSIN HCL 0.4 MG PO CAPS: 0.4000 mg | ORAL_CAPSULE | Freq: Every day | ORAL | 0 refills | Status: DC

## 2023-08-04 MED ORDER — DEXAMETHASONE SODIUM PHOSPHATE 10 MG/ML IJ SOLN
INTRAMUSCULAR | Status: DC | PRN
  Administered 2023-08-04: 10 mg via INTRAVENOUS

## 2023-08-04 MED ORDER — OXYCODONE HCL 5 MG PO TABS
ORAL_TABLET | ORAL | Status: AC
  Filled 2023-08-04: qty 1

## 2023-08-04 MED ORDER — PROPOFOL 10 MG/ML IV BOLUS
INTRAVENOUS | Status: DC | PRN
  Administered 2023-08-04: 200 mg via INTRAVENOUS

## 2023-08-04 MED ORDER — FENTANYL CITRATE (PF) 100 MCG/2ML IJ SOLN
25.0000 ug | INTRAMUSCULAR | Status: DC | PRN
  Administered 2023-08-04 (×4): 25 ug via INTRAVENOUS

## 2023-08-04 MED ORDER — ONDANSETRON HCL 4 MG/2ML IJ SOLN
INTRAMUSCULAR | Status: AC
  Filled 2023-08-04: qty 8

## 2023-08-04 MED ORDER — CHLORHEXIDINE GLUCONATE 0.12 % MT SOLN
OROMUCOSAL | Status: AC
  Filled 2023-08-04: qty 15

## 2023-08-04 MED ORDER — ACETAMINOPHEN 10 MG/ML IV SOLN
INTRAVENOUS | Status: DC | PRN
  Administered 2023-08-04: 1000 mg via INTRAVENOUS

## 2023-08-04 MED ORDER — GLYCOPYRROLATE 0.2 MG/ML IJ SOLN
INTRAMUSCULAR | Status: AC
  Filled 2023-08-04: qty 3

## 2023-08-04 MED ORDER — CEFAZOLIN SODIUM-DEXTROSE 2-4 GM/100ML-% IV SOLN
INTRAVENOUS | Status: AC
  Filled 2023-08-04: qty 100

## 2023-08-04 MED ORDER — ONDANSETRON HCL 4 MG/2ML IJ SOLN
INTRAMUSCULAR | Status: DC | PRN
  Administered 2023-08-04: 4 mg via INTRAVENOUS

## 2023-08-04 SURGICAL SUPPLY — 29 items
BAG DRAIN SIEMENS DORNER NS (MISCELLANEOUS) ×1 IMPLANT
BAG DRN NS LF (MISCELLANEOUS) ×1
BASKET LASER NITINOL 1.9FR (BASKET) IMPLANT
BASKET ZERO TIP 1.9FR (BASKET) IMPLANT
BRUSH SCRUB EZ 1% IODOPHOR (MISCELLANEOUS) ×1 IMPLANT
BSKT STON RTRVL 120 1.9FR (BASKET) ×1
BSKT STON RTRVL ZERO TP 1.9FR (BASKET)
CATH URET FLEX-TIP 2 LUMEN 10F (CATHETERS) IMPLANT
CATH URETL OPEN END 6X70 (CATHETERS) IMPLANT
CNTNR URN SCR LID CUP LEK RST (MISCELLANEOUS) IMPLANT
CONT SPEC 4OZ STRL OR WHT (MISCELLANEOUS)
DRAPE UTILITY 15X26 TOWEL STRL (DRAPES) ×1 IMPLANT
FIBER LASER MOSES 200 DFL (Laser) IMPLANT
GLOVE BIOGEL PI IND STRL 7.5 (GLOVE) ×1 IMPLANT
GOWN STRL REUS W/ TWL LRG LVL3 (GOWN DISPOSABLE) ×1 IMPLANT
GOWN STRL REUS W/ TWL XL LVL3 (GOWN DISPOSABLE) ×1 IMPLANT
GOWN STRL REUS W/TWL LRG LVL3 (GOWN DISPOSABLE) ×1
GOWN STRL REUS W/TWL XL LVL3 (GOWN DISPOSABLE) ×1
GUIDEWIRE STR DUAL SENSOR (WIRE) ×1 IMPLANT
IV NS IRRIG 3000ML ARTHROMATIC (IV SOLUTION) ×1 IMPLANT
KIT TURNOVER CYSTO (KITS) ×1 IMPLANT
PACK CYSTO AR (MISCELLANEOUS) ×1 IMPLANT
SET CYSTO W/LG BORE CLAMP LF (SET/KITS/TRAYS/PACK) ×1 IMPLANT
SHEATH NAVIGATOR HD 12/14X36 (SHEATH) IMPLANT
STENT URET 6FRX24 CONTOUR (STENTS) IMPLANT
STENT URET 6FRX26 CONTOUR (STENTS) IMPLANT
SURGILUBE 2OZ TUBE FLIPTOP (MISCELLANEOUS) ×1 IMPLANT
VALVE UROSEAL ADJ ENDO (VALVE) IMPLANT
WATER STERILE IRR 500ML POUR (IV SOLUTION) ×1 IMPLANT

## 2023-08-04 NOTE — Discharge Instructions (Addendum)
AMBULATORY SURGERY  DISCHARGE INSTRUCTIONS   The drugs that you were given will stay in your system until tomorrow so for the next 24 hours you should not:  Drive an automobile Make any legal decisions Drink any alcoholic beverage   You may resume regular meals tomorrow.  Today it is better to start with liquids and gradually work up to solid foods.  You may eat anything you prefer, but it is better to start with liquids, then soup and crackers, and gradually work up to solid foods.   Please notify your doctor immediately if you have any unusual bleeding, trouble breathing, redness and pain at the surgery site, drainage, fever, or pain not relieved by medication.   Your post-operative visit with Dr.                                     is: Date:                        Time:    Please call to schedule your post-operative visit.  Additional Instructions:  DISCHARGE INSTRUCTIONS FOR KIDNEY STONE/URETERAL STENT   MEDICATIONS:  1. Resume all your other meds from home.  2.  AZO (over-the-counter) can help with the burning/stinging when you urinate. 3.  Hydrocodone is for moderate/severe pain, Rx was sent to your pharmacy. 4.  Tamsulosin and trospium will help with bladder/stent irritation.  Rxs were sent to your pharmacy  ACTIVITY:  1. May resume regular activities in 24 hours. 2. No driving while on narcotic pain medications  3. Drink plenty of water  4. Continue to walk at home - you can still get blood clots when you are at home, so keep active, but don't over do it.  5. May return to work/school tomorrow or when you feel ready   BATHING:  1. You can shower.   SIGNS/SYMPTOMS TO CALL:  Common postoperative symptoms include urinary frequency, urgency, bladder spasm and blood in the urine  Please call us if you have a fever greater than 101.5, uncontrolled nausea/vomiting, uncontrolled pain, dizziness, unable to urinate, excessively bloody urine, chest pain, shortness of  breath, leg swelling, leg pain, or any other concerns or questions.   You can reach [place] at [phone].   FOLLOW-UP:  1. You will be contacted for an appointment to have your stents removed

## 2023-08-04 NOTE — Transfer of Care (Signed)
Immediate Anesthesia Transfer of Care Note  Patient: Luke Wall  Procedure(s) Performed: CYSTOSCOPY/URETEROSCOPY/HOLMIUM LASER/STENT PLACEMENT (Bilateral: Ureter)  Patient Location: PACU  Anesthesia Type:General  Level of Consciousness: drowsy and patient cooperative  Airway & Oxygen Therapy: Patient Spontanous Breathing and Patient connected to face mask oxygen  Post-op Assessment: Report given to RN and Post -op Vital signs reviewed and stable  Post vital signs: Reviewed and stable  Last Vitals:  Vitals Value Taken Time  BP 125/77   Temp    Pulse 47 08/04/23 1730  Resp 12 08/04/23 1730  SpO2 100 % 08/04/23 1730  Vitals shown include unfiled device data.  Last Pain:  Vitals:   08/04/23 1246  TempSrc: Temporal  PainSc: 0-No pain      Patients Stated Pain Goal: 0 (08/04/23 1246)  Complications: No notable events documented.

## 2023-08-04 NOTE — Interval H&P Note (Signed)
History and Physical Interval Note:  08/04/2023 3:16 PM  Luke Wall  has presented today for surgery, with the diagnosis of Bilateral Ureteral Stone.  The various methods of treatment have been discussed with the patient and family. After consideration of risks, benefits and other options for treatment, the patient has consented to  Procedure(s): CYSTOSCOPY/URETEROSCOPY/HOLMIUM LASER/STENT PLACEMENT (Bilateral) as a surgical intervention.  The patient's history has been reviewed, patient examined, no change in status, stable for surgery.  I have reviewed the patient's chart and labs.  Questions were answered to the patient's satisfaction.    CV: RRR Lungs: Clear   Winna Golla C Eduardo Wurth

## 2023-08-04 NOTE — Anesthesia Preprocedure Evaluation (Signed)
Anesthesia Evaluation  Patient identified by MRN, date of birth, ID band Patient awake    Reviewed: Allergy & Precautions, H&P , NPO status , Patient's Chart, lab work & pertinent test results  History of Anesthesia Complications Negative for: history of anesthetic complications  Airway Mallampati: III  TM Distance: >3 FB Neck ROM: full    Dental  (+) Missing, Dental Advidsory Given   Pulmonary neg shortness of breath, sleep apnea , neg COPD, neg recent URI, former smoker   Pulmonary exam normal        Cardiovascular Exercise Tolerance: Good hypertension, (-) angina + CAD and + Past MI  Normal cardiovascular exam(-) dysrhythmias (-) Valvular Problems/Murmurs     Neuro/Psych negative neurological ROS  negative psych ROS   GI/Hepatic Neg liver ROS,GERD  Medicated and Controlled,,  Endo/Other  negative endocrine ROS    Renal/GU      Musculoskeletal  (+) Arthritis ,    Abdominal   Peds  Hematology negative hematology ROS (+)   Anesthesia Other Findings Past Medical History: No date: Coronary artery disease No date: GERD (gastroesophageal reflux disease) No date: History of kidney stones No date: Hypertension 01/31/2016: Nuclear sclerotic cataract of right eye 09/16/2020: Osteoarthritis of left knee  Past Surgical History: 1990's: CARDIAC CATHETERIZATION     Comment:  POBA 2019: CATARACT EXTRACTION W/ INTRAOCULAR LENS  IMPLANT, BILATERAL;  Bilateral 2018: COLONOSCOPY 1976: KNEE ARTHROSCOPY; Left 1960: TONSILLECTOMY     Reproductive/Obstetrics negative OB ROS                             Anesthesia Physical Anesthesia Plan  ASA: 3  Anesthesia Plan: General   Post-op Pain Management:    Induction: Intravenous  PONV Risk Score and Plan:   Airway Management Planned: LMA and Oral ETT  Additional Equipment:   Intra-op Plan:   Post-operative Plan: Extubation in  OR  Informed Consent: I have reviewed the patients History and Physical, chart, labs and discussed the procedure including the risks, benefits and alternatives for the proposed anesthesia with the patient or authorized representative who has indicated his/her understanding and acceptance.     Dental Advisory Given  Plan Discussed with: Anesthesiologist, CRNA and Surgeon  Anesthesia Plan Comments:         Anesthesia Quick Evaluation

## 2023-08-04 NOTE — Anesthesia Procedure Notes (Signed)
Procedure Name: LMA Insertion Date/Time: 08/04/2023 3:41 PM  Performed by: Elmarie Mainland, CRNAPre-anesthesia Checklist: Patient identified, Emergency Drugs available, Suction available and Patient being monitored Patient Re-evaluated:Patient Re-evaluated prior to induction Oxygen Delivery Method: Circle system utilized Preoxygenation: Pre-oxygenation with 100% oxygen Induction Type: IV induction LMA: LMA inserted LMA Size: 4.0 Number of attempts: 1 Placement Confirmation: positive ETCO2 and breath sounds checked- equal and bilateral Tube secured with: Tape Dental Injury: Teeth and Oropharynx as per pre-operative assessment  Comments: Inserted by Hassel Neth

## 2023-08-05 ENCOUNTER — Encounter: Payer: Self-pay | Admitting: Urology

## 2023-08-06 ENCOUNTER — Telehealth: Payer: Self-pay

## 2023-08-06 MED ORDER — SOLIFENACIN SUCCINATE 10 MG PO TABS: 10.0000 mg | ORAL_TABLET | Freq: Every day | ORAL | 0 refills | Status: DC

## 2023-08-06 NOTE — Op Note (Signed)
Preoperative diagnosis: Bilateral ureteral calculi  Postoperative diagnosis: Same  Procedure:  Cystoscopy Bilateral ureteroscopy and stone removal Ureteroscopic laser lithotripsy Bilateral ureteral stent placement Bilateral retrograde pyelography with interpretation  Surgeon: Lorin Picket C. Deziyah Luke Wall, M.D.  Anesthesia: General  Complications: None  Intraoperative findings:  Cystoscopy-urethra normal in caliber without stricture; prostate with moderate lateral lobe enlargement; bladder mucosa without erythema, solid or papillary lesions.  UOs normal-appearing bilaterally in Right ureteroscopy-calculus notified mid ureter Left ureteroscopy-calculi identified distal ureter Right retrograde pyelography post procedure showed no filling defects, stone fragments or contrast extravasation Left retrograde pyelography post procedure showed no filling defects, stone fragments or contrast extravasation.  Left hydronephrosis/hydroureter noted  EBL: Minimal  Specimens: Calculus fragments for analysis   Indication: [name] is a 77 y.o. recently found to have bilateral ureteral calculi; a 5 mm right proximal ureteral calculus with mild hydronephrosis and a nonobstructing left distal ureteral calculus.  Since scheduling he has developed left flank pain.  After reviewing the management options for treatment, the patient elected to proceed with the above surgical procedure(s). We have discussed the potential benefits and risks of the procedure, side effects of the proposed treatment, the likelihood of the patient achieving the goals of the procedure, and any potential problems that might occur during the procedure or recuperation. Informed consent has been obtained.  Description of procedure:  The patient was taken to the operating room and general anesthesia was induced.  The patient was placed in the dorsal lithotomy position, prepped and draped in the usual sterile fashion, and preoperative  antibiotics were administered. A preoperative time-out was performed.   A 21 French cystoscope was lubricated, placed per urethra and advanced proximally under direct vision with findings as described above.    Attention was directed to the right ureteral orifice and a 0.038 Sensor wire was then advanced up the ureter into the renal pelvis under fluoroscopic guidance.  A 4.5 Fr semirigid ureteroscope was then advanced into the ureter next to the guidewire and the calculus was identified as described above.  The stone was then fragmented with a 200 m Moses holmium laser fiber at a setting of 0.3 J/40 hz.   All fragments were then removed from the ureter with a zero tip nitinol basket.  Reinspection of the ureter revealed no remaining visible stones or fragments.   Retrograde pyelogram was performed with findings as described above.  A 6 FR/26 CM Contour ureteral stent was placed under fluoroscopic guidance.  The wire was then removed with an adequate stent curl noted in the renal pelvis as well as in the bladder.  The cystoscope was repassed and a 0.038 Sensor wire was placed into the left UO.  Initial resistance was met however the wire was able to be negotiated past the calculus.  A 4.5 Fr semirigid ureteroscope was then advanced into the ureter next to the guidewire and the calculus was identified as described above.  The stone was then fragmented with a 200 m Moses holmium laser fiber at a setting of 0.3 J/40 hz.   All fragments were then removed from the ureter with a zero tip nitinol basket.  Reinspection of the ureter revealed no remaining visible stones or fragments.   A 6 FR/24 CM Contour ureteral stent was placed under fluoroscopic guidance.  The wire was then removed with an adequate stent curl noted in the renal pelvis as well as in the bladder.  The bladder was then emptied and the procedure ended.  The patient  appeared to tolerate the procedure well and without complications.   After anesthetic reversal the patient was transported to the PACU in stable condition.   Plan: There were marked inflammatory changes of the distal ureter on the left and will plan on leaving indwelling stent x 2 weeks   Luke Axon, MD

## 2023-08-06 NOTE — Anesthesia Postprocedure Evaluation (Signed)
Anesthesia Post Note  Patient: Luke Wall  Procedure(s) Performed: CYSTOSCOPY/URETEROSCOPY/HOLMIUM LASER/STENT PLACEMENT (Bilateral: Ureter)  Patient location during evaluation: PACU Anesthesia Type: General Level of consciousness: awake and alert Pain management: pain level controlled Vital Signs Assessment: post-procedure vital signs reviewed and stable Respiratory status: spontaneous breathing, nonlabored ventilation, respiratory function stable and patient connected to nasal cannula oxygen Cardiovascular status: blood pressure returned to baseline and stable Postop Assessment: no apparent nausea or vomiting Anesthetic complications: no   No notable events documented.   Last Vitals:  Vitals:   08/04/23 1816 08/04/23 1830  BP: (!) 184/71   Pulse: (!) 49 (!) 52  Resp: 14 16  Temp: (!) 36.1 C   SpO2: 100% 100%    Last Pain:  Vitals:   08/04/23 1830  TempSrc:   PainSc: 4                  Yevette Edwards

## 2023-08-06 NOTE — Telephone Encounter (Signed)
Patient called in reports that he has been having burning and pressure described as Ureteral Spasms. Spoke with Dr. Lonna Cobb who recommended sending in patient Vesicare 10mg  daily to help with these symptoms. Patient previously prescribed Trospium but did not tolerate secondary to Nausea and Vomiting.

## 2023-08-14 ENCOUNTER — Encounter: Payer: Medicare HMO | Admitting: Urology

## 2023-08-14 LAB — CALCULI, WITH PHOTOGRAPH (CLINICAL LAB)
Calcium Oxalate Dihydrate: 20 %
Calcium Oxalate Monohydrate: 80 %
Weight Calculi: 31 mg

## 2023-08-19 ENCOUNTER — Encounter: Payer: Self-pay | Admitting: Urology

## 2023-08-19 ENCOUNTER — Other Ambulatory Visit: Payer: Self-pay

## 2023-08-19 ENCOUNTER — Telehealth: Payer: Self-pay

## 2023-08-19 ENCOUNTER — Ambulatory Visit: Payer: Medicare HMO | Admitting: Urology

## 2023-08-19 VITALS — BP 134/77 | HR 50 | Ht 70.0 in | Wt 200.0 lb

## 2023-08-19 DIAGNOSIS — Z466 Encounter for fitting and adjustment of urinary device: Secondary | ICD-10-CM

## 2023-08-19 DIAGNOSIS — Z960 Presence of urogenital implants: Secondary | ICD-10-CM

## 2023-08-19 DIAGNOSIS — N201 Calculus of ureter: Secondary | ICD-10-CM

## 2023-08-19 LAB — URINALYSIS, COMPLETE
Bilirubin, UA: NEGATIVE
Glucose, UA: NEGATIVE
Nitrite, UA: NEGATIVE
Specific Gravity, UA: 1.025 (ref 1.005–1.030)
Urobilinogen, Ur: 2 mg/dL — ABNORMAL HIGH (ref 0.2–1.0)
pH, UA: 5.5 (ref 5.0–7.5)

## 2023-08-19 LAB — MICROSCOPIC EXAMINATION: RBC, Urine: >30 /hpf — AB (ref 0–2)

## 2023-08-19 MED ORDER — SULFAMETHOXAZOLE-TRIMETHOPRIM 800-160 MG PO TABS
1.0000 | ORAL_TABLET | Freq: Once | ORAL | Status: AC
Start: 2023-08-19 — End: 2023-08-19
  Administered 2023-08-19: 1 via ORAL

## 2023-08-19 NOTE — Progress Notes (Signed)
   [facility] Surgical Posting Form  Surgery Date: Date: 08/25/2023  Surgeon: Dr. Irineo Axon, MD  Inpt ( No  )   Outpt (Yes)   Obs ( No  )   Diagnosis: Z96.0 Retained Ureteral Stent  -CPT: 52310  Surgery: Cystoscopy with Bilateral Stent Removal  Stop Anticoagulations: No  Cardiac/Medical/Pulmonary Clearance needed: no  *Orders entered into EPIC  Date: 08/19/23   *Case booked in EPIC  Date: 08/19/23  *Notified pt of Surgery: Date: 08/19/23  PRE-OP UA & CX: yes, ordered in clinic today 08/19/2023  *Placed into Prior Authorization Work Angela Nevin Date: 08/19/23  Assistant/laser/rep:No

## 2023-08-19 NOTE — Telephone Encounter (Signed)
Per Dr. Lonna Cobb,  Patient is to be scheduled for Cystoscopy with Bilateral Stent Removal   Mr. Filkins was contacted and possible surgical dates were discussed, Tuesday October 1st, 2024 was agreed upon for surgery.   Patient was directed to call [phone] between 1-3pm the day before surgery to find out surgical arrival time.  Instructions were given not to eat or drink from midnight on the night before surgery and have a driver for the day of surgery. On the surgery day patient was instructed to enter through the Medical Mall entrance of [facility] report the Same Day Surgery desk.   Pre-Admit Testing will be in contact via phone to set up an interview with the anesthesia team to review your history and medications prior to surgery.   Reminder of this information was sent via MyChart to the patient.

## 2023-08-19 NOTE — Progress Notes (Signed)
Cystoscopy with bilateral ureteral stent removal was attempted today however urine with significant microhematuria and the grasper could not be visualized even with aspirating urine from the bladder.  He will be scheduled for stent removal in same-day surgery under sedation.

## 2023-08-19 NOTE — Progress Notes (Signed)
Surgical Physician Order Form [facility]  Dr. Irineo Axon, MD  * Scheduling expectation : Next Available  *Length of Case: 30 minutes  *Clearance needed: no  *Anticoagulation Instructions: N/A  *Aspirin Instructions: N/A  *Post-op visit Date/Instructions: 3 months with KUB  *Diagnosis: Status post bilateral ureteroscopy  *Procedure: Cystoscopy with bilateral ureteral stent removal   Additional orders: N/A  -Admit type: OUTpatient  -Anesthesia: MAC  -VTE Prophylaxis Standing Order SCD's       Other:   -Standing Lab Orders Per Anesthesia    Lab other: UA&Urine Culture-ordered 9/25  -Standing Test orders EKG/Chest x-ray per Anesthesia       Test other:   - Medications:  Ancef 2gm IV  -Other orders:  N/A

## 2023-08-21 ENCOUNTER — Encounter
Admission: RE | Admit: 2023-08-21 | Discharge: 2023-08-21 | Disposition: A | Discharge status: Home / Self Care | Payer: Medicare HMO | Source: Ambulatory Visit | Attending: Urology | Admitting: Urology

## 2023-08-21 ENCOUNTER — Other Ambulatory Visit: Payer: Self-pay

## 2023-08-21 DIAGNOSIS — D696 Thrombocytopenia, unspecified: Secondary | ICD-10-CM

## 2023-08-21 DIAGNOSIS — Z01812 Encounter for preprocedural laboratory examination: Secondary | ICD-10-CM

## 2023-08-21 DIAGNOSIS — I10 Essential (primary) hypertension: Secondary | ICD-10-CM

## 2023-08-21 HISTORY — DX: Cardiac murmur, unspecified: R01.1

## 2023-08-21 HISTORY — DX: Unilateral inguinal hernia, without obstruction or gangrene, not specified as recurrent: K40.90

## 2023-08-21 HISTORY — DX: Atherosclerosis of aorta: I70.0

## 2023-08-21 HISTORY — DX: Acute myocardial infarction, unspecified: I21.9

## 2023-08-21 HISTORY — DX: Splenomegaly, not elsewhere classified: R16.1

## 2023-08-21 NOTE — Patient Instructions (Addendum)
Your procedure is scheduled on: 08/25/23 - Tuesday Report to the Registration Desk on the 1st floor of the Medical Mall. To find out your arrival time, please call [phone] between 1PM - 3PM on: 08/24/23 - Monday If your arrival time is 6:00 am, do not arrive before that time as the Medical Mall entrance doors do not open until 6:00 am.  REMEMBER: Instructions that are not followed completely may result in serious medical risk, up to and including death; or upon the discretion of your surgeon and anesthesiologist your surgery may need to be rescheduled.  Do not eat food or drink any liquids after midnight the night before surgery.  No gum chewing or hard candies.  One week prior to surgery: Stop Anti-inflammatories (NSAIDS) such as Advil, Aleve, Ibuprofen, Motrin, Naproxen, Naprosyn and Aspirin based products such as Excedrin, Goody's Powder, BC Powder.  Stop ANY OVER THE COUNTER supplements until after surgery.  You may however, continue to take Tylenol if needed for pain up until the day of surgery.   TAKE ONLY THESE MEDICATIONS THE MORNING OF SURGERY WITH A SIP OF WATER:  omeprazole (PRILOSEC) - (take one the night before and one on the morning of surgery - helps to prevent nausea after surgery.) atenolol (TENORMIN)  rosuvastatin (CRESTOR)   No Alcohol for 24 hours before or after surgery.  No Smoking including e-cigarettes for 24 hours before surgery.  No chewable tobacco products for at least 6 hours before surgery.  No nicotine patches on the day of surgery.  Do not use any "recreational" drugs for at least a week (preferably 2 weeks) before your surgery.  Please be advised that the combination of cocaine and anesthesia may have negative outcomes, up to and including death. If you test positive for cocaine, your surgery will be cancelled.  On the morning of surgery brush your teeth with toothpaste and water, you may rinse your mouth with mouthwash if you wish. Do not  swallow any toothpaste or mouthwash.  Use CHG Soap or wipes as directed on instruction sheet.  Do not wear jewelry, make-up, hairpins, clips or nail polish.  For welded (permanent) jewelry: bracelets, anklets, waist bands, etc.  Please have this removed prior to surgery.  If it is not removed, there is a chance that hospital personnel will need to cut it off on the day of surgery.  Do not wear lotions, powders, or perfumes.   Contact lenses, hearing aids and dentures may not be worn into surgery.  Do not bring valuables to the hospital. [facility] is not responsible for any missing/lost belongings or valuables.   Notify your doctor if there is any change in your medical condition (cold, fever, infection).  Wear comfortable clothing (specific to your surgery type) to the hospital.  After surgery, you can help prevent lung complications by doing breathing exercises.  Take deep breaths and cough every 1-2 hours. Your doctor may order a device called an Incentive Spirometer to help you take deep breaths. When coughing or sneezing, hold a pillow firmly against your incision with both hands. This is called "splinting." Doing this helps protect your incision. It also decreases belly discomfort.  If you are being admitted to the hospital overnight, leave your suitcase in the car. After surgery it may be brought to your room.  In case of increased patient census, it may be necessary for you, the patient, to continue your postoperative care in the Same Day Surgery department.  If you are being discharged  the day of surgery, you will not be allowed to drive home. You will need a responsible individual to drive you home and stay with you for 24 hours after surgery.   If you are taking public transportation, you will need to have a responsible individual with you.  Please call the Pre-admissions Testing Dept. at [phone] if you have any questions about these instructions.  Surgery  Visitation Policy:  Patients having surgery or a procedure may have two visitors.  Children under the age of 12 must have an adult with them who is not the patient.  Inpatient Visitation:    Visiting hours are 7 a.m. to 8 p.m. Up to four visitors are allowed at one time in a patient room. The visitors may rotate out with other people during the day.  One visitor age 50 or older may stay with the patient overnight and must be in the room by 8 p.m.

## 2023-08-22 LAB — CULTURE, URINE COMPREHENSIVE

## 2023-08-23 ENCOUNTER — Other Ambulatory Visit (INDEPENDENT_AMBULATORY_CARE_PROVIDER_SITE_OTHER): Payer: Self-pay | Admitting: Urology

## 2023-08-23 ENCOUNTER — Other Ambulatory Visit: Payer: Self-pay | Admitting: Urology

## 2023-08-24 ENCOUNTER — Encounter
Admission: RE | Admit: 2023-08-24 | Discharge: 2023-08-24 | Disposition: A | Discharge status: Home / Self Care | Payer: Medicare HMO | Source: Ambulatory Visit | Attending: Urology | Admitting: Urology

## 2023-08-24 DIAGNOSIS — I10 Essential (primary) hypertension: Secondary | ICD-10-CM | POA: Diagnosis not present

## 2023-08-24 DIAGNOSIS — Z01818 Encounter for other preprocedural examination: Secondary | ICD-10-CM | POA: Insufficient documentation

## 2023-08-24 DIAGNOSIS — D696 Thrombocytopenia, unspecified: Secondary | ICD-10-CM | POA: Diagnosis not present

## 2023-08-24 DIAGNOSIS — Z01810 Encounter for preprocedural cardiovascular examination: Secondary | ICD-10-CM | POA: Diagnosis not present

## 2023-08-24 DIAGNOSIS — Z01812 Encounter for preprocedural laboratory examination: Secondary | ICD-10-CM

## 2023-08-24 LAB — CBC
HCT: 39.9 % (ref 39.0–52.0)
Hemoglobin: 14.3 g/dL (ref 13.0–17.0)
MCH: 31.4 pg (ref 26.0–34.0)
MCHC: 35.8 g/dL (ref 30.0–36.0)
MCV: 87.7 fL (ref 80.0–100.0)
Platelets: 88 10*3/uL — ABNORMAL LOW (ref 150–400)
RBC: 4.55 MIL/uL (ref 4.22–5.81)
RDW: 13.1 % (ref 11.5–15.5)
WBC: 5.3 10*3/uL (ref 4.0–10.5)
nRBC: 0 % (ref 0.0–0.2)

## 2023-08-25 ENCOUNTER — Ambulatory Visit: Payer: Medicare HMO

## 2023-08-25 ENCOUNTER — Encounter: Payer: Self-pay | Admitting: Urology

## 2023-08-25 ENCOUNTER — Encounter
Admission: RE | Disposition: A | Discharge status: Home / Self Care | Payer: Self-pay | Source: Home / Self Care | Attending: Urology

## 2023-08-25 ENCOUNTER — Ambulatory Visit: Payer: Medicare HMO | Admitting: Urgent Care

## 2023-08-25 ENCOUNTER — Other Ambulatory Visit: Payer: Self-pay

## 2023-08-25 ENCOUNTER — Ambulatory Visit: Payer: Self-pay

## 2023-08-25 ENCOUNTER — Ambulatory Visit
Admission: RE | Admit: 2023-08-25 | Discharge: 2023-08-25 | Disposition: A | Discharge status: Home / Self Care | Payer: Medicare HMO | Attending: Urology | Admitting: Urology

## 2023-08-25 DIAGNOSIS — I2510 Atherosclerotic heart disease of native coronary artery without angina pectoris: Secondary | ICD-10-CM | POA: Diagnosis not present

## 2023-08-25 DIAGNOSIS — Z960 Presence of urogenital implants: Secondary | ICD-10-CM

## 2023-08-25 DIAGNOSIS — Z87442 Personal history of urinary calculi: Secondary | ICD-10-CM | POA: Insufficient documentation

## 2023-08-25 DIAGNOSIS — I10 Essential (primary) hypertension: Secondary | ICD-10-CM | POA: Insufficient documentation

## 2023-08-25 DIAGNOSIS — Z466 Encounter for fitting and adjustment of urinary device: Secondary | ICD-10-CM | POA: Diagnosis not present

## 2023-08-25 DIAGNOSIS — M1990 Unspecified osteoarthritis, unspecified site: Secondary | ICD-10-CM | POA: Diagnosis not present

## 2023-08-25 DIAGNOSIS — Z87891 Personal history of nicotine dependence: Secondary | ICD-10-CM | POA: Diagnosis not present

## 2023-08-25 DIAGNOSIS — T8389XA Other specified complication of genitourinary prosthetic devices, implants and grafts, initial encounter: Secondary | ICD-10-CM | POA: Diagnosis not present

## 2023-08-25 DIAGNOSIS — I25118 Atherosclerotic heart disease of native coronary artery with other forms of angina pectoris: Secondary | ICD-10-CM | POA: Diagnosis not present

## 2023-08-25 DIAGNOSIS — K219 Gastro-esophageal reflux disease without esophagitis: Secondary | ICD-10-CM | POA: Diagnosis not present

## 2023-08-25 DIAGNOSIS — I252 Old myocardial infarction: Secondary | ICD-10-CM | POA: Diagnosis not present

## 2023-08-25 HISTORY — PX: CYSTOSCOPY W/ URETERAL STENT REMOVAL: SHX1430

## 2023-08-25 SURGERY — REMOVAL, STENT, URETER, CYSTOSCOPIC
Anesthesia: General | Site: Ureter | Laterality: Bilateral

## 2023-08-25 MED ORDER — CEFAZOLIN SODIUM-DEXTROSE 2-4 GM/100ML-% IV SOLN
2.0000 g | INTRAVENOUS | Status: AC
  Administered 2023-08-25: 2 g via INTRAVENOUS

## 2023-08-25 MED ORDER — SODIUM CHLORIDE 0.9 % IR SOLN
Status: DC | PRN
  Administered 2023-08-25: 300 mL

## 2023-08-25 MED ORDER — PROPOFOL 10 MG/ML IV BOLUS
INTRAVENOUS | Status: AC
  Filled 2023-08-25: qty 20

## 2023-08-25 MED ORDER — LIDOCAINE HCL (PF) 2 % IJ SOLN
INTRAMUSCULAR | Status: DC | PRN
Start: 2023-08-25 — End: 2023-08-25
  Administered 2023-08-25: 60 mg via INTRADERMAL

## 2023-08-25 MED ORDER — SOLIFENACIN SUCCINATE 10 MG PO TABS: 10.0000 mg | ORAL_TABLET | Freq: Every day | ORAL | 0 refills | Status: AC

## 2023-08-25 MED ORDER — FENTANYL CITRATE (PF) 100 MCG/2ML IJ SOLN
INTRAMUSCULAR | Status: DC | PRN
Start: 2023-08-25 — End: 2023-08-25
  Administered 2023-08-25 (×4): 25 ug via INTRAVENOUS

## 2023-08-25 MED ORDER — DEXAMETHASONE SODIUM PHOSPHATE 10 MG/ML IJ SOLN
INTRAMUSCULAR | Status: DC | PRN
Start: 2023-08-25 — End: 2023-08-25
  Administered 2023-08-25: 5 mg via INTRAVENOUS

## 2023-08-25 MED ORDER — ORAL CARE MOUTH RINSE: 15.0000 mL | Freq: Once | OROMUCOSAL | Status: DC

## 2023-08-25 MED ORDER — PROPOFOL 500 MG/50ML IV EMUL
INTRAVENOUS | Status: DC | PRN
Start: 2023-08-25 — End: 2023-08-25
  Administered 2023-08-25: 20 mg via INTRAVENOUS
  Administered 2023-08-25: 150 ug/kg/min via INTRAVENOUS

## 2023-08-25 MED ORDER — TAMSULOSIN HCL 0.4 MG PO CAPS: 0.4000 mg | ORAL_CAPSULE | Freq: Every day | ORAL | 0 refills | Status: AC

## 2023-08-25 MED ORDER — LACTATED RINGERS IV SOLN: INTRAVENOUS | Status: DC

## 2023-08-25 MED ORDER — FENTANYL CITRATE (PF) 100 MCG/2ML IJ SOLN
INTRAMUSCULAR | Status: AC
  Filled 2023-08-25: qty 2

## 2023-08-25 MED ORDER — HYDROCODONE-ACETAMINOPHEN 5-325 MG PO TABS
1.0000 | ORAL_TABLET | Freq: Four times a day (QID) | ORAL | 0 refills | Status: DC | PRN
Start: 2023-08-25 — End: 2023-09-10

## 2023-08-25 MED ORDER — CHLORHEXIDINE GLUCONATE 0.12 % MT SOLN: 15.0000 mL | Freq: Once | OROMUCOSAL | Status: DC

## 2023-08-25 MED ORDER — FENTANYL CITRATE (PF) 100 MCG/2ML IJ SOLN: 25.0000 ug | INTRAMUSCULAR | Status: DC | PRN

## 2023-08-25 SURGICAL SUPPLY — 15 items
BAG DRAIN SIEMENS DORNER NS (MISCELLANEOUS) ×1 IMPLANT
BAG DRN NS LF (MISCELLANEOUS) ×1
GLOVE BIOGEL PI IND STRL 7.5 (GLOVE) ×1 IMPLANT
GOWN STRL REUS W/ TWL LRG LVL3 (GOWN DISPOSABLE) ×2 IMPLANT
GOWN STRL REUS W/ TWL XL LVL3 (GOWN DISPOSABLE) ×1 IMPLANT
GOWN STRL REUS W/TWL LRG LVL3 (GOWN DISPOSABLE) ×2
GOWN STRL REUS W/TWL XL LVL3 (GOWN DISPOSABLE) ×1
GUIDEWIRE STR DUAL SENSOR (WIRE) ×1 IMPLANT
IV NS IRRIG 3000ML ARTHROMATIC (IV SOLUTION) ×1 IMPLANT
PACK CYSTO AR (MISCELLANEOUS) ×1 IMPLANT
SET CYSTO W/LG BORE CLAMP LF (SET/KITS/TRAYS/PACK) ×1 IMPLANT
SURGILUBE 2OZ TUBE FLIPTOP (MISCELLANEOUS) ×1 IMPLANT
SYR TOOMEY IRRIG 70ML (MISCELLANEOUS) ×1
SYRINGE TOOMEY IRRIG 70ML (MISCELLANEOUS) IMPLANT
WATER STERILE IRR 500ML POUR (IV SOLUTION) ×1 IMPLANT

## 2023-08-25 NOTE — H&P (Signed)
Urology H&P   History of Present Illness: Luke Wall is a 77 y.o. male who underwent bilateral ureteroscopy/stone removal 08/04/2023.  Stent removal in office last week unsuccessful due to darker urine and poor visualization with the flexible scope.  He presents for cystoscopy with bilateral stent removal.  Past Medical History:  Diagnosis Date   Aortic atherosclerosis (HCC)    Coronary artery disease    Full dentures    GERD (gastroesophageal reflux disease)    Heart murmur    History of blood transfusion    age 48 after tonisillectomy   History of kidney stones    Hypertension    Myocardial infarction [facility])    Nuclear sclerotic cataract of right eye 01/31/2016   Osteoarthritis of left knee 09/16/2020   Right inguinal hernia    Splenomegaly    Thrombocytopenia (HCC)     Past Surgical History:  Procedure Laterality Date   CARDIAC CATHETERIZATION  1990's   POBA   CATARACT EXTRACTION W/ INTRAOCULAR LENS  IMPLANT, BILATERAL Bilateral 2019   COLONOSCOPY  2018   CYSTOSCOPY/URETEROSCOPY/HOLMIUM LASER/STENT PLACEMENT Bilateral 08/04/2023   Procedure: CYSTOSCOPY/URETEROSCOPY/HOLMIUM LASER/STENT PLACEMENT;  Surgeon: Riki Altes, MD;  Location: [facility] ORS;  Service: Urology;  Laterality: Bilateral;   KNEE ARTHROPLASTY Left 02/08/2021   Procedure: COMPUTER ASSISTED TOTAL KNEE ARTHROPLASTY - RNFA;  Surgeon: Donato Heinz, MD;  Location: [facility] ORS;  Service: Orthopedics;  Laterality: Left;   KNEE ARTHROPLASTY Right 06/17/2021   Procedure: COMPUTER ASSISTED TOTAL KNEE ARTHROPLASTY;  Surgeon: Donato Heinz, MD;  Location: [facility] ORS;  Service: Orthopedics;  Laterality: Right;   KNEE ARTHROSCOPY Left 1976   TONSILLECTOMY  1960    Home Medications:  Current Meds  Medication Sig   acetaminophen (TYLENOL) 500 MG tablet Take 1,000 mg by mouth every 6 (six) hours as needed for moderate pain.   aspirin EC 81 MG tablet Take 81 mg by mouth every morning. Swallow whole.   atenolol  (TENORMIN) 50 MG tablet Take 50 mg by mouth every morning.   HYDROcodone-acetaminophen (NORCO/VICODIN) 5-325 MG tablet Take 1 tablet by mouth every 6 (six) hours as needed for moderate pain.   omeprazole (PRILOSEC) 20 MG capsule Take 20 mg by mouth every morning.   rosuvastatin (CRESTOR) 10 MG tablet Take 1 tablet by mouth every morning.    Allergies:  Allergies  Allergen Reactions   Tramadol Other (See Comments)    Gi distress    History reviewed. No pertinent family history.  Social History:  reports that he has quit smoking. His smoking use included cigarettes. He started smoking about 24 years ago. He has a 5.4 pack-year smoking history. He has been exposed to tobacco smoke. He has never used smokeless tobacco. He reports that he does not currently use alcohol. He reports that he does not use drugs.  ROS: Noncontributory except as per the HPI  Physical Exam:  Vital signs in last 24 hours: Temp:  [98.5 F (36.9 C)] 98.5 F (36.9 C) (10/01 1114) Pulse Rate:  [50] 50 (10/01 1114) Resp:  [16] 16 (10/01 1114) BP: (165)/(74) 165/74 (10/01 1114) SpO2:  [100 %] 100 % (10/01 1114) Weight:  [90.7 kg] 90.7 kg (10/01 1114) Constitutional:  Alert and oriented, No acute distress HEENT: [place] AT Cardiovascular: Regular rate and rhythm, no clubbing, cyanosis, or edema. Respiratory: Normal respiratory effort, lungs clear bilaterally Psychiatric: Normal mood and affect   Laboratory Data:  Recent Labs    08/24/23 0915  WBC 5.3  HGB  14.3  HCT 39.9   No results for input(s): "NA", "K", "CL", "CO2", "GLUCOSE", "BUN", "CREATININE", "CALCIUM" in the last 72 hours. No results for input(s): "LABPT", "INR" in the last 72 hours. No results for input(s): "LABURIN" in the last 72 hours. Results for orders placed or performed in visit on 08/19/23  Microscopic Examination     Status: Abnormal   Collection Time: 08/19/23 10:24 AM   Urine  Result Value Ref Range Status   WBC, UA 11-30 (A) 0 - 5  /hpf Final   RBC, Urine >30 (A) 0 - 2 /hpf Final   Epithelial Cells (non renal) 0-10 0 - 10 /hpf Final   Crystals Present (A) N/A Final   Crystal Type Amorphous Sediment N/A Final   Mucus, UA Present (A) Not Estab. Final   Bacteria, UA Moderate (A) None seen/Few Final  CULTURE, URINE COMPREHENSIVE     Status: None   Collection Time: 08/19/23 10:52 AM   Specimen: Urine   UR  Result Value Ref Range Status   Urine Culture, Comprehensive Final report  Final   Organism ID, Bacteria Comment  Final    Comment: Mixed urogenital flora 5,000  Colonies/mL     Impression/Assessment:  Bilateral indwelling ureteral stents  Plan:  Cystoscopy with stent removal   08/25/2023, 1:07 PM  Irineo Axon,  MD

## 2023-08-25 NOTE — Transfer of Care (Signed)
Immediate Anesthesia Transfer of Care Note  Patient: Luke Wall  Procedure(s) Performed: CYSTOSCOPY WITH STENT REMOVAL (Bilateral: Ureter)  Patient Location: PACU  Anesthesia Type:MAC  Level of Consciousness: awake  Airway & Oxygen Therapy: Patient Spontanous Breathing  Post-op Assessment: Report given to RN and Post -op Vital signs reviewed and stable  Post vital signs: Reviewed and stable  Last Vitals:  Vitals Value Taken Time  BP 110/75   Temp    Pulse 70   Resp 16   SpO2 99     Last Pain:  Vitals:   08/25/23 1114  TempSrc: Temporal  PainSc: 0-No pain         Complications: No notable events documented.

## 2023-08-25 NOTE — Interval H&P Note (Signed)
History and Physical Interval Note:  08/25/2023 1:12 PM  Luke Wall  has presented today for surgery, with the diagnosis of Retained Ureteral Stent.  The various methods of treatment have been discussed with the patient and family. After consideration of risks, benefits and other options for treatment, the patient has consented to  Procedure(s): CYSTOSCOPY WITH STENT REMOVAL (Bilateral) as a surgical intervention.  The patient's history has been reviewed, patient examined, no change in status, stable for surgery.  I have reviewed the patient's chart and labs.  Questions were answered to the patient's satisfaction.    CV: RRR Lungs: Clear   Brodrick Curran C Blain Hunsucker

## 2023-08-25 NOTE — Discharge Instructions (Signed)
Call office for fever/flank pain post stent removal [phone] You will be contacted for a follow-up appointment with x-ray in 6 weeks

## 2023-08-25 NOTE — Anesthesia Preprocedure Evaluation (Signed)
Anesthesia Evaluation  Patient identified by MRN, date of birth, ID band Patient awake    Reviewed: Allergy & Precautions, H&P , NPO status , Patient's Chart, lab work & pertinent test results  History of Anesthesia Complications Negative for: history of anesthetic complications  Airway Mallampati: III  TM Distance: >3 FB Neck ROM: full    Dental  (+) Missing, Dental Advidsory Given   Pulmonary neg shortness of breath, sleep apnea , neg COPD, neg recent URI, former smoker   Pulmonary exam normal        Cardiovascular Exercise Tolerance: Good hypertension, (-) angina + CAD and + Past MI  Normal cardiovascular exam(-) dysrhythmias (-) Valvular Problems/Murmurs     Neuro/Psych negative neurological ROS  negative psych ROS   GI/Hepatic Neg liver ROS,GERD  Medicated and Controlled,,  Endo/Other  negative endocrine ROS    Renal/GU      Musculoskeletal  (+) Arthritis ,    Abdominal   Peds  Hematology negative hematology ROS (+)   Anesthesia Other Findings Past Medical History: No date: Coronary artery disease No date: GERD (gastroesophageal reflux disease) No date: History of kidney stones No date: Hypertension 01/31/2016: Nuclear sclerotic cataract of right eye 09/16/2020: Osteoarthritis of left knee  Past Surgical History: 1990's: CARDIAC CATHETERIZATION     Comment:  POBA 2019: CATARACT EXTRACTION W/ INTRAOCULAR LENS  IMPLANT, BILATERAL;  Bilateral 2018: COLONOSCOPY 1976: KNEE ARTHROSCOPY; Left 1960: TONSILLECTOMY     Reproductive/Obstetrics negative OB ROS                             Anesthesia Physical Anesthesia Plan  ASA: 3  Anesthesia Plan: General   Post-op Pain Management:    Induction: Intravenous  PONV Risk Score and Plan: 2 and Propofol infusion and TIVA  Airway Management Planned: Natural Airway and Simple Face Mask  Additional Equipment:   Intra-op  Plan:   Post-operative Plan: Extubation in OR  Informed Consent: I have reviewed the patients History and Physical, chart, labs and discussed the procedure including the risks, benefits and alternatives for the proposed anesthesia with the patient or authorized representative who has indicated his/her understanding and acceptance.     Dental Advisory Given  Plan Discussed with: Anesthesiologist, CRNA and Surgeon  Anesthesia Plan Comments:         Anesthesia Quick Evaluation

## 2023-08-25 NOTE — Op Note (Signed)
Preoperative diagnosis:  Retained ureteral stents  Postoperative diagnosis:  Same  Procedure: Cystoscopy with stent removal-bilateral  Surgeon: Riki Altes, MD  Anesthesia: MAC  Complications: None  Intraoperative findings:  Mild inflammatory changes secondary to indwelling stents Small stone fragments irrigated from bladder  EBL: Minimal  Specimens: None  Indication: Luke Wall is a 77 y.o. male status post bilateral ureteroscopy with laser lithotripsy and removal of bilateral ureteral calculi 08/04/2023.  Stent removal attempted in office last week however due to impaired visualization with the flexible cystoscope the stents were unable to be identified and removal under sedation was recommended.  After reviewing the management options for treatment, he elected to proceed with the above surgical procedure(s). We have discussed the potential benefits and risks of the procedure, side effects of the proposed treatment, the likelihood of the patient achieving the goals of the procedure, and any potential problems that might occur during the procedure or recuperation. Informed consent has been obtained.  Description of procedure:  The patient was taken to the operating room and IV sedation was obtained by anesthesia.  The patient was placed in the dorsal lithotomy position, prepped and draped in the usual sterile fashion, and preoperative antibiotics were administered. A preoperative time-out was performed.   A 21 French cystoscope was lubricated, inserted per urethra and advanced proximally into the bladder.  A few small stone fragments were irrigated from the bladder.  The left ureteral stent was grasped with endoscopic forceps and removed without difficulty.  The cystoscope was repassed and the right ureteral stent was grasped and removed in a similar fashion.  He tolerated procedure well and was transported to the PACU in stable condition  Plan: Follow-up office visit with  KUB 6 weeks  Riki Altes, M.D.

## 2023-08-26 ENCOUNTER — Other Ambulatory Visit: Payer: Self-pay

## 2023-08-26 ENCOUNTER — Encounter: Payer: Self-pay | Admitting: Urology

## 2023-08-26 DIAGNOSIS — N201 Calculus of ureter: Secondary | ICD-10-CM

## 2023-08-26 NOTE — Anesthesia Postprocedure Evaluation (Signed)
Anesthesia Post Note  Patient: Luke Wall  Procedure(s) Performed: CYSTOSCOPY WITH STENT REMOVAL (Bilateral: Ureter)  Patient location during evaluation: Endoscopy Anesthesia Type: General Level of consciousness: awake and alert Pain management: pain level controlled Vital Signs Assessment: post-procedure vital signs reviewed and stable Respiratory status: spontaneous breathing, nonlabored ventilation and respiratory function stable Cardiovascular status: blood pressure returned to baseline and stable Postop Assessment: no apparent nausea or vomiting Anesthetic complications: no   No notable events documented.   Last Vitals:  Vitals:   08/25/23 1429 08/25/23 1504  BP:  (!) 193/82  Pulse:    Resp: 18   Temp:    SpO2:  99%    Last Pain:  Vitals:   08/25/23 1504  TempSrc:   PainSc: 0-No pain                 Foye Deer

## 2023-08-31 ENCOUNTER — Encounter: Payer: Self-pay | Admitting: Urology

## 2023-09-09 ENCOUNTER — Telehealth: Payer: Self-pay

## 2023-09-09 ENCOUNTER — Other Ambulatory Visit: Payer: Self-pay | Admitting: *Deleted

## 2023-09-09 DIAGNOSIS — N201 Calculus of ureter: Secondary | ICD-10-CM

## 2023-09-09 NOTE — Telephone Encounter (Signed)
Patient LM on triage line stating that he is having RT sided back pain and dysuria. He states that pain meds are not helping. Patient is 2 wks post op stent removal. Please advise.

## 2023-09-09 NOTE — Telephone Encounter (Signed)
Called pt informed him of the information below. Pt voiced understanding.  

## 2023-09-10 ENCOUNTER — Ambulatory Visit
Admission: RE | Admit: 2023-09-10 | Discharge: 2023-09-10 | Disposition: A | Discharge status: Home / Self Care | Payer: Medicare HMO | Attending: Physician Assistant | Admitting: Physician Assistant

## 2023-09-10 ENCOUNTER — Ambulatory Visit: Payer: Medicare HMO | Admitting: Physician Assistant

## 2023-09-10 ENCOUNTER — Ambulatory Visit
Admission: RE | Admit: 2023-09-10 | Discharge: 2023-09-10 | Disposition: A | Discharge status: Home / Self Care | Payer: Medicare HMO | Source: Ambulatory Visit | Attending: Physician Assistant | Admitting: Physician Assistant

## 2023-09-10 VITALS — BP 132/74 | HR 53

## 2023-09-10 DIAGNOSIS — R109 Unspecified abdominal pain: Secondary | ICD-10-CM

## 2023-09-10 DIAGNOSIS — N200 Calculus of kidney: Secondary | ICD-10-CM | POA: Diagnosis not present

## 2023-09-10 DIAGNOSIS — N201 Calculus of ureter: Secondary | ICD-10-CM | POA: Diagnosis not present

## 2023-09-10 DIAGNOSIS — Z87442 Personal history of urinary calculi: Secondary | ICD-10-CM | POA: Diagnosis not present

## 2023-09-10 LAB — URINALYSIS, COMPLETE
Bilirubin, UA: NEGATIVE
Glucose, UA: NEGATIVE
Ketones, UA: NEGATIVE
Nitrite, UA: NEGATIVE
Protein,UA: NEGATIVE
RBC, UA: NEGATIVE
Specific Gravity, UA: 1.025 (ref 1.005–1.030)
Urobilinogen, Ur: 1 mg/dL (ref 0.2–1.0)
pH, UA: 5.5 (ref 5.0–7.5)

## 2023-09-10 LAB — MICROSCOPIC EXAMINATION

## 2023-09-10 MED ORDER — HYDROCODONE-ACETAMINOPHEN 5-325 MG PO TABS: 1.0000 | ORAL_TABLET | Freq: Four times a day (QID) | ORAL | 0 refills | Status: DC | PRN

## 2023-09-10 MED ORDER — SULFAMETHOXAZOLE-TRIMETHOPRIM 800-160 MG PO TABS
1.0000 | ORAL_TABLET | Freq: Two times a day (BID) | ORAL | 0 refills | Status: AC
Start: 2023-09-10 — End: 2023-09-17

## 2023-09-10 NOTE — Patient Instructions (Signed)
Please start Bactrim antibiotic twice daily for a UTI.  Continue Flomax and stay well hydrated.  Take Zofran for nausea and Norco for pain. Please note that Norco contains Tylenol (acetaminophen). You should not take more than 4000mg  of Tylenol in 24 hours. If you are taking Tylenol for other reasons, please consider that dose when taking Norco to keep yourself within this safe daily limit. Call our office or send me a MyChart message on Monday if you are not better by then so we can pursue a CT scan.  Please go to the Emergency Department if you develop any of the following: -Fever/chills -Nausea and/or vomiting uncontrollable with Zofran -Pain uncontrollable with Norco

## 2023-09-10 NOTE — Progress Notes (Signed)
09/10/2023 1:38 PM   Luke Wall Oct 30, 1946 [id number]  CC: Chief Complaint  Patient presents with   Urinary Tract Infection   HPI: Luke Wall is a 77 y.o. male with PMH nephrolithiasis who underwent bilateral ureteroscopy with a visit lithotripsy and stent placement with Dr. Lonna Cobb on 08/04/2023 with follow-up bilateral ureteral stent removal with Dr. Lonna Cobb 16 days ago who presents today for evaluation of flank pain.  He is accompanied today by his wife, who contributes to HPI.  Today he reports a 1 week history of dysuria, hesitancy, and RLQ pain.  He has been having some dry heaves, but denies fever or chills.  KUB today with no radiopaque ureteral stones.  Notably, there was some small stone fragments irrigated from his bladder at the time of stent removal earlier this month.  In-office UA today positive for 1+ leukocytes; urine microscopy with 11-30 WBCs/HPF, calcium oxalate crystals, and moderate bacteria.  PMH: Past Medical History:  Diagnosis Date   Aortic atherosclerosis (HCC)    Coronary artery disease    Full dentures    GERD (gastroesophageal reflux disease)    Heart murmur    History of blood transfusion    age 66 after tonisillectomy   History of kidney stones    Hypertension    Myocardial infarction [facility])    Nuclear sclerotic cataract of right eye 01/31/2016   Osteoarthritis of left knee 09/16/2020   Right inguinal hernia    Splenomegaly    Thrombocytopenia (HCC)     Surgical History: Past Surgical History:  Procedure Laterality Date   CARDIAC CATHETERIZATION  1990's   POBA   CATARACT EXTRACTION W/ INTRAOCULAR LENS  IMPLANT, BILATERAL Bilateral 2019   COLONOSCOPY  2018   CYSTOSCOPY W/ URETERAL STENT REMOVAL Bilateral 08/25/2023   Procedure: CYSTOSCOPY WITH STENT REMOVAL;  Surgeon: Riki Altes, MD;  Location: [facility] OR[facility];  Service: Urology;  Laterality: Bilateral;   CYSTOSCOPY/URETEROSCOPY/HOLMIUM LASER/STENT PLACEMENT Bilateral 08/04/2023    Procedure: CYSTOSCOPY/URETEROSCOPY/HOLMIUM LASER/STENT PLACEMENT;  Surgeon: Riki Altes, MD;  Location: [facility] OR[facility];  Service: Urology;  Laterality: Bilateral;   KNEE ARTHROPLASTY Left 02/08/2021   Procedure: COMPUTER ASSISTED TOTAL KNEE ARTHROPLASTY - RNFA;  Surgeon: Donato Heinz, MD;  Location: [facility] OR[facility];  Service: Orthopedics;  Laterality: Left;   KNEE ARTHROPLASTY Right 06/17/2021   Procedure: COMPUTER ASSISTED TOTAL KNEE ARTHROPLASTY;  Surgeon: Donato Heinz, MD;  Location: [facility] OR[facility];  Service: Orthopedics;  Laterality: Right;   KNEE ARTHROSCOPY Left 1976   TONSILLECTOMY  1960    Home Medications:  Allergies as of 09/10/2023       Reactions   Tramadol Other (See Comments)   Gi distress        Medication List        Accurate as of September 10, 2023  1:38 PM. If you have any questions, ask your nurse or doctor.          acetaminophen 500 MG tablet Commonly known as: TYLENOL Take 1,000 mg by mouth every 6 (six) hours as needed for moderate pain.   aspirin EC 81 MG tablet Take 81 mg by mouth every morning. Swallow whole.   atenolol 50 MG tablet Commonly known as: TENORMIN Take 50 mg by mouth every morning.   HYDROcodone-acetaminophen 5-325 MG tablet Commonly known as: NORCO/VICODIN Take 1 tablet by mouth every 6 (six) hours as needed for moderate pain (pain score 4-6).   omeprazole 20 MG capsule Commonly known as: PRILOSEC Take 20 mg by mouth  every morning.   rosuvastatin 10 MG tablet Commonly known as: CRESTOR Take 1 tablet by mouth every morning.   sulfamethoxazole-trimethoprim 800-160 MG tablet Commonly known as: BACTRIM DS Take 1 tablet by mouth 2 (two) times daily for 7 days.   tamsulosin 0.4 MG Caps capsule Commonly known as: FLOMAX Take 1 capsule (0.4 mg total) by mouth daily after breakfast.        Allergies:  Allergies  Allergen Reactions   Tramadol Other (See Comments)    Gi distress    Family History: No family history on  file.  Social History:   reports that he has quit smoking. His smoking use included cigarettes. He started smoking about 24 years ago. He has a 5.4 pack-year smoking history. He has been exposed to tobacco smoke. He has never used smokeless tobacco. He reports that he does not currently use alcohol. He reports that he does not use drugs.  Physical Exam: BP 132/74   Pulse (!) 53   Constitutional:  Alert and oriented, no acute distress, nontoxic appearing HEENT: [place], AT Cardiovascular: No clubbing, cyanosis, or edema Respiratory: Normal respiratory effort, no increased work of breathing Skin: No rashes, bruises or suspicious lesions Neurologic: Grossly intact, no focal deficits, moving all 4 extremities Psychiatric: Normal mood and affect  Laboratory Data: Results for orders placed or performed in visit on 09/10/23  Microscopic Examination   Urine  Result Value Ref Range   WBC, UA 11-30 (A) 0 - 5 /hpf   RBC, Urine 0-2 0 - 2 /hpf   Epithelial Cells (non renal) 0-10 0 - 10 /hpf   Crystals Present (A) N/A   Crystal Type Calcium Oxalate N/A   Mucus, UA Present (A) Not Estab.   Bacteria, UA Moderate (A) None seen/Few  Urinalysis, Complete  Result Value Ref Range   Specific Gravity, UA 1.025 1.005 - 1.030   pH, UA 5.5 5.0 - 7.5   Color, UA Amber (A) Yellow   Appearance Ur Hazy (A) Clear   Leukocytes,UA 1+ (A) Negative   Protein,UA Negative Negative/Trace   Glucose, UA Negative Negative   Ketones, UA Negative Negative   RBC, UA Negative Negative   Bilirubin, UA Negative Negative   Urobilinogen, Ur 1.0 0.2 - 1.0 mg/dL   Nitrite, UA Negative Negative   Microscopic Examination See below:    Pertinent Imaging: KUB, 09/10/2023: CLINICAL DATA:  Nephrolithiasis   Right-sided pain for 1 week   EXAM: ABDOMEN - 1 VIEW   COMPARISON:  07/29/2023   FINDINGS: Previously seen right ureteral calculus is no longer identified. 4 mm calculus noted in the lower pole of the left kidney.    No dilated loops of bowel to indicate ileus or obstruction.   Degenerative changes seen throughout the lumbar spine.   IMPRESSION: 1. 4 mm calculus seen in the lower pole the left kidney. 2. No right renal or ureteral calculus identified.     Electronically Signed   By: Acquanetta Belling M.D.   On: 10/06/2023 07:22  I personally reviewed the images referenced above and note no radiopaque ureteral stones.  Assessment & Plan:   1. Flank pain with history of urolithiasis History and UA are concerning for UTI versus retained stone fragment.  Fortunately he is afebrile, VSS.  We discussed starting antibiotics for UTI and I offered him CT stone study today, but he prefers to defer this, which is reasonable.  I asked him to reach out to me on Monday if he is no better,  at which point we will pursue CT scan and he is in agreement with this plan.  I counseled him to continue Flomax and stay well-hydrated in the interim and I am refilling his Norco.  He already has enough Zofran at home.  We discussed return precautions including fever, uncontrolled pain, and uncontrolled nausea/vomiting. - Urinalysis, Complete - CULTURE, URINE COMPREHENSIVE - HYDROcodone-acetaminophen (NORCO/VICODIN) 5-325 MG tablet; Take 1 tablet by mouth every 6 (six) hours as needed for moderate pain (pain score 4-6).  Dispense: 6 tablet; Refill: 0 - sulfamethoxazole-trimethoprim (BACTRIM DS) 800-160 MG tablet; Take 1 tablet by mouth 2 (two) times daily for 7 days.  Dispense: 14 tablet; Refill: 0   Return if symptoms worsen or fail to improve.  Carman Ching, PA-C  [facility] [place] [address], [address] [place], [place] [address] [phone]

## 2023-09-13 LAB — CULTURE, URINE COMPREHENSIVE

## 2023-09-21 ENCOUNTER — Telehealth: Payer: Self-pay

## 2023-09-21 ENCOUNTER — Telehealth: Payer: Self-pay | Admitting: *Deleted

## 2023-09-21 NOTE — Telephone Encounter (Signed)
Error

## 2023-09-21 NOTE — Telephone Encounter (Signed)
Pt's wife left message on triage line stating pt is burning and swelling on right side. Call pt to get more information. LVM for pt to return call

## 2023-09-21 NOTE — Telephone Encounter (Signed)
Pt left message on triage VM that he is not doing any better.  Left message on VM to return my call to get more information.

## 2023-09-23 NOTE — Telephone Encounter (Signed)
.  left message to have patient return my call.  

## 2023-09-29 DIAGNOSIS — Z125 Encounter for screening for malignant neoplasm of prostate: Secondary | ICD-10-CM | POA: Diagnosis not present

## 2023-09-29 DIAGNOSIS — E785 Hyperlipidemia, unspecified: Secondary | ICD-10-CM | POA: Diagnosis not present

## 2023-09-29 DIAGNOSIS — I10 Essential (primary) hypertension: Secondary | ICD-10-CM | POA: Diagnosis not present

## 2023-10-06 DIAGNOSIS — K219 Gastro-esophageal reflux disease without esophagitis: Secondary | ICD-10-CM | POA: Diagnosis not present

## 2023-10-06 DIAGNOSIS — Z1331 Encounter for screening for depression: Secondary | ICD-10-CM | POA: Diagnosis not present

## 2023-10-06 DIAGNOSIS — N200 Calculus of kidney: Secondary | ICD-10-CM | POA: Diagnosis not present

## 2023-10-06 DIAGNOSIS — D649 Anemia, unspecified: Secondary | ICD-10-CM | POA: Diagnosis not present

## 2023-10-06 DIAGNOSIS — R972 Elevated prostate specific antigen [PSA]: Secondary | ICD-10-CM | POA: Diagnosis not present

## 2023-10-06 DIAGNOSIS — D696 Thrombocytopenia, unspecified: Secondary | ICD-10-CM | POA: Diagnosis not present

## 2023-10-06 DIAGNOSIS — I10 Essential (primary) hypertension: Secondary | ICD-10-CM | POA: Diagnosis not present

## 2023-10-06 DIAGNOSIS — Z0000 Encounter for general adult medical examination without abnormal findings: Secondary | ICD-10-CM | POA: Diagnosis not present

## 2023-10-06 DIAGNOSIS — I25119 Atherosclerotic heart disease of native coronary artery with unspecified angina pectoris: Secondary | ICD-10-CM | POA: Diagnosis not present

## 2023-10-07 ENCOUNTER — Ambulatory Visit
Admission: RE | Admit: 2023-10-07 | Discharge: 2023-10-07 | Disposition: A | Discharge status: Home / Self Care | Payer: Medicare HMO | Source: Ambulatory Visit | Attending: Urology | Admitting: Urology

## 2023-10-07 ENCOUNTER — Ambulatory Visit: Payer: Medicare HMO | Admitting: Urology

## 2023-10-07 ENCOUNTER — Ambulatory Visit
Admission: RE | Admit: 2023-10-07 | Discharge: 2023-10-07 | Disposition: A | Discharge status: Home / Self Care | Payer: Medicare HMO | Attending: Urology | Admitting: Urology

## 2023-10-07 VITALS — BP 178/72 | HR 47 | Ht 67.0 in | Wt 200.0 lb

## 2023-10-07 DIAGNOSIS — K4090 Unilateral inguinal hernia, without obstruction or gangrene, not specified as recurrent: Secondary | ICD-10-CM | POA: Diagnosis not present

## 2023-10-07 DIAGNOSIS — N200 Calculus of kidney: Secondary | ICD-10-CM | POA: Diagnosis not present

## 2023-10-07 DIAGNOSIS — N201 Calculus of ureter: Secondary | ICD-10-CM

## 2023-10-07 DIAGNOSIS — N2889 Other specified disorders of kidney and ureter: Secondary | ICD-10-CM | POA: Diagnosis not present

## 2023-10-07 NOTE — Progress Notes (Signed)
I, Luke Wall, acting as a scribe for Riki Altes, MD., have documented all relevant documentation on the behalf of Riki Altes, MD, as directed by Riki Altes, MD while in the presence of Riki Altes, MD.  10/07/2023 3:10 PM   Luke Wall 1946/07/01 [id number]  Referring provider: [name], MD [address] [facility] [place],  [place] [address]  Chief Complaint  Patient presents with   Nephrolithiasis    HPI: Luke Wall is a 77 y.o. male presents for a follow-up visit  Ureteroscopic removal of bilateral ureteral calculi 08/04/2023.  Stent removal inoffice 1 week post-op unable to be performed due to impaired visualization and his stents removed in the OR under sedation on 08/25/2023.  He saw Estanislado Pandy in our office on 09/10/23 for a urinary tract infection or for a possible UTI, though urine culture grew mixed flora. At that visit he was complaining of right lower quadrant abdominal pain, which has persisted and he states he has a mass in his right groin region.  No fever, chills, or gross hematuria.  Dysuria and lower urinary tract symptoms are significantly improved.   PMH: Past Medical History:  Diagnosis Date   Aortic atherosclerosis (HCC)    Coronary artery disease    Full dentures    GERD (gastroesophageal reflux disease)    Heart murmur    History of blood transfusion    age 82 after tonisillectomy   History of kidney stones    Hypertension    Myocardial infarction [facility])    Nuclear sclerotic cataract of right eye 01/31/2016   Osteoarthritis of left knee 09/16/2020   Right inguinal hernia    Splenomegaly    Thrombocytopenia (HCC)     Surgical History: Past Surgical History:  Procedure Laterality Date   CARDIAC CATHETERIZATION  1990's   POBA   CATARACT EXTRACTION W/ INTRAOCULAR LENS  IMPLANT, BILATERAL Bilateral 2019   COLONOSCOPY  2018   CYSTOSCOPY W/ URETERAL STENT REMOVAL Bilateral 08/25/2023   Procedure:  CYSTOSCOPY WITH STENT REMOVAL;  Surgeon: Riki Altes, MD;  Location: [facility];  Service: Urology;  Laterality: Bilateral;   CYSTOSCOPY/URETEROSCOPY/HOLMIUM LASER/STENT PLACEMENT Bilateral 08/04/2023   Procedure: CYSTOSCOPY/URETEROSCOPY/HOLMIUM LASER/STENT PLACEMENT;  Surgeon: Riki Altes, MD;  Location: [facility];  Service: Urology;  Laterality: Bilateral;   KNEE ARTHROPLASTY Left 02/08/2021   Procedure: COMPUTER ASSISTED TOTAL KNEE ARTHROPLASTY - RNFA;  Surgeon: Donato Heinz, MD;  Location: [facility];  Service: Orthopedics;  Laterality: Left;   KNEE ARTHROPLASTY Right 06/17/2021   Procedure: COMPUTER ASSISTED TOTAL KNEE ARTHROPLASTY;  Surgeon: Donato Heinz, MD;  Location: [facility];  Service: Orthopedics;  Laterality: Right;   KNEE ARTHROSCOPY Left 1976   TONSILLECTOMY  1960    Home Medications:  Allergies as of 10/07/2023       Reactions   Tramadol Other (See Comments)   Gi distress        Medication List        Accurate as of October 07, 2023  3:10 PM. If you have any questions, ask your nurse or doctor.          acetaminophen 500 MG tablet Commonly known as: TYLENOL Take 1,000 mg by mouth every 6 (six) hours as needed for moderate pain.   albuterol 108 (90 Base) MCG/ACT inhaler Commonly known as: VENTOLIN HFA Inhale into the lungs.   aspirin EC 81 MG tablet Take 81 mg by mouth every morning. Swallow  whole.   atenolol 50 MG tablet Commonly known as: TENORMIN Take 50 mg by mouth every morning.   HYDROcodone-acetaminophen 5-325 MG tablet Commonly known as: NORCO/VICODIN Take 1 tablet by mouth every 6 (six) hours as needed for moderate pain (pain score 4-6).   omeprazole 20 MG capsule Commonly known as: PRILOSEC Take 20 mg by mouth every morning.   rosuvastatin 10 MG tablet Commonly known as: CRESTOR Take 1 tablet by mouth every morning.   solifenacin 10 MG tablet Commonly known as: VESICARE Take 10 mg by mouth daily.   tamsulosin 0.4 MG Caps  capsule Commonly known as: FLOMAX Take 1 capsule (0.4 mg total) by mouth daily after breakfast.        Allergies:  Allergies  Allergen Reactions   Tramadol Other (See Comments)    Gi distress    Social History:  reports that he has quit smoking. His smoking use included cigarettes. He started smoking about 24 years ago. He has a 5.4 pack-year smoking history. He has been exposed to tobacco smoke. He has never used smokeless tobacco. He reports that he does not currently use alcohol. He reports that he does not use drugs.   Physical Exam: BP (!) 178/72   Pulse (!) 47   Ht 5\' 7"  (1.702 m)   Wt 200 lb (90.7 kg)   BMI 31.32 kg/m   Constitutional:  Alert and oriented, No acute distress. HEENT: [place] AT Respiratory: Normal respiratory effort, no increased work of breathing. GI: Abdomen is soft, nontender, nondistended, no abdominal masses GU: Phallus without lesions. Testes descended bilaterallu without masses.  Soft, mobile mass right groin region. Psychiatric: Normal mood and affect.   Assessment & Plan:    1. Rigth inguinal hernia. Symptomatic Will refer to [facility] for further evaluation/management.  Instructed to proceed to ED for worsening pain.    I have reviewed the above documentation for accuracy and completeness, and I agree with the above.   Riki Altes, MD  [facility] [address] [place], [place] [address] [phone]

## 2023-10-13 DIAGNOSIS — K4090 Unilateral inguinal hernia, without obstruction or gangrene, not specified as recurrent: Secondary | ICD-10-CM | POA: Diagnosis not present

## 2023-10-13 DIAGNOSIS — R160 Hepatomegaly, not elsewhere classified: Secondary | ICD-10-CM | POA: Diagnosis not present

## 2023-10-13 DIAGNOSIS — K7689 Other specified diseases of liver: Secondary | ICD-10-CM | POA: Diagnosis not present

## 2023-10-19 ENCOUNTER — Other Ambulatory Visit: Payer: Self-pay | Admitting: Surgery

## 2023-10-19 DIAGNOSIS — K7689 Other specified diseases of liver: Secondary | ICD-10-CM

## 2023-10-20 ENCOUNTER — Ambulatory Visit
Admission: RE | Admit: 2023-10-20 | Discharge: 2023-10-20 | Disposition: A | Discharge status: Home / Self Care | Payer: Medicare HMO | Source: Ambulatory Visit | Attending: Surgery | Admitting: Surgery

## 2023-10-20 DIAGNOSIS — K7460 Unspecified cirrhosis of liver: Secondary | ICD-10-CM | POA: Diagnosis not present

## 2023-10-20 DIAGNOSIS — R188 Other ascites: Secondary | ICD-10-CM | POA: Diagnosis not present

## 2023-10-20 DIAGNOSIS — K7689 Other specified diseases of liver: Secondary | ICD-10-CM | POA: Diagnosis not present

## 2023-10-20 DIAGNOSIS — R161 Splenomegaly, not elsewhere classified: Secondary | ICD-10-CM | POA: Diagnosis not present

## 2023-10-20 DIAGNOSIS — K766 Portal hypertension: Secondary | ICD-10-CM | POA: Diagnosis not present

## 2023-10-20 MED ORDER — GADOBUTROL 1 MMOL/ML IV SOLN
9.0000 mL | Freq: Once | INTRAVENOUS | Status: AC | PRN
  Administered 2023-10-20: 9 mL via INTRAVENOUS

## 2023-10-21 ENCOUNTER — Encounter: Payer: Self-pay | Admitting: Surgery

## 2023-10-21 DIAGNOSIS — C220 Liver cell carcinoma: Secondary | ICD-10-CM

## 2023-10-23 ENCOUNTER — Other Ambulatory Visit: Payer: Self-pay | Admitting: Surgery

## 2023-10-23 DIAGNOSIS — C220 Liver cell carcinoma: Secondary | ICD-10-CM

## 2023-10-27 ENCOUNTER — Inpatient Hospital Stay: Payer: Medicare HMO | Attending: Oncology | Admitting: Oncology

## 2023-10-27 ENCOUNTER — Encounter: Payer: Self-pay | Admitting: Oncology

## 2023-10-27 ENCOUNTER — Inpatient Hospital Stay: Payer: Medicare HMO

## 2023-10-27 VITALS — BP 147/70 | HR 51 | Temp 99.1°F | Resp 18 | Ht 67.0 in | Wt 184.1 lb

## 2023-10-27 DIAGNOSIS — Z7189 Other specified counseling: Secondary | ICD-10-CM

## 2023-10-27 DIAGNOSIS — R0789 Other chest pain: Secondary | ICD-10-CM | POA: Insufficient documentation

## 2023-10-27 DIAGNOSIS — R5383 Other fatigue: Secondary | ICD-10-CM

## 2023-10-27 DIAGNOSIS — C220 Liver cell carcinoma: Secondary | ICD-10-CM | POA: Insufficient documentation

## 2023-10-27 DIAGNOSIS — F1010 Alcohol abuse, uncomplicated: Secondary | ICD-10-CM | POA: Diagnosis not present

## 2023-10-27 DIAGNOSIS — K4090 Unilateral inguinal hernia, without obstruction or gangrene, not specified as recurrent: Secondary | ICD-10-CM | POA: Insufficient documentation

## 2023-10-27 DIAGNOSIS — K7460 Unspecified cirrhosis of liver: Secondary | ICD-10-CM | POA: Diagnosis not present

## 2023-10-27 DIAGNOSIS — Z87891 Personal history of nicotine dependence: Secondary | ICD-10-CM | POA: Diagnosis not present

## 2023-10-27 LAB — CBC WITH DIFFERENTIAL/PLATELET
Abs Immature Granulocytes: 0.01 10*3/uL (ref 0.00–0.07)
Basophils Absolute: 0 10*3/uL (ref 0.0–0.1)
Basophils Relative: 0 %
Eosinophils Absolute: 0.1 10*3/uL (ref 0.0–0.5)
Eosinophils Relative: 1 %
HCT: 39.2 % (ref 39.0–52.0)
Hemoglobin: 13.4 g/dL (ref 13.0–17.0)
Immature Granulocytes: 0 %
Lymphocytes Relative: 18 %
Lymphs Abs: 0.8 10*3/uL (ref 0.7–4.0)
MCH: 31.8 pg (ref 26.0–34.0)
MCHC: 34.2 g/dL (ref 30.0–36.0)
MCV: 92.9 fL (ref 80.0–100.0)
Monocytes Absolute: 0.7 10*3/uL (ref 0.1–1.0)
Monocytes Relative: 15 %
Neutro Abs: 2.9 10*3/uL (ref 1.7–7.7)
Neutrophils Relative %: 66 %
Platelets: 71 10*3/uL — ABNORMAL LOW (ref 150–400)
RBC: 4.22 MIL/uL (ref 4.22–5.81)
RDW: 13.5 % (ref 11.5–15.5)
Smear Review: DECREASED
WBC: 4.5 10*3/uL (ref 4.0–10.5)
nRBC: 0 % (ref 0.0–0.2)

## 2023-10-27 LAB — COMPREHENSIVE METABOLIC PANEL
ALT: 27 U/L (ref 0–44)
AST: 44 U/L — ABNORMAL HIGH (ref 15–41)
Albumin: 3.1 g/dL — ABNORMAL LOW (ref 3.5–5.0)
Alkaline Phosphatase: 57 U/L (ref 38–126)
Anion gap: 10 (ref 5–15)
BUN: 16 mg/dL (ref 8–23)
CO2: 19 mmol/L — ABNORMAL LOW (ref 22–32)
Calcium: 9 mg/dL (ref 8.9–10.3)
Chloride: 108 mmol/L (ref 98–111)
Creatinine, Ser: 0.97 mg/dL (ref 0.61–1.24)
GFR, Estimated: >60 mL/min (ref >60)
Glucose, Bld: 100 mg/dL — ABNORMAL HIGH (ref 70–99)
Potassium: 4.1 mmol/L (ref 3.5–5.1)
Sodium: 137 mmol/L (ref 135–145)
Total Bilirubin: 1.7 mg/dL — ABNORMAL HIGH (ref <1.2)
Total Protein: 6.5 g/dL (ref 6.5–8.1)

## 2023-10-27 LAB — PROTIME-INR
INR: 1.3 — ABNORMAL HIGH (ref 0.8–1.2)
Prothrombin Time: 16.3 s — ABNORMAL HIGH (ref 11.4–15.2)

## 2023-10-27 NOTE — Progress Notes (Signed)
Referral sent to [facility] transplant and [facility]. Provided nurse navigator contact information and encouraged to call with any needs.

## 2023-10-27 NOTE — Progress Notes (Signed)
Hematology/Oncology Consult note [facility] [phone] [phone]  Patient Care Team: [name], MD as PCP - General (Family Medicine) [name], RN as Oncology Nurse Navigator [name], MD as Consulting Physician (Oncology)   Name of the patient: Luke Wall  [id number]  30-Dec-1945    Reason for referral-new diagnosis of hepatocellular carcinoma   Referring physician-Dr. Tonna Boehringer  Date of visit: 10/27/23   History of presenting illness- Patient is a 77 year old male who was seen Dr. Tonna Boehringer for unilateral right inguinal hernia.  He underwent CT abdomen which showed possible cirrhosis as well as indeterminate 2.2 cm lesion in the right hepatic lobe along with bilateral kidney stones with mild right hydronephrosis and proximal hydroureter and mild splenomegaly.  This was followed by MRI liver with and without contrast which showed cirrhotic changes in the liver with portal venous hypertension, portal venous collaterals splenomegaly and ascites.  He was noted to have 2 lesions 1 involving the anterior aspect of segment 6 measuring 2 cm characterized as LI-RADS 5.  There was another 2.3 x 2.2 cm lesion in segment 6 which was also characterized as LI-RADS 5.  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  He was not ever told that he has cirrhosis and has never seen GI for the symptoms.  Currently reports lower abdominal pain.  He has ongoing fatigue and occasional chest wall pain  ECOG PS- 1  Pain scale- 3   Review of systems- Review of Systems  Constitutional:  Positive for malaise/fatigue. Negative for chills, fever and weight loss.  HENT:  Negative for congestion, ear discharge and nosebleeds.   Eyes:  Negative for blurred vision.  Respiratory:  Negative for cough, hemoptysis, sputum production,  shortness of breath and wheezing.   Cardiovascular:  Negative for chest pain, palpitations, orthopnea and claudication.  Gastrointestinal:  Positive for abdominal pain. Negative for blood in stool, constipation, diarrhea, heartburn, melena, nausea and vomiting.  Genitourinary:  Negative for dysuria, flank pain, frequency, hematuria and urgency.  Musculoskeletal:  Negative for back pain, joint pain and myalgias.  Skin:  Negative for rash.  Neurological:  Negative for dizziness, tingling, focal weakness, seizures, weakness and headaches.  Endo/Heme/Allergies:  Does not bruise/bleed easily.  Psychiatric/Behavioral:  Negative for depression and suicidal ideas. The patient does not have insomnia.     Allergies  Allergen Reactions   Tramadol Other (See Comments)    Gi distress    Patient Active Problem List   Diagnosis Date Noted   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   Status post total left knee replacement 02/08/2021   Coronary artery disease involving native coronary artery of native heart with angina pectoris (HCC) 08/09/2019   Thrombocytopenia (HCC) 05/31/2019   Class 1 obesity due to excess calories with serious comorbidity and body mass index (BMI) of 31.0 to 31.9 in adult 11/23/2018   Gastroesophageal reflux disease without esophagitis 11/23/2018   Impaired fasting glucose 09/17/2017   Essential hypertension 12/23/2016   Nuclear sclerotic cataract of right eye 01/31/2016     Past Medical History:  Diagnosis Date   Aortic atherosclerosis (HCC)    Coronary artery disease    Full dentures    GERD (gastroesophageal reflux disease)    Heart murmur    History of blood transfusion    age 3 after tonisillectomy   History of kidney  stones    Hypertension    Myocardial infarction [facility])    Nuclear sclerotic cataract of right eye 01/31/2016   Osteoarthritis of left knee 09/16/2020   Right inguinal hernia    Splenomegaly    Thrombocytopenia (HCC)      Past Surgical  History:  Procedure Laterality Date   CARDIAC CATHETERIZATION  1990's   POBA   CATARACT EXTRACTION W/ INTRAOCULAR LENS  IMPLANT, BILATERAL Bilateral 2019   COLONOSCOPY  2018   CYSTOSCOPY W/ URETERAL STENT REMOVAL Bilateral 08/25/2023   Procedure: CYSTOSCOPY WITH STENT REMOVAL;  Surgeon: Riki Altes, MD;  Location: [facility] OR[facility];  Service: Urology;  Laterality: Bilateral;   CYSTOSCOPY/URETEROSCOPY/HOLMIUM LASER/STENT PLACEMENT Bilateral 08/04/2023   Procedure: CYSTOSCOPY/URETEROSCOPY/HOLMIUM LASER/STENT PLACEMENT;  Surgeon: Riki Altes, MD;  Location: [facility];  Service: Urology;  Laterality: Bilateral;   KNEE ARTHROPLASTY Left 02/08/2021   Procedure: COMPUTER ASSISTED TOTAL KNEE ARTHROPLASTY - RNFA;  Surgeon: Donato Heinz, MD;  Location: [facility] OR[facility];  Service: Orthopedics;  Laterality: Left;   KNEE ARTHROPLASTY Right 06/17/2021   Procedure: COMPUTER ASSISTED TOTAL KNEE ARTHROPLASTY;  Surgeon: Donato Heinz, MD;  Location: [facility];  Service: Orthopedics;  Laterality: Right;   KNEE ARTHROSCOPY Left 1976   TONSILLECTOMY  1960    Social History   Socioeconomic History   Marital status: Married    Spouse name: Bonita Quin   Number of children: Not on file   Years of education: Not on file   Highest education level: Not on file  Occupational History   Not on file  Tobacco Use   Smoking status: Former    Current packs/day: 0.50    Average packs/day: 0.5 packs/day for 10.9 years (5.5 ttl pk-yrs)    Types: Cigarettes    Start date: 2000    Passive exposure: Past   Smokeless tobacco: Never  Vaping Use   Vaping status: Never Used  Substance and Sexual Activity   Alcohol use: Not Currently   Drug use: Never   Sexual activity: Not on file  Other Topics Concern   Not on file  Social History Narrative   Not on file   Social Determinants of Health   Financial Resource Strain: Patient Declined (10/06/2023)   Received from [facility]   Overall Financial Resource  Strain (CARDIA)    Difficulty of Paying Living Expenses: Patient declined  Food Insecurity: No Food Insecurity (10/27/2023)   Hunger Vital Sign    Worried About Running Out of Food in the Last Year: Never true    Ran Out of Food in the Last Year: Never true  Transportation Needs: No Transportation Needs (10/27/2023)   PRAPARE - Administrator, Civil Service (Medical): No    Lack of Transportation (Non-Medical): No  Physical Activity: Not on file  Stress: Not on file  Social Connections: Not on file  Intimate Partner Violence: Not At Risk (10/27/2023)   Humiliation, Afraid, Rape, and Kick questionnaire    Fear of Current or Ex-Partner: No    Emotionally Abused: No    Physically Abused: No    Sexually Abused: No     Family History  Family history unknown: Yes     Current Outpatient Medications:    acetaminophen (TYLENOL) 500 MG tablet, Take 1,000 mg by mouth every 6 (six) hours as needed for moderate pain., Disp: , Rfl:    albuterol (VENTOLIN HFA) 108 (90 Base) MCG/ACT inhaler, Inhale into the lungs., Disp: , Rfl:    aspirin EC  81 MG tablet, Take 81 mg by mouth every morning. Swallow whole., Disp: , Rfl:    atenolol (TENORMIN) 50 MG tablet, Take 50 mg by mouth every morning., Disp: , Rfl:    HYDROcodone-acetaminophen (NORCO/VICODIN) 5-325 MG tablet, Take 1 tablet by mouth every 6 (six) hours as needed for moderate pain (pain score 4-6)., Disp: 6 tablet, Rfl: 0   omeprazole (PRILOSEC) 20 MG capsule, Take 20 mg by mouth every morning., Disp: , Rfl:    rosuvastatin (CRESTOR) 10 MG tablet, Take 1 tablet by mouth every morning., Disp: , Rfl:    solifenacin (VESICARE) 10 MG tablet, Take 10 mg by mouth daily., Disp: , Rfl:    tamsulosin (FLOMAX) 0.4 MG CAPS capsule, Take 1 capsule (0.4 mg total) by mouth daily after breakfast., Disp: 30 capsule, Rfl: 0   Physical exam:  Vitals:   10/27/23 1119  BP: (!) 147/70  Pulse: (!) 51  Resp: 18  Temp: 99.1 F (37.3 C)  TempSrc:  Tympanic  SpO2: 100%  Weight: 184 lb 1.6 oz (83.5 kg)  Height: 5\' 7"  (1.702 m)   Physical Exam Cardiovascular:     Rate and Rhythm: Normal rate and regular rhythm.     Heart sounds: Normal heart sounds.  Pulmonary:     Effort: Pulmonary effort is normal.     Breath sounds: Normal breath sounds.  Abdominal:     General: Bowel sounds are normal. There is no distension.     Palpations: Abdomen is soft.     Tenderness: There is no abdominal tenderness.     Comments: Palpable right inguinal hernia.  No palpable splenomegaly  Skin:    General: Skin is warm and dry.  Neurological:     Mental Status: He is alert and oriented to person, place, and time.           Latest Ref Rng & Units 10/27/2023   12:51 PM  CMP  Glucose 70 - 99 mg/dL 829   BUN 8 - 23 mg/dL 16   Creatinine 5.62 - 1.24 mg/dL 1.30   Sodium 865 - 784 mmol/L 137   Potassium 3.5 - 5.1 mmol/L 4.1   Chloride 98 - 111 mmol/L 108   CO2 22 - 32 mmol/L 19   Calcium 8.9 - 10.3 mg/dL 9.0   Total Protein 6.5 - 8.1 g/dL 6.5   Total Bilirubin <6.9 mg/dL 1.7   Alkaline Phos 38 - 126 U/L 57   AST 15 - 41 U/L 44   ALT 0 - 44 U/L 27       Latest Ref Rng & Units 10/27/2023   12:51 PM  CBC  WBC 4.0 - 10.5 K/uL 4.5   Hemoglobin 13.0 - 17.0 g/dL 62.9   Hematocrit 52.8 - 52.0 % 39.2   Platelets 150 - 400 K/uL 71     No images are attached to the encounter.  Abdomen 1 view (KUB)  Result Date: 10/23/2023 CLINICAL DATA:  Ureteral stone. EXAM: ABDOMEN - 1 VIEW COMPARISON:  09/10/2023. FINDINGS: The bowel gas pattern is normal. Calcifications overlying both kidneys consistent with stones. IMPRESSION: Bilateral nephrolithiasis. Electronically Signed   By: Layla Maw M.D.   On: 10/23/2023 17:08   MR LIVER W WO CONTRAST  Result Date: 10/20/2023 CLINICAL DATA:  Follow-up abnormal CT scan suggesting cirrhosis and indeterminate hepatic lesion. EXAM: MRI ABDOMEN WITHOUT AND WITH CONTRAST TECHNIQUE: Multiplanar multisequence MR  imaging of the abdomen was performed both before and after the administration of intravenous contrast. CONTRAST:  9mL GADAVIST GADOBUTROL 1 MMOL/ML IV SOLN COMPARISON:  CT scan 07/10/2023 FINDINGS: Lower chest: The lung bases are clear of an acute process. No pulmonary lesions, pleural or pericardial effusion. Hepatobiliary: Cirrhotic changes involving the liver with hepatic contour irregularity, dilated hepatic fissures and increased caudate to right lobe ratio. There is also portal venous hypertension, portal venous collaterals and splenomegaly and ascites. The gallbladder is unremarkable.  No biliary dilatation. There is a 2 point by 2.0 cm early arterial phase enhancing lesion in the anterior aspect of segment 6 best seen on image 24 of series 14. This lesion is very difficult to see on the T2 weighted images. It demonstrates very slight increased signal intensity. The lesion is weakly diffusion positive. It also demonstrates near complete washout and subsequent thin capsular enhancement. This is considered a LI-RADS 5. There is a second early arterial phase enhancing lesion in segment 6 posteriorly best seen the same image 24 of series 14. This also demonstrates washout but no definite rim like capsular enhancement. The lesion is also faintly positive on the precontrast sequence. This lesion measures 2.3 x 2.2 cm. This is also considered a LI-RADS 5. No other hepatic lesions are identified. Pancreas: Moderate pancreatic atrophy but no mass or inflammation or ductal dilatation. Spleen: Moderate splenomegaly. The spleen measures 14.5 x 12.0 x 7.7 cm. No splenic lesions. Adrenals/Urinary Tract: The adrenal glands and kidneys are unremarkable. Stomach/Bowel: Stomach, duodenum, visualized small bowel and visualized colon unremarkable. Vascular/Lymphatic: The major vascular structures are patent. No abdominal lymphadenopathy. Other:  Small volume abdominal ascites and mesenteric edema. Musculoskeletal: No significant  bony findings. IMPRESSION: 1. Cirrhotic changes involving the liver with portal venous hypertension, portal venous collaterals, splenomegaly and ascites. 2. Two early arterial phase enhancing lesions in segment 6 of the liver, both considered LI-RADS 5 lesions. 3. No abdominal lymphadenopathy. Electronically Signed   By: Rudie Meyer M.D.   On: 10/20/2023 15:33    Assessment and plan- Patient is a 77 y.o. male referred for hepatocellular carcinoma  Hepatocellular carcinoma: I have reviewed MRI liver with and without contrast images independently and discussed findings with the patient which shows 2 lesions roughly measuring 2 cm in size in the right hepatic lobe which were characterized as LI-RADS 5 which would be diagnostic of hepatocellular carcinoma in the setting of cirrhosis.  LI-RADS 5 lesions do not require biopsy for confirmation.  Given the presence of cirrhosis portal hypertension as well as thrombocytopenia I doubt that he would be a candidate for definitive resection.  I will be discussing his case at tumor board this week to see if he would be a candidate for ablation of these 2 lesions.  I am also referring the patient to [facility] transplant team to evaluate him for liver transplant down the line.  There would be no role for systemic treatment at this time in the absence of metastatic disease.  I am getting a CT chest without contrast to complete his staging workup.  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  However other etiologies including hepatitis need to be ruled out.  He has never seen gastroenterology for his cirrhosis and he will also need EGD to evaluate for varices and further management of his ascites.  I am therefore referring him to [facility] for the same.  With regards to surgery for his right inguinal hernia-he will need preoperative clearance and this will need to come from GI as it is his cirrhosis that would determine his preoperative risk and  not his HCC.  I  have explained all this to the patient and his significant other in detail  I am checking CBC with differential, CMP, PT/INR to calculate his child Pugh score.  Also checking AFP levels today.  I will tentatively see him back in 2 weeks time to discuss the results of labs and CT scan   Thank you for this kind referral and the opportunity to participate in the care of this patient   Visit Diagnosis 1. Hepatocellular carcinoma (HCC)   2. Goals of care, counseling/discussion     Dr. Owens Shark, MD, MPH [facility] [phone] 10/27/2023

## 2023-10-28 DIAGNOSIS — K7460 Unspecified cirrhosis of liver: Secondary | ICD-10-CM | POA: Diagnosis not present

## 2023-10-28 DIAGNOSIS — R188 Other ascites: Secondary | ICD-10-CM | POA: Diagnosis not present

## 2023-10-28 DIAGNOSIS — K766 Portal hypertension: Secondary | ICD-10-CM | POA: Diagnosis not present

## 2023-10-28 DIAGNOSIS — R161 Splenomegaly, not elsewhere classified: Secondary | ICD-10-CM | POA: Diagnosis not present

## 2023-10-28 DIAGNOSIS — C220 Liver cell carcinoma: Secondary | ICD-10-CM | POA: Diagnosis not present

## 2023-10-28 DIAGNOSIS — Z1159 Encounter for screening for other viral diseases: Secondary | ICD-10-CM | POA: Diagnosis not present

## 2023-10-28 LAB — AFP TUMOR MARKER: AFP, Serum, Tumor Marker: 4.3 ng/mL (ref 0.0–8.4)

## 2023-10-30 ENCOUNTER — Other Ambulatory Visit: Payer: Self-pay

## 2023-10-30 ENCOUNTER — Other Ambulatory Visit: Payer: Self-pay | Admitting: Oncology

## 2023-10-30 ENCOUNTER — Telehealth: Payer: Self-pay

## 2023-10-30 DIAGNOSIS — C220 Liver cell carcinoma: Secondary | ICD-10-CM

## 2023-10-30 NOTE — Telephone Encounter (Signed)
Called and spoke to Mr. Bussell and Bonita Quin regarding recommendations from [facility]. Referral sent to IR for consult. They will contact him for scheduling. He is asking if someone can assist with his pain management. He continues to have abdominal pain.

## 2023-11-03 ENCOUNTER — Inpatient Hospital Stay
Admission: RE | Admit: 2023-11-03 | Discharge: 2023-11-03 | Discharge status: Home / Self Care | Payer: Medicare HMO | Source: Ambulatory Visit | Attending: Oncology

## 2023-11-03 DIAGNOSIS — C220 Liver cell carcinoma: Secondary | ICD-10-CM

## 2023-11-03 HISTORY — PX: IR RADIOLOGIST EVAL & MGMT: IMG5224

## 2023-11-03 NOTE — Consult Note (Signed)
Chief Complaint: Liver masses  Referring Physician(s): Rao,Archana C  Urology: Dr. Lonna Cobb  History of Present Illness: Luke Wall is a 77 y.o. male presenting to [facility] clinic as a scheduled consultation, kindly referred by Dr. Smith Robert of Oncology, for evaluation for liver directed therapy for liver lesions.   Luke Wall is here today with his wife, Bonita Quin, for the interview.   They tell me that the liver lesions were found incidentally on CT imaging (07/10/23) that was done for abd pain.  He was treated for nephrolithiasis, ultimately requiring stents, which were removed in the OR in October. His follow up visit for Urology noted a hernia, and he was referred to the ED 10/07/23 and then to [facility].    MRI was completed to evaluate the liver lesions, given his cirrhotic morphology.  The MRI 10/10/23 notes that he has 2 right side liver lesions, LIRADS-5 both.     AFP is 4.3 on 12/3.   He tells me that he did "have a good time" in his younger years.  He has given up drinking and smoking for many years.  He tells me he has received his Hep A/Hep B vaccines.    He is active at home, doing everything he needs to comfortably.  He works in the yard.  He is able to walk up and down stairs comfortably.  He says years ago he was told he had MI, but uncertain when.  He denies stroke or resting chest pain of concern. He is taking 81mg  ASA, lipid medication, HTN medication.  He is not diabetic.  Does not require home O2.   He says he has appointment for GI/upper endo December 8th.  He tells me that he has "completed" his colonoscopy" and was told he does not need them anymore.   He denies any episodes of ascites or variceal bleeding.   Na-MELD: 14  CP: 6, CP - A  Albumin: 3.1 INR: 1.3 WBC: 4.5 H/H: 13.4/39.2 Platelet: 71    Past Medical History:  Diagnosis Date   Aortic atherosclerosis (HCC)    Coronary artery disease    Full dentures    GERD (gastroesophageal reflux  disease)    Heart murmur    History of blood transfusion    age 43 after tonisillectomy   History of kidney stones    Hypertension    Myocardial infarction [facility])    Nuclear sclerotic cataract of right eye 01/31/2016   Osteoarthritis of left knee 09/16/2020   Right inguinal hernia    Splenomegaly    Thrombocytopenia (HCC)     Past Surgical History:  Procedure Laterality Date   CARDIAC CATHETERIZATION  1990's   POBA   CATARACT EXTRACTION W/ INTRAOCULAR LENS  IMPLANT, BILATERAL Bilateral 2019   COLONOSCOPY  2018   CYSTOSCOPY W/ URETERAL STENT REMOVAL Bilateral 08/25/2023   Procedure: CYSTOSCOPY WITH STENT REMOVAL;  Surgeon: Riki Altes, MD;  Location: [facility] ORS;  Service: Urology;  Laterality: Bilateral;   CYSTOSCOPY/URETEROSCOPY/HOLMIUM LASER/STENT PLACEMENT Bilateral 08/04/2023   Procedure: CYSTOSCOPY/URETEROSCOPY/HOLMIUM LASER/STENT PLACEMENT;  Surgeon: Riki Altes, MD;  Location: [facility] ORS;  Service: Urology;  Laterality: Bilateral;   KNEE ARTHROPLASTY Left 02/08/2021   Procedure: COMPUTER ASSISTED TOTAL KNEE ARTHROPLASTY - RNFA;  Surgeon: Donato Heinz, MD;  Location: [facility] ORS;  Service: Orthopedics;  Laterality: Left;   KNEE ARTHROPLASTY Right 06/17/2021   Procedure: COMPUTER ASSISTED TOTAL KNEE ARTHROPLASTY;  Surgeon: Donato Heinz, MD;  Location: [facility] ORS;  Service: Orthopedics;  Laterality: Right;   KNEE ARTHROSCOPY Left 1976   TONSILLECTOMY  1960    Allergies: Tramadol  Medications: Prior to Admission medications   Medication Sig Start Date End Date Taking? Authorizing Provider  acetaminophen (TYLENOL) 500 MG tablet Take 1,000 mg by mouth every 6 (six) hours as needed for moderate pain.    [provider]  albuterol (VENTOLIN HFA) 108 (90 Base) MCG/ACT inhaler Inhale into the lungs. 08/04/23   [provider]  aspirin EC 81 MG tablet Take 81 mg by mouth every morning. Swallow whole.    [provider]  atenolol (TENORMIN) 50 MG  tablet Take 50 mg by mouth every morning.    [provider]  HYDROcodone-acetaminophen (NORCO/VICODIN) 5-325 MG tablet Take 1 tablet by mouth every 6 (six) hours as needed for moderate pain (pain score 4-6). 09/10/23   Vaillancourt, Lelon Mast, PA-C  omeprazole (PRILOSEC) 20 MG capsule Take 20 mg by mouth every morning.    [provider]  rosuvastatin (CRESTOR) 10 MG tablet Take 1 tablet by mouth every morning. 07/08/23   [provider]  solifenacin (VESICARE) 10 MG tablet Take 10 mg by mouth daily. 08/31/23   [provider]  tamsulosin (FLOMAX) 0.4 MG CAPS capsule Take 1 capsule (0.4 mg total) by mouth daily after breakfast. 08/25/23   Stoioff, Verna Czech, MD     Family History  Family history unknown: Yes    Social History   Socioeconomic History   Marital status: Married    Spouse name: Bonita Quin   Number of children: Not on file   Years of education: Not on file   Highest education level: Not on file  Occupational History   Not on file  Tobacco Use   Smoking status: Former    Current packs/day: 0.50    Average packs/day: 0.5 packs/day for 10.9 years (5.5 ttl pk-yrs)    Types: Cigarettes    Start date: 2000    Passive exposure: Past   Smokeless tobacco: Never  Vaping Use   Vaping status: Never Used  Substance and Sexual Activity   Alcohol use: Not Currently   Drug use: Never   Sexual activity: Not on file  Other Topics Concern   Not on file  Social History Narrative   Not on file   Social Determinants of Health   Financial Resource Strain: Patient Declined (10/06/2023)   Received from [facility]   Overall Financial Resource Strain (CARDIA)    Difficulty of Paying Living Expenses: Patient declined  Food Insecurity: No Food Insecurity (10/27/2023)   Hunger Vital Sign    Worried About Running Out of Food in the Last Year: Never true    Ran Out of Food in the Last Year: Never true  Transportation Needs: No Transportation  Needs (10/27/2023)   PRAPARE - Administrator, Civil Service (Medical): No    Lack of Transportation (Non-Medical): No  Physical Activity: Not on file  Stress: Not on file  Social Connections: Not on file    ECOG Status: 0 - Asymptomatic  Review of Systems: A 12 point ROS discussed and pertinent positives are indicated in the HPI above.  All other systems are negative.  Review of Systems  Vital Signs: BP (!) 152/71 (BP Location: Left Arm, Patient Position: Sitting, Cuff Size: Normal)   Pulse (!) 52   Temp 97.9 F (36.6 C) (Oral)   Resp 14   SpO2 100%   Advance Care Plan: The advanced care plan/surrogate  decision maker was discussed at the time of visit and documented in the medical record.    Physical Exam General: 77 yo male appearing stated age.  Well-developed, well-nourished.  No distress. HEENT: Atraumatic, normocephalic.  Conjugate gaze, extra-ocular motor intact. No scleral icterus or scleral injection. No lesions on external ears, nose, lips, or gums.  Oral mucosa moist, pink.  Neck: Symmetric with no goiter enlargement.  Chest/Lungs:  Symmetric chest with inspiration/expiration.  No labored breathing.  Clear to auscultation with no wheezes, rhonchi, or rales.  Heart:  RRR, with no third heart sounds appreciated. No JVD appreciated.  Abdomen:  Soft, NT/ND, with + bowel sounds.   Genito-urinary: Deferred Neurologic: Alert & Oriented to person, place, and time.   Normal affect and insight.  Appropriate questions.  Moving all 4 extremities with gross sensory intact.  Extremities:  No swelling.     Mallampati Score:     Imaging: Abdomen 1 view (KUB)  Result Date: 10/23/2023 CLINICAL DATA:  Ureteral stone. EXAM: ABDOMEN - 1 VIEW COMPARISON:  09/10/2023. FINDINGS: The bowel gas pattern is normal. Calcifications overlying both kidneys consistent with stones. IMPRESSION: Bilateral nephrolithiasis. Electronically Signed   By: Layla Maw M.D.   On:  10/23/2023 17:08   Luke LIVER W WO CONTRAST  Result Date: 10/20/2023 CLINICAL DATA:  Follow-up abnormal CT scan suggesting cirrhosis and indeterminate hepatic lesion. EXAM: MRI ABDOMEN WITHOUT AND WITH CONTRAST TECHNIQUE: Multiplanar multisequence Luke imaging of the abdomen was performed both before and after the administration of intravenous contrast. CONTRAST:  9mL GADAVIST GADOBUTROL 1 MMOL/ML IV SOLN COMPARISON:  CT scan 07/10/2023 FINDINGS: Lower chest: The lung bases are clear of an acute process. No pulmonary lesions, pleural or pericardial effusion. Hepatobiliary: Cirrhotic changes involving the liver with hepatic contour irregularity, dilated hepatic fissures and increased caudate to right lobe ratio. There is also portal venous hypertension, portal venous collaterals and splenomegaly and ascites. The gallbladder is unremarkable.  No biliary dilatation. There is a 2 point by 2.0 cm early arterial phase enhancing lesion in the anterior aspect of segment 6 best seen on image 24 of series 14. This lesion is very difficult to see on the T2 weighted images. It demonstrates very slight increased signal intensity. The lesion is weakly diffusion positive. It also demonstrates near complete washout and subsequent thin capsular enhancement. This is considered a LI-RADS 5. There is a second early arterial phase enhancing lesion in segment 6 posteriorly best seen the same image 24 of series 14. This also demonstrates washout but no definite rim like capsular enhancement. The lesion is also faintly positive on the precontrast sequence. This lesion measures 2.3 x 2.2 cm. This is also considered a LI-RADS 5. No other hepatic lesions are identified. Pancreas: Moderate pancreatic atrophy but no mass or inflammation or ductal dilatation. Spleen: Moderate splenomegaly. The spleen measures 14.5 x 12.0 x 7.7 cm. No splenic lesions. Adrenals/Urinary Tract: The adrenal glands and kidneys are unremarkable. Stomach/Bowel:  Stomach, duodenum, visualized small bowel and visualized colon unremarkable. Vascular/Lymphatic: The major vascular structures are patent. No abdominal lymphadenopathy. Other:  Small volume abdominal ascites and mesenteric edema. Musculoskeletal: No significant bony findings. IMPRESSION: 1. Cirrhotic changes involving the liver with portal venous hypertension, portal venous collaterals, splenomegaly and ascites. 2. Two early arterial phase enhancing lesions in segment 6 of the liver, both considered LI-RADS 5 lesions. 3. No abdominal lymphadenopathy. Electronically Signed   By: Rudie Meyer M.D.   On: 10/20/2023 15:33    Labs:  CBC: Recent  Labs    08/04/23 1404 08/24/23 0915 10/27/23 1251  WBC 4.6 5.3 4.5  HGB 13.4 14.3 13.4  HCT 39.2 39.9 39.2  PLT 59* 88* 71*    COAGS: Recent Labs    10/27/23 1251  INR 1.3*    BMP: Recent Labs    08/04/23 1404 10/27/23 1251  NA 141 137  K 4.1 4.1  CL 108 108  CO2 25 19*  GLUCOSE 97 100*  BUN 14 16  CALCIUM 9.0 9.0  CREATININE 1.07 0.97  GFRNONAA >60 >60    LIVER FUNCTION TESTS: Recent Labs    10/27/23 1251  BILITOT 1.7*  AST 44*  ALT 27  ALKPHOS 57  PROT 6.5  ALBUMIN 3.1*    TUMOR MARKERS: No results for input(s): "AFPTM", "CEA", "CA199", "CHROMGRNA" in the last 8760 hours.  Assessment and Plan:  77 yo male with new diagnosis of liver lesions on imaging, which are both ~2cm and LIRADS - 5 on MRI, definitely HCC.   I had a lengthy discussion with Luke Ivey and his wife, Bonita Quin, regarding cirrhosis, the natural history of HCC in this setting, his risk factor for cirrhosis (ETOH), and the treatment options for [facility].   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  Early/Initial stage.    While liver transplant or hepatectomy remains gold standard, his case has been reviewed, and he has been referred for ablation options.  I think he is an excellent candidate for ablation, given the size and location of  these lesions.    I further discussed with him the concept of ablation, which is targeting the lesions with percutaneous approach, under GETA, with thermal ablation.    Risks: bleeding, infection, need for additional treatment/surgery, risks of anesthesia, tumor lysis syndrome, organ failure, hospitalization, progression/recurrence, cardiopulmonary collapse, death.    After our discussion, he and his wife would like to proceed.    2 separate LIRADS 5 liver lesions - Definitely HCC - recommend treating, and he is excellent candidate for CT guided ablation with microwave  Compensated Cirrhosis - pending upper GI within the week for varices - he is on atelolol.  He might benefit from change to non-selective beta-blocker, as he remains hypertensive  Thrombocytopenia - cirrhosis/portal HTN - I think we can work around this   Plan: - We will set him up for CT guided liver lesion ablation ( 2 separate lesion/HCC, treated same session) with GETA at [facility], with Dr. Loreta Ave, microwave technology.  He will need to hold the aspirin 5 days before we treat him - Pre-anesthesia clearance.  - continue current care  Thank you for this interesting consult.  I greatly enjoyed meeting DIMITRIOUS TUNIS and look forward to participating in their care.  A copy of this report was sent to the requesting provider on this date.  Electronically Signed: Gilmer Mor 11/03/2023, 9:38 AM   I spent a total of  60 Minutes   in face to face in clinical consultation, greater than 50% of which was counseling/coordinating care for 2 HCC lesions, possible image guided ablation

## 2023-11-04 ENCOUNTER — Ambulatory Visit
Admission: RE | Admit: 2023-11-04 | Discharge: 2023-11-04 | Disposition: A | Discharge status: Home / Self Care | Payer: Medicare HMO | Source: Ambulatory Visit | Attending: Oncology | Admitting: Oncology

## 2023-11-04 DIAGNOSIS — C220 Liver cell carcinoma: Secondary | ICD-10-CM | POA: Diagnosis not present

## 2023-11-04 DIAGNOSIS — R162 Hepatomegaly with splenomegaly, not elsewhere classified: Secondary | ICD-10-CM | POA: Diagnosis not present

## 2023-11-04 DIAGNOSIS — R590 Localized enlarged lymph nodes: Secondary | ICD-10-CM | POA: Diagnosis not present

## 2023-11-04 DIAGNOSIS — I10 Essential (primary) hypertension: Secondary | ICD-10-CM | POA: Diagnosis not present

## 2023-11-04 DIAGNOSIS — R079 Chest pain, unspecified: Secondary | ICD-10-CM | POA: Diagnosis not present

## 2023-11-04 DIAGNOSIS — I25119 Atherosclerotic heart disease of native coronary artery with unspecified angina pectoris: Secondary | ICD-10-CM | POA: Diagnosis not present

## 2023-11-04 DIAGNOSIS — I77810 Thoracic aortic ectasia: Secondary | ICD-10-CM | POA: Diagnosis not present

## 2023-11-06 DIAGNOSIS — C220 Liver cell carcinoma: Secondary | ICD-10-CM | POA: Diagnosis not present

## 2023-11-06 DIAGNOSIS — J069 Acute upper respiratory infection, unspecified: Secondary | ICD-10-CM | POA: Diagnosis not present

## 2023-11-06 DIAGNOSIS — I2510 Atherosclerotic heart disease of native coronary artery without angina pectoris: Secondary | ICD-10-CM | POA: Diagnosis not present

## 2023-11-06 DIAGNOSIS — K7460 Unspecified cirrhosis of liver: Secondary | ICD-10-CM | POA: Diagnosis not present

## 2023-11-06 DIAGNOSIS — I10 Essential (primary) hypertension: Secondary | ICD-10-CM | POA: Diagnosis not present

## 2023-11-10 ENCOUNTER — Encounter: Payer: Self-pay | Admitting: Oncology

## 2023-11-10 ENCOUNTER — Inpatient Hospital Stay: Payer: Medicare HMO | Admitting: Oncology

## 2023-11-10 VITALS — BP 106/68 | HR 52 | Temp 99.9°F | Resp 18 | Wt 184.2 lb

## 2023-11-10 DIAGNOSIS — K4090 Unilateral inguinal hernia, without obstruction or gangrene, not specified as recurrent: Secondary | ICD-10-CM | POA: Diagnosis not present

## 2023-11-10 DIAGNOSIS — R0789 Other chest pain: Secondary | ICD-10-CM | POA: Diagnosis not present

## 2023-11-10 DIAGNOSIS — C220 Liver cell carcinoma: Secondary | ICD-10-CM | POA: Diagnosis not present

## 2023-11-13 DIAGNOSIS — C220 Liver cell carcinoma: Secondary | ICD-10-CM | POA: Insufficient documentation

## 2023-11-13 NOTE — Progress Notes (Signed)
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 Tonna Boehringer for unilateral right inguinal hernia. He underwent CT abdomen which showed possible cirrhosis as well as indeterminate 2.2 cm lesion in the right hepatic lobe along with bilateral kidney stones with mild right hydronephrosis and proximal hydroureter and mild splenomegaly. This was followed by MRI liver with and without contrast which showed cirrhotic changes in the liver with portal venous hypertension, portal venous collaterals splenomegaly and ascites. He was noted to have 2 lesions 1 involving the anterior aspect of segment 6 measuring 2 cm characterized as LI-RADS 5. There was another 2.3 x 2.2 cm lesion in segment 6 which was also characterized as LI-RADS 5.   Case discussed at multidisciplinary tumor board and he would be a candidate for liver directed therapy.  He has also been referred for transplant evaluation.  Given his cirrhosis portal hypertension thrombocytopenia and ascites he is unlikely to be a candidate for definitive resection.  He was also evaluated by [facility] to workup his cirrhosis  Interval history-he continues to have mild pain at the site of his inguinal hernia.  Also has occasional abdominal pressure and pain especially when he tries to bend down  ECOG PS- 1 Pain scale- 3   Review of systems- Review of Systems  Constitutional:  Positive for malaise/fatigue. Negative for chills,  fever and weight loss.  HENT:  Negative for congestion, ear discharge and nosebleeds.   Eyes:  Negative for blurred vision.  Respiratory:  Negative for cough, hemoptysis, sputum production, shortness of breath and wheezing.   Cardiovascular:  Negative for chest pain, palpitations, orthopnea and claudication.  Gastrointestinal:  Positive for abdominal pain. Negative for blood in stool, constipation, diarrhea, heartburn, melena, nausea and vomiting.  Genitourinary:  Negative for dysuria, flank pain, frequency, hematuria and urgency.  Musculoskeletal:  Negative for back pain, joint pain and myalgias.  Skin:  Negative for rash.  Neurological:  Negative for dizziness, tingling, focal weakness, seizures, weakness and headaches.  Endo/Heme/Allergies:  Does not bruise/bleed easily.  Psychiatric/Behavioral:  Negative for depression and suicidal ideas. The patient does not have insomnia.       Allergies  Allergen Reactions   Tramadol Other (See Comments)    Gi distress     Past Medical History:  Diagnosis Date   Aortic atherosclerosis (HCC)    Coronary artery disease    Full dentures    GERD (gastroesophageal reflux disease)    Heart murmur    History of blood transfusion    age 73 after tonisillectomy   History of kidney stones    Hypertension    Myocardial infarction [facility])    Nuclear sclerotic cataract of right eye 01/31/2016   Osteoarthritis of left knee 09/16/2020   Right inguinal hernia    Splenomegaly    Thrombocytopenia (HCC)      Past Surgical History:  Procedure Laterality Date   CARDIAC CATHETERIZATION  1990's   POBA   CATARACT EXTRACTION W/ INTRAOCULAR LENS  IMPLANT, BILATERAL Bilateral 2019  COLONOSCOPY  2018   CYSTOSCOPY W/ URETERAL STENT REMOVAL Bilateral 08/25/2023   Procedure: CYSTOSCOPY WITH STENT REMOVAL;  Surgeon: Riki Altes, MD;  Location: [facility] ORS;  Service: Urology;  Laterality: Bilateral;   CYSTOSCOPY/URETEROSCOPY/HOLMIUM LASER/STENT PLACEMENT  Bilateral 08/04/2023   Procedure: CYSTOSCOPY/URETEROSCOPY/HOLMIUM LASER/STENT PLACEMENT;  Surgeon: Riki Altes, MD;  Location: [facility] ORS;  Service: Urology;  Laterality: Bilateral;   IR RADIOLOGIST EVAL & MGMT  11/03/2023   KNEE ARTHROPLASTY Left 02/08/2021   Procedure: COMPUTER ASSISTED TOTAL KNEE ARTHROPLASTY - RNFA;  Surgeon: Donato Heinz, MD;  Location: [facility] ORS;  Service: Orthopedics;  Laterality: Left;   KNEE ARTHROPLASTY Right 06/17/2021   Procedure: COMPUTER ASSISTED TOTAL KNEE ARTHROPLASTY;  Surgeon: Donato Heinz, MD;  Location: [facility] ORS;  Service: Orthopedics;  Laterality: Right;   KNEE ARTHROSCOPY Left 1976   TONSILLECTOMY  1960    Social History   Socioeconomic History   Marital status: Married    Spouse name: Bonita Quin   Number of children: Not on file   Years of education: Not on file   Highest education level: Not on file  Occupational History   Not on file  Tobacco Use   Smoking status: Former    Current packs/day: 0.50    Average packs/day: 0.5 packs/day for 11.0 years (5.5 ttl pk-yrs)    Types: Cigarettes    Start date: 2000    Passive exposure: Past   Smokeless tobacco: Never  Vaping Use   Vaping status: Never Used  Substance and Sexual Activity   Alcohol use: Not Currently   Drug use: Never   Sexual activity: Not on file  Other Topics Concern   Not on file  Social History Narrative   Not on file   Social Drivers of Health   Financial Resource Strain: Patient Declined (10/06/2023)   Received from [facility]   Overall Financial Resource Strain (CARDIA)    Difficulty of Paying Living Expenses: Patient declined  Food Insecurity: No Food Insecurity (10/27/2023)   Hunger Vital Sign    Worried About Running Out of Food in the Last Year: Never true    Ran Out of Food in the Last Year: Never true  Transportation Needs: No Transportation Needs (10/27/2023)   PRAPARE - Administrator, Civil Service (Medical): No    Lack of  Transportation (Non-Medical): No  Physical Activity: Not on file  Stress: Not on file  Social Connections: Not on file  Intimate Partner Violence: Not At Risk (10/27/2023)   Humiliation, Afraid, Rape, and Kick questionnaire    Fear of Current or Ex-Partner: No    Emotionally Abused: No    Physically Abused: No    Sexually Abused: No    Family History  Family history unknown: Yes     Current Outpatient Medications:    diazepam (VALIUM) 5 MG tablet, , Disp: , Rfl:    predniSONE (DELTASONE) 10 MG tablet, Take by mouth., Disp: , Rfl:    promethazine-dextromethorphan (PROMETHAZINE-DM) 6.25-15 MG/5ML syrup, Take by mouth., Disp: , Rfl:    acetaminophen (TYLENOL) 500 MG tablet, Take 1,000 mg by mouth every 6 (six) hours as needed for moderate pain., Disp: , Rfl:    albuterol (VENTOLIN HFA) 108 (90 Base) MCG/ACT inhaler, Inhale into the lungs., Disp: , Rfl:    aspirin EC 81 MG tablet, Take 81 mg by mouth every morning. Swallow whole., Disp: , Rfl:    atenolol (TENORMIN) 50 MG tablet, Take 50 mg by mouth every  morning., Disp: , Rfl:    HYDROcodone-acetaminophen (NORCO/VICODIN) 5-325 MG tablet, Take 1 tablet by mouth every 6 (six) hours as needed for moderate pain (pain score 4-6)., Disp: 6 tablet, Rfl: 0   omeprazole (PRILOSEC) 20 MG capsule, Take 20 mg by mouth every morning., Disp: , Rfl:    rosuvastatin (CRESTOR) 10 MG tablet, Take 1 tablet by mouth every morning., Disp: , Rfl:    solifenacin (VESICARE) 10 MG tablet, Take 10 mg by mouth daily., Disp: , Rfl:    tamsulosin (FLOMAX) 0.4 MG CAPS capsule, Take 1 capsule (0.4 mg total) by mouth daily after breakfast., Disp: 30 capsule, Rfl: 0  Physical exam:  Vitals:   11/10/23 1317  BP: 106/68  Pulse: (!) 52  Resp: 18  Temp: 99.9 F (37.7 C)  TempSrc: Tympanic  SpO2: 97%  Weight: 184 lb 3.2 oz (83.6 kg)   Physical Exam Cardiovascular:     Rate and Rhythm: Normal rate and regular rhythm.     Heart sounds: Normal heart sounds.   Pulmonary:     Effort: Pulmonary effort is normal.     Breath sounds: Normal breath sounds.  Abdominal:     General: Bowel sounds are normal.     Palpations: Abdomen is soft.  Skin:    General: Skin is warm and dry.  Neurological:     Mental Status: He is alert and oriented to person, place, and time.         Latest Ref Rng & Units 10/27/2023   12:51 PM  CMP  Glucose 70 - 99 mg/dL 119   BUN 8 - 23 mg/dL 16   Creatinine 1.47 - 1.24 mg/dL 8.29   Sodium 562 - 130 mmol/L 137   Potassium 3.5 - 5.1 mmol/L 4.1   Chloride 98 - 111 mmol/L 108   CO2 22 - 32 mmol/L 19   Calcium 8.9 - 10.3 mg/dL 9.0   Total Protein 6.5 - 8.1 g/dL 6.5   Total Bilirubin <8.6 mg/dL 1.7   Alkaline Phos 38 - 126 U/L 57   AST 15 - 41 U/L 44   ALT 0 - 44 U/L 27       Latest Ref Rng & Units 10/27/2023   12:51 PM  CBC  WBC 4.0 - 10.5 K/uL 4.5   Hemoglobin 13.0 - 17.0 g/dL 57.8   Hematocrit 46.9 - 52.0 % 39.2   Platelets 150 - 400 K/uL 71     No images are attached to the encounter.  IR Radiologist Eval & Mgmt Result Date: 11/03/2023 EXAM: NEW PATIENT OFFICE VISIT CHIEF COMPLAINT: Electronic medical record HISTORY OF PRESENT ILLNESS: Electronic medical record REVIEW OF SYSTEMS: Electronic medical record PHYSICAL EXAMINATION: Electronic medical record ASSESSMENT AND PLAN: Electronic medical record Electronically Signed   By: Gilmer Mor D.O.   On: 11/03/2023 10:11   MR LIVER W WO CONTRAST Result Date: 10/20/2023 CLINICAL DATA:  Follow-up abnormal CT scan suggesting cirrhosis and indeterminate hepatic lesion. EXAM: MRI ABDOMEN WITHOUT AND WITH CONTRAST TECHNIQUE: Multiplanar multisequence MR imaging of the abdomen was performed both before and after the administration of intravenous contrast. CONTRAST:  9mL GADAVIST GADOBUTROL 1 MMOL/ML IV SOLN COMPARISON:  CT scan 07/10/2023 FINDINGS: Lower chest: The lung bases are clear of an acute process. No pulmonary lesions, pleural or pericardial effusion.  Hepatobiliary: Cirrhotic changes involving the liver with hepatic contour irregularity, dilated hepatic fissures and increased caudate to right lobe ratio. There is also portal venous hypertension, portal venous collaterals and splenomegaly  and ascites. The gallbladder is unremarkable.  No biliary dilatation. There is a 2 point by 2.0 cm early arterial phase enhancing lesion in the anterior aspect of segment 6 best seen on image 24 of series 14. This lesion is very difficult to see on the T2 weighted images. It demonstrates very slight increased signal intensity. The lesion is weakly diffusion positive. It also demonstrates near complete washout and subsequent thin capsular enhancement. This is considered a LI-RADS 5. There is a second early arterial phase enhancing lesion in segment 6 posteriorly best seen the same image 24 of series 14. This also demonstrates washout but no definite rim like capsular enhancement. The lesion is also faintly positive on the precontrast sequence. This lesion measures 2.3 x 2.2 cm. This is also considered a LI-RADS 5. No other hepatic lesions are identified. Pancreas: Moderate pancreatic atrophy but no mass or inflammation or ductal dilatation. Spleen: Moderate splenomegaly. The spleen measures 14.5 x 12.0 x 7.7 cm. No splenic lesions. Adrenals/Urinary Tract: The adrenal glands and kidneys are unremarkable. Stomach/Bowel: Stomach, duodenum, visualized small bowel and visualized colon unremarkable. Vascular/Lymphatic: The major vascular structures are patent. No abdominal lymphadenopathy. Other:  Small volume abdominal ascites and mesenteric edema. Musculoskeletal: No significant bony findings. IMPRESSION: 1. Cirrhotic changes involving the liver with portal venous hypertension, portal venous collaterals, splenomegaly and ascites. 2. Two early arterial phase enhancing lesions in segment 6 of the liver, both considered LI-RADS 5 lesions. 3. No abdominal lymphadenopathy. Electronically  Signed   By: Rudie Meyer M.D.   On: 10/20/2023 15:33     Assessment and plan- Patient is a 77 y.o. male with new diagnosis of hepatocellular carcinoma here to discuss further management  MRI abdomen showed 2 distinct 2 cm lesions which were characterized as LI-RADS 5 which would be diagnostic of hepatocellular carcinoma.  I have reviewed CT chest images independently and discussed findings with the patient which does not show any evidence of thoracic metastatic disease but official results are still pending.  Patient has been evaluated by Dr. Loreta Ave from interventional radiology and he would be a candidate for CT-guided microwave ablation.  He is awaiting formal transplant opinion from [facility] he has an appointment coming up next month.  Following that appointment he would be able to move forward with microwave ablation.  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 Hepatocellular carcinoma (HCC)      Dr. Owens Shark, MD, MPH [facility] at [facility] [phone] 11/13/2023 8:50 AM

## 2023-11-24 ENCOUNTER — Other Ambulatory Visit: Payer: Self-pay | Admitting: *Deleted

## 2023-11-24 DIAGNOSIS — N201 Calculus of ureter: Secondary | ICD-10-CM

## 2023-12-02 ENCOUNTER — Ambulatory Visit: Payer: Medicare HMO | Admitting: Urology

## 2023-12-03 ENCOUNTER — Other Ambulatory Visit: Payer: Self-pay | Admitting: Interventional Radiology

## 2023-12-03 DIAGNOSIS — C220 Liver cell carcinoma: Secondary | ICD-10-CM

## 2023-12-11 ENCOUNTER — Encounter: Payer: Self-pay | Admitting: Oncology

## 2023-12-16 ENCOUNTER — Ambulatory Visit: Payer: Medicare HMO | Admitting: Urology

## 2023-12-23 ENCOUNTER — Other Ambulatory Visit: Payer: Self-pay

## 2023-12-23 ENCOUNTER — Encounter
Admission: RE | Admit: 2023-12-23 | Discharge: 2023-12-23 | Disposition: A | Discharge status: Home / Self Care | Payer: Medicare HMO | Source: Ambulatory Visit | Attending: Interventional Radiology | Admitting: Interventional Radiology

## 2023-12-23 ENCOUNTER — Other Ambulatory Visit: Payer: Self-pay | Admitting: Radiology

## 2023-12-23 DIAGNOSIS — K769 Liver disease, unspecified: Secondary | ICD-10-CM

## 2023-12-23 HISTORY — DX: Malignant (primary) neoplasm, unspecified: C80.1

## 2023-12-23 NOTE — Patient Instructions (Addendum)
Your procedure is scheduled on:  Tuesday, February 11 Report to the Registration Desk on the 1st floor of the [facility] at 7:30 am. Bonita Quin will be directed to the Radiology Department.  REMEMBER: Instructions that are not followed completely may result in serious medical risk, up to and including death; or upon the discretion of your surgeon and anesthesiologist your surgery may need to be rescheduled.  Do not eat food after midnight the night before surgery.  No gum chewing or hard candies.   One week prior to surgery: Tuesday, February 4  Stop Anti-inflammatories (NSAIDS) such as Advil, Aleve, Ibuprofen, Motrin, Naproxen, Naprosyn and Aspirin based products such as Excedrin, Goody's Powder, BC Powder. Stop ANY OVER THE COUNTER supplements until after surgery.  You may however, continue to take Tylenol if needed for pain up until the day of surgery.  **Follow recommendations regarding stopping blood thinners.** aspirin EC 81 hold 5 days prior to procedure, last dose Wednesday, February 5   Continue taking all of your other prescription medications up until the day of surgery.  ON THE DAY OF SURGERY ONLY TAKE THESE MEDICATIONS WITH SIPS OF WATER:  atenolol (TENORMIN)  omeprazole (PRILOSEC)  rosuvastatin (CRESTOR)  tamsulosin (FLOMAX)   Use inhalers on the day of surgery and bring to the hospital. albuterol (VENTOLIN HFA)   No Alcohol for 24 hours before or after surgery.  No Smoking including e-cigarettes for 24 hours before surgery.  No chewable tobacco products for at least 6 hours before surgery.  No nicotine patches on the day of surgery.  Do not use any "recreational" drugs for at least a week (preferably 2 weeks) before your surgery.  Please be advised that the combination of cocaine and anesthesia may have negative outcomes, up to and including death. If you test positive for cocaine, your surgery will be cancelled.  On the morning of surgery brush your teeth with  toothpaste and water, you may rinse your mouth with mouthwash if you wish. Do not swallow any toothpaste or mouthwash.  Do not wear jewelry, make-up, hairpins, clips or nail polish.  For welded (permanent) jewelry: bracelets, anklets, waist bands, etc.  Please have this removed prior to surgery.  If it is not removed, there is a chance that hospital personnel will need to cut it off on the day of surgery.  Do not wear lotions, powders, or perfumes.   Do not shave body hair from the neck down 48 hours before surgery.  Contact lenses, hearing aids and dentures may not be worn into surgery.  Do not bring valuables to the hospital. [facility] is not responsible for any missing/lost belongings or valuables.   Notify your doctor if there is any change in your medical condition (cold, fever, infection).  Wear comfortable clothing (specific to your surgery type) to the hospital.   If you are being discharged the day of surgery, you will not be allowed to drive home. You will need a responsible individual to drive you home and stay with you for 24 hours after surgery.   If you are taking public transportation, you will need to have a responsible individual with you.  Please call the Pre-admissions Testing Dept. at [phone] if you have any questions about these instructions.  Surgery Visitation Policy:  Patients having surgery or a procedure may have two visitors.  Children under the age of 50 must have an adult with them who is not the patient.  Temporary Visitor Restrictions Due to increasing cases  of flu, RSV and COVID-19: Children ages 55 and under will not be able to visit patients in [facility] hospitals under most circumstances.

## 2023-12-24 ENCOUNTER — Encounter
Admission: RE | Admit: 2023-12-24 | Discharge: 2023-12-24 | Disposition: A | Discharge status: Home / Self Care | Payer: Medicare HMO | Source: Ambulatory Visit | Attending: Interventional Radiology | Admitting: Interventional Radiology

## 2023-12-24 DIAGNOSIS — K769 Liver disease, unspecified: Secondary | ICD-10-CM | POA: Insufficient documentation

## 2023-12-24 DIAGNOSIS — K7460 Unspecified cirrhosis of liver: Secondary | ICD-10-CM | POA: Insufficient documentation

## 2023-12-24 DIAGNOSIS — Z01818 Encounter for other preprocedural examination: Secondary | ICD-10-CM | POA: Insufficient documentation

## 2023-12-24 DIAGNOSIS — D696 Thrombocytopenia, unspecified: Secondary | ICD-10-CM | POA: Diagnosis not present

## 2023-12-24 DIAGNOSIS — Z01812 Encounter for preprocedural laboratory examination: Secondary | ICD-10-CM | POA: Diagnosis present

## 2023-12-24 DIAGNOSIS — Z01810 Encounter for preprocedural cardiovascular examination: Secondary | ICD-10-CM | POA: Diagnosis present

## 2023-12-24 DIAGNOSIS — C220 Liver cell carcinoma: Secondary | ICD-10-CM | POA: Insufficient documentation

## 2023-12-24 DIAGNOSIS — R9431 Abnormal electrocardiogram [ECG] [EKG]: Secondary | ICD-10-CM | POA: Insufficient documentation

## 2023-12-24 DIAGNOSIS — R161 Splenomegaly, not elsewhere classified: Secondary | ICD-10-CM | POA: Diagnosis not present

## 2023-12-24 LAB — CBC WITH DIFFERENTIAL/PLATELET
Abs Immature Granulocytes: 0.01 10*3/uL (ref 0.00–0.07)
Basophils Absolute: 0 10*3/uL (ref 0.0–0.1)
Basophils Relative: 1 %
Eosinophils Absolute: 0.1 10*3/uL (ref 0.0–0.5)
Eosinophils Relative: 2 %
HCT: 41.1 % (ref 39.0–52.0)
Hemoglobin: 14.2 g/dL (ref 13.0–17.0)
Immature Granulocytes: 0 %
Lymphocytes Relative: 27 %
Lymphs Abs: 1.1 10*3/uL (ref 0.7–4.0)
MCH: 32.5 pg (ref 26.0–34.0)
MCHC: 34.5 g/dL (ref 30.0–36.0)
MCV: 94.1 fL (ref 80.0–100.0)
Monocytes Absolute: 0.4 10*3/uL (ref 0.1–1.0)
Monocytes Relative: 11 %
Neutro Abs: 2.4 10*3/uL (ref 1.7–7.7)
Neutrophils Relative %: 59 %
Platelets: 77 10*3/uL — ABNORMAL LOW (ref 150–400)
RBC: 4.37 MIL/uL (ref 4.22–5.81)
RDW: 13.7 % (ref 11.5–15.5)
Smear Review: NORMAL
WBC: 4 10*3/uL (ref 4.0–10.5)
nRBC: 0 % (ref 0.0–0.2)

## 2023-12-24 LAB — COMPREHENSIVE METABOLIC PANEL
ALT: 26 U/L (ref 0–44)
AST: 40 U/L (ref 15–41)
Albumin: 3.4 g/dL — ABNORMAL LOW (ref 3.5–5.0)
Alkaline Phosphatase: 57 U/L (ref 38–126)
Anion gap: 9 (ref 5–15)
BUN: 15 mg/dL (ref 8–23)
CO2: 24 mmol/L (ref 22–32)
Calcium: 9.1 mg/dL (ref 8.9–10.3)
Chloride: 109 mmol/L (ref 98–111)
Creatinine, Ser: 1.04 mg/dL (ref 0.61–1.24)
GFR, Estimated: >60 mL/min (ref >60)
Glucose, Bld: 141 mg/dL — ABNORMAL HIGH (ref 70–99)
Potassium: 3.5 mmol/L (ref 3.5–5.1)
Sodium: 142 mmol/L (ref 135–145)
Total Bilirubin: 2.3 mg/dL — ABNORMAL HIGH (ref 0.0–1.2)
Total Protein: 6.9 g/dL (ref 6.5–8.1)

## 2023-12-24 LAB — PROTIME-INR
INR: 1.2 (ref 0.8–1.2)
Prothrombin Time: 15.2 s (ref 11.4–15.2)

## 2023-12-25 NOTE — Progress Notes (Signed)
  [facility] Perioperative Services: Pre-Admission/Anesthesia Testing  Abnormal Lab Notification   Date: 12/25/23  Name: Luke Wall MRN:   [id number]  Re: Abnormal labs noted during PAT appointment   Notified:    Provider Name Provider Role Notification Mode  Gilmer Mor, DO Interventional Radiology Routed and/or faxed via [facility]   ABNORMAL LAB VALUE(S):   Lab Results  Component Value Date   PLT 77 (L) 12/24/2023   Clinical Information and Notes:  [name] is scheduled for a CT BONE TUMOR(S)RF ABLATION  on 01/05/2024  Pre-procedural labs reveal platelet count of 77 K/uL. Patient with a history of documented thrombocytopenia, with platelet levels ranging between 59-116 over the course of the last 2 years. Platelets with significant drop over a period of the last 4 months, with the lowest being 59 K/uL back in 07/2023.   Factors contributing to patient's thrombocytopenia include:  Stage II hepatocellular carcinoma (T2 N0 M0) diagnosis. He is under the care of Dr. Owens Shark, MD  here at [facility].   Compensated cirrhosis.    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 However with that said, every provider has their own threshold of comfort with regards to procedures being performed in patients with blood dyscrasias. I am unsure of the bleeding risk associated with this particular procedure. Sending result to proceduralist as an FYI for review prior to upcoming procedure.  Quentin Mulling, MSN, APRN, FNP-C, CEN [facility]  Perioperative Services Nurse Practitioner Phone: [phone] Fax: [phone] 12/25/23 3:08 PM

## 2023-12-29 ENCOUNTER — Encounter: Payer: Self-pay | Admitting: Internal Medicine

## 2023-12-30 ENCOUNTER — Ambulatory Visit
Admission: RE | Admit: 2023-12-30 | Discharge: 2023-12-30 | Disposition: A | Discharge status: Home / Self Care | Payer: Medicare HMO | Attending: Internal Medicine | Admitting: Internal Medicine

## 2023-12-30 ENCOUNTER — Encounter
Admission: RE | Disposition: A | Discharge status: Home / Self Care | Payer: Self-pay | Source: Home / Self Care | Attending: Internal Medicine

## 2023-12-30 ENCOUNTER — Ambulatory Visit: Payer: Medicare HMO | Admitting: Certified Registered"

## 2023-12-30 ENCOUNTER — Ambulatory Visit: Payer: Self-pay | Admitting: Urgent Care

## 2023-12-30 DIAGNOSIS — R161 Splenomegaly, not elsewhere classified: Secondary | ICD-10-CM | POA: Diagnosis not present

## 2023-12-30 DIAGNOSIS — K766 Portal hypertension: Secondary | ICD-10-CM | POA: Insufficient documentation

## 2023-12-30 DIAGNOSIS — F1011 Alcohol abuse, in remission: Secondary | ICD-10-CM | POA: Diagnosis not present

## 2023-12-30 DIAGNOSIS — I8510 Secondary esophageal varices without bleeding: Secondary | ICD-10-CM | POA: Diagnosis not present

## 2023-12-30 DIAGNOSIS — K3189 Other diseases of stomach and duodenum: Secondary | ICD-10-CM | POA: Diagnosis not present

## 2023-12-30 DIAGNOSIS — K219 Gastro-esophageal reflux disease without esophagitis: Secondary | ICD-10-CM | POA: Insufficient documentation

## 2023-12-30 DIAGNOSIS — C220 Liver cell carcinoma: Secondary | ICD-10-CM | POA: Diagnosis not present

## 2023-12-30 DIAGNOSIS — I2510 Atherosclerotic heart disease of native coronary artery without angina pectoris: Secondary | ICD-10-CM | POA: Diagnosis not present

## 2023-12-30 DIAGNOSIS — Z87891 Personal history of nicotine dependence: Secondary | ICD-10-CM | POA: Insufficient documentation

## 2023-12-30 DIAGNOSIS — I10 Essential (primary) hypertension: Secondary | ICD-10-CM | POA: Insufficient documentation

## 2023-12-30 DIAGNOSIS — K7460 Unspecified cirrhosis of liver: Secondary | ICD-10-CM | POA: Diagnosis present

## 2023-12-30 HISTORY — PX: ESOPHAGOGASTRODUODENOSCOPY (EGD) WITH PROPOFOL: SHX5813

## 2023-12-30 SURGERY — ESOPHAGOGASTRODUODENOSCOPY (EGD) WITH PROPOFOL
Anesthesia: General

## 2023-12-30 MED ORDER — SODIUM CHLORIDE 0.9 % IV SOLN
INTRAVENOUS | Status: DC
Start: 2023-12-30 — End: 2023-12-30
  Administered 2023-12-30: 20 mL/h via INTRAVENOUS

## 2023-12-30 MED ORDER — LIDOCAINE HCL (CARDIAC) PF 100 MG/5ML IV SOSY
PREFILLED_SYRINGE | INTRAVENOUS | Status: DC | PRN
  Administered 2023-12-30 (×3): 100 mg via INTRAVENOUS

## 2023-12-30 MED ORDER — PROPOFOL 500 MG/50ML IV EMUL
INTRAVENOUS | Status: DC | PRN
  Administered 2023-12-30 (×2): 50 mg via INTRAVENOUS

## 2023-12-30 NOTE — H&P (Signed)
 Outpatient short stay form Pre-procedure 12/30/2023 11:31 AM Luke Wall, M.D.  Primary Physician: Lynwood Null, M.D.  Reason for visit:  Portal hypertension, cirrhosis, Hepatocellular cancer  History of present illness:  Luke Wall presents to the [facility] at the request of Dr. Annah Skene for chief complaint of new diagnosis of cirrhosis with HCC. He presents to the clinic with [name]. He was seen by Dr. Tye in General Surgery as initial consult for complaints of right inguinal hernia where he was ordered to have MRI liver w/wo contrast to follow-up of indeterminate liver lesions. He had noncontrasted CT abd/pelvis performed in August 2024 ordered by his urologist to follow-up on right flank/scrotal pain which did comment on morphological features of cirrhosis with indeterminate, intermediate lesion in right hepatic lobe measuring 2.2 x 1.7 cm. MRI was ordered over two weeks ago and resulted last week on 11/26 which commented on cirrhosis of the liver with portal venous hypertension, portal venous collaterals, splenomegaly, and ascites with two early arterial phase enhancing lesions in segment 6 considered LI-RADS 5 lesions. He was referred to Oncology by Dr. Tye and was seen by Dr. Skene yesterday in the clinic. He was told he needed to establish care with Transplant Hepatology and get a CT chest to complete staging work-up. I received referral yesterday afternoon and asked patient to come see me this morning.   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  He has intermittent abdominal pain across the middle of his abdomen described as sharp and aching in nature.  He denies any early satiety or change of bowel habits or overt rectal bleeding.  Current Facility-Administered Medications:    0.9 %  sodium chloride  infusion, , Intravenous, Continuous, [place], Ermin Parisien K, MD, Last Rate: 20 mL/hr at 12/30/23 1129, 20 mL/hr at 12/30/23  1129  Medications Prior to Admission  Medication Sig Dispense Refill Last Dose/Taking   acetaminophen  (TYLENOL ) 500 MG tablet Take 1,000 mg by mouth every 6 (six) hours as needed for moderate pain.   12/29/2023   albuterol  (VENTOLIN  HFA) 108 (90 Base) MCG/ACT inhaler Inhale into the lungs.   12/29/2023   aspirin EC 81 MG tablet Take 81 mg by mouth every morning. Swallow whole.   Past Week   atenolol  (TENORMIN ) 50 MG tablet Take 50 mg by mouth every morning.   12/30/2023 at  9:00 AM   diazepam (VALIUM) 5 MG tablet    12/29/2023   omeprazole (PRILOSEC) 20 MG capsule Take 20 mg by mouth every morning.   12/30/2023 at  9:00 AM   rosuvastatin  (CRESTOR ) 10 MG tablet Take 1 tablet by mouth every morning.   12/30/2023 at  9:00 AM   tamsulosin  (FLOMAX ) 0.4 MG CAPS capsule Take 1 capsule (0.4 mg total) by mouth daily after breakfast. 30 capsule 0 12/29/2023     Allergies  Allergen Reactions   Tramadol  Other (See Comments)    Gi distress     Past Medical History:  Diagnosis Date   Aortic atherosclerosis (HCC)    Cancer (HCC)    Coronary artery disease    Full dentures    GERD (gastroesophageal reflux disease)    Heart murmur    Hepatocellular carcinoma (HCC) 2024   History of blood transfusion    age 78 after tonisillectomy   History of kidney stones    Hypertension    Myocardial infarction [facility])    Nuclear sclerotic cataract of right eye 01/31/2016  Osteoarthritis of left knee 09/16/2020   Right inguinal hernia    Splenomegaly    Thrombocytopenia (HCC)     Review of systems:  Otherwise negative.    Physical Exam  Gen: Alert, oriented. Appears stated age.  HEENT: /AT. PERRLA. Lungs: CTA, no wheezes. CV: RR nl S1, S2. Abd: soft, benign, no masses. BS+ Ext: No edema. Pulses 2+    Planned procedures: Proceed with EGD. The patient understands the nature of the planned procedure, indications, risks, alternatives and potential complications including but not limited to bleeding,  infection, perforation, damage to internal organs and possible oversedation/side effects from anesthesia. The patient agrees and gives consent to proceed.  Please refer to procedure notes for findings, recommendations and patient disposition/instructions.     [name], M.D. Gastroenterology 12/30/2023  11:31 AM

## 2023-12-30 NOTE — Anesthesia Preprocedure Evaluation (Signed)
 Anesthesia Evaluation  Patient identified by MRN, date of birth, ID band Patient awake    Reviewed: Allergy & Precautions, NPO status , Patient's Chart, lab work & pertinent test results  History of Anesthesia Complications Negative for: history of anesthetic complications  Airway Mallampati: III  TM Distance: <3 FB Neck ROM: full    Dental  (+) Missing   Pulmonary neg shortness of breath, former smoker   Pulmonary exam normal        Cardiovascular hypertension, (-) angina + CAD and + Past MI  Normal cardiovascular exam     Neuro/Psych negative neurological ROS  negative psych ROS   GI/Hepatic Neg liver ROS,GERD  Controlled,,  Endo/Other  negative endocrine ROS    Renal/GU negative Renal ROS  negative genitourinary   Musculoskeletal   Abdominal   Peds  Hematology negative hematology ROS (+)   Anesthesia Other Findings Past Medical History: No date: Aortic atherosclerosis (HCC) No date: Cancer (HCC) No date: Coronary artery disease No date: Full dentures No date: GERD (gastroesophageal reflux disease) No date: Heart murmur 2024: Hepatocellular carcinoma (HCC) No date: History of blood transfusion     Comment:  age 78 after tonisillectomy No date: History of kidney stones No date: Hypertension No date: Myocardial infarction (HCC) 01/31/2016: Nuclear sclerotic cataract of right eye 09/16/2020: Osteoarthritis of left knee No date: Right inguinal hernia No date: Splenomegaly No date: Thrombocytopenia (HCC)  Past Surgical History: 1990's: CARDIAC CATHETERIZATION     Comment:  POBA 2019: CATARACT EXTRACTION W/ INTRAOCULAR LENS  IMPLANT, BILATERAL;  Bilateral 2018: COLONOSCOPY 08/25/2023: CYSTOSCOPY W/ URETERAL STENT REMOVAL; Bilateral     Comment:  Procedure: CYSTOSCOPY WITH STENT REMOVAL;  Surgeon:               Twylla Glendia BROCKS, MD;  Location: [facility] ORS;  Service:               Urology;  Laterality:  Bilateral; 08/04/2023: CYSTOSCOPY/URETEROSCOPY/HOLMIUM LASER/STENT PLACEMENT;  Bilateral     Comment:  Procedure: CYSTOSCOPY/URETEROSCOPY/HOLMIUM LASER/STENT               PLACEMENT;  Surgeon: Twylla Glendia BROCKS, MD;  Location:               [facility] ORS;  Service: Urology;  Laterality: Bilateral; No date: EYE SURGERY 11/03/2023: IR RADIOLOGIST EVAL & MGMT 02/08/2021: KNEE ARTHROPLASTY; Left     Comment:  Procedure: COMPUTER ASSISTED TOTAL KNEE ARTHROPLASTY -               RNFA;  Surgeon: Mardee Lynwood SQUIBB, MD;  Location: [facility] ORS;              Service: Orthopedics;  Laterality: Left; 06/17/2021: KNEE ARTHROPLASTY; Right     Comment:  Procedure: COMPUTER ASSISTED TOTAL KNEE ARTHROPLASTY;                Surgeon: Mardee Lynwood SQUIBB, MD;  Location: [facility] ORS;                Service: Orthopedics;  Laterality: Right; 1976: KNEE ARTHROSCOPY; Left 1960: TONSILLECTOMY  BMI    Body Mass Index: 28.60 kg/m      Reproductive/Obstetrics negative OB ROS                             Anesthesia Physical Anesthesia Plan  ASA: 3  Anesthesia Plan: General   Post-op Pain Management:    Induction: Intravenous  PONV Risk Score  and Plan: Propofol  infusion and TIVA  Airway Management Planned: Natural Airway and Nasal Cannula  Additional Equipment:   Intra-op Plan:   Post-operative Plan:   Informed Consent: I have reviewed the patients History and Physical, chart, labs and discussed the procedure including the risks, benefits and alternatives for the proposed anesthesia with the patient or authorized representative who has indicated his/her understanding and acceptance.     Dental Advisory Given  Plan Discussed with: Anesthesiologist, CRNA and Surgeon  Anesthesia Plan Comments: (Patient consented for risks of anesthesia including but not limited to:  - adverse reactions to medications - risk of airway placement if required - damage to eyes, teeth, lips or other oral  mucosa - nerve damage due to positioning  - sore throat or hoarseness - Damage to heart, brain, nerves, lungs, other parts of body or loss of life  Patient voiced understanding and assent.)       Anesthesia Quick Evaluation

## 2023-12-30 NOTE — Op Note (Signed)
 [facility] Patient Name: Luke Wall Procedure Date: 12/30/2023 11:31 AM MRN: [id number] Account #: [id number] Date of Birth: 20-Sep-1946 Admit Type: Outpatient Age: 78 Room: [facility] ENDO ROOM 2 Gender: Male Note Status: Finalized Instrument Name: Upper Endoscope [id number] Procedure:             Upper GI endoscopy Indications:           CIrrhosis of the liver, Portal venous hypertension Providers:             Chrisma Hurlock K. Aundria MD, MD Referring MD:          Vanetta Rule K. Aundria MD, MD (Referring MD), Lynwood FALCON.                         Valora, MD (Referring MD) Medicines:             Propofol  per Anesthesia Complications:         No immediate complications. Estimated blood loss: None. Procedure:             Pre-Anesthesia Assessment:                        - The risks and benefits of the procedure and the                         sedation options and risks were discussed with the                         patient. All questions were answered and informed                         consent was obtained.                        - Patient identification and proposed procedure were                         verified prior to the procedure by the nurse. The                         procedure was verified in the procedure room.                        - ASA Grade Assessment: III - A patient with severe                         systemic disease.                        - After reviewing the risks and benefits, the patient                         was deemed in satisfactory condition to undergo the                         procedure.                        After obtaining informed consent, the endoscope was  passed under direct vision. Throughout the procedure,                         the patient's blood pressure, pulse, and oxygen                         saturations were monitored continuously. The Endoscope                         was introduced through  the mouth, and advanced to the                         third part of duodenum. The upper GI endoscopy was                         accomplished without difficulty. The patient tolerated                         the procedure well. Findings:      Grade I varices were found in the lower third of the esophagus. They       were 6 mm in largest diameter. Estimated blood loss: none.      The exam of the esophagus was otherwise normal.      Mild portal hypertensive gastropathy was found in the entire examined       stomach.      There is no endoscopic evidence of hiatal hernia, ulceration or varices       in the entire examined stomach.      The examined duodenum was normal. Impression:            - Grade I esophageal varices.                        - Portal hypertensive gastropathy.                        - Normal examined duodenum.                        - No specimens collected. Recommendation:        - Patient has a contact number available for                         emergencies. The signs and symptoms of potential                         delayed complications were discussed with the patient.                         Return to normal activities tomorrow. Written                         discharge instructions were provided to the patient.                        - Resume previous diet.                        - Continue present medications.                        -  Repeat upper endoscopy in 2 years for screening                         purposes.                        - Follow up with Jonette Primmer, PA-C in the GI office.                         [phone]                        - Telephone GI office to schedule appointment in 4                         months.                        - The findings and recommendations were discussed with                         the patient. Procedure Code(s):     --- Professional ---                        [phone], Esophagogastroduodenoscopy, flexible,                          transoral; diagnostic, including collection of                         specimen(s) by brushing or washing, when performed                         (separate procedure) Diagnosis Code(s):     --- Professional ---                        K31.89, Other diseases of stomach and duodenum                        K76.6, Portal hypertension                        I85.00, Esophageal varices without bleeding CPT copyright 2022 American Medical Association. All rights reserved. The codes documented in this report are preliminary and upon coder review may  be revised to meet current compliance requirements. Ladell MARLA Boss MD, MD 12/30/2023 11:54:56 AM This report has been signed electronically. Number of Addenda: 0 Note Initiated On: 12/30/2023 11:31 AM Estimated Blood Loss:  Estimated blood loss: none.      [facility]

## 2023-12-30 NOTE — Transfer of Care (Signed)
 Immediate Anesthesia Transfer of Care Note  Patient: Luke Wall  Procedure(s) Performed: ESOPHAGOGASTRODUODENOSCOPY (EGD) WITH PROPOFOL   Patient Location: PACU  Anesthesia Type:General  Level of Consciousness: drowsy  Airway & Oxygen Therapy: Patient Spontanous Breathing and Patient connected to nasal cannula oxygen  Post-op Assessment: Report given to RN and Post -op Vital signs reviewed and stable  Post vital signs: stable  Last Vitals:  Vitals Value Taken Time  BP 123/64 12/30/23 1154  Temp    Pulse 52 12/30/23 1155  Resp 17 12/30/23 1155  SpO2 98 % 12/30/23 1155  Vitals shown include unfiled device data.  Last Pain:  Vitals:   12/30/23 1154  TempSrc:   PainSc: Asleep         Complications: No notable events documented.

## 2023-12-30 NOTE — Interval H&P Note (Signed)
 History and Physical Interval Note:  12/30/2023 11:36 AM  Luke Wall  has presented today for surgery, with the diagnosis of C22.0 (ICD-10-CM) - Hepatocellular carcinoma (CMS/HHS-HCC) K74.60, R18.8 (ICD-10-CM) - Cirrhosis of liver with ascites, unspecified hepatic cirrhosis type  (CMS/HHS-HCC) K76.6 (ICD-10-CM) - Portal venous hypertension (CMS/HHS-HCC) R16.1 (ICD-10-CM) - Splenomegaly.  The various methods of treatment have been discussed with the patient and family. After consideration of risks, benefits and other options for treatment, the patient has consented to  Procedure(s): ESOPHAGOGASTRODUODENOSCOPY (EGD) WITH PROPOFOL  (N/A) as a surgical intervention.  The patient's history has been reviewed, patient examined, no change in status, stable for surgery.  I have reviewed the patient's chart and labs.  Questions were answered to the patient's satisfaction.     [place], Endy Easterly

## 2023-12-31 ENCOUNTER — Encounter: Payer: Self-pay | Admitting: Internal Medicine

## 2023-12-31 NOTE — Anesthesia Postprocedure Evaluation (Signed)
 Anesthesia Post Note  Patient: Luke Wall  Procedure(s) Performed: ESOPHAGOGASTRODUODENOSCOPY (EGD) WITH PROPOFOL   Patient location during evaluation: Endoscopy Anesthesia Type: General Level of consciousness: awake and alert Pain management: pain level controlled Vital Signs Assessment: post-procedure vital signs reviewed and stable Respiratory status: spontaneous breathing, nonlabored ventilation, respiratory function stable and patient connected to nasal cannula oxygen Cardiovascular status: blood pressure returned to baseline and stable Postop Assessment: no apparent nausea or vomiting Anesthetic complications: no   There were no known notable events for this encounter.   Last Vitals:  Vitals:   12/30/23 1154 12/30/23 1214  BP: 123/64 (!) 185/83  Pulse: (!) 52   Resp: 17   Temp: (!) 36.4 C   SpO2: 98%     Last Pain:  Vitals:   12/31/23 0746  TempSrc:   PainSc: 0-No pain                 Fairy POUR Amye Grego

## 2024-01-01 ENCOUNTER — Other Ambulatory Visit: Payer: Self-pay | Admitting: Radiology

## 2024-01-03 NOTE — H&P (Signed)
Chief Complaint: Liver Lesions. Request is for liver lesion biopsy and con-current ablation X 2  Referring Physician(s): Dr. Owens Shark  Supervising Physician: Gilmer Mor  Patient Status: [facility] - Out-pt  History of Present Illness: Luke Wall is a 78 y.o. male outpatient. Former drinker. History of thrombocytopenia, HTN, GER, CAD, MI, nephrolithiasis s/p stent placement, portal hypertension. Recently diagnosis with cirrhosis and HCC after being found to have  2 right sided liver lesions while being worked up for abdominal pain.  The patient was seen for consultation in the Interventional Radiology Clinic  on 12.10.24 with IR Attending Dr. Ruthy Dick. At that time a detailed discussion regarding the Patient's medical condition including but not limited to possible treatment options took place. Following that discussion the Patient and his wife who elected to proceed with liver biopsy and . The Patient presents today for liver biopsy and con-current ablation.  Patient alert and laying in bed,calm. Endorses right groin pain that he states is related to his right inguinal hernia. Denies any fevers, headache, chest pain, SOB, cough, abdominal pain, nausea, vomiting or bleeding.    Labs pending. Patient is on 81 mg of ASA. Last dose given on 2.5.25. Allergies include tramadol. Patient has been NPO since midnight.   Return precautions and treatment recommendations and follow-up discussed with the patient  who is agreeable with the plan.     Past Medical History:  Diagnosis Date   Angina pectoris (HCC)    Aortic atherosclerosis (HCC)    Ascending aorta dilation (HCC)    a.) CT chest 11/04/2023: 4.1 cm   Cervicalgia    Cholelithiasis    Cirrhosis (HCC)    Coronary artery disease    a.) s/p PTCA/POBA in the 1990s at [facility] - details unclear; c.) CT chest 11/04/2023: 2v CAD   Diverticulosis    Full dentures    GERD (gastroesophageal reflux disease)    Heart murmur     Hepatocellular carcinoma (HCC) 2024   a.) stage II (T2 N0 M0)   History of alcohol abuse    History of bilateral cataract extraction 2019   History of blood transfusion 1960   a.) age 4; postoperatively following tonsillectomy   History of kidney stones    Hypertension    Myocardial infarction [facility])    Osteoarthritis of left knee 09/16/2020   Portal venous hypertension (HCC)    Rheumatoid arthritis (HCC)    Right inguinal hernia    Splenomegaly    Thrombocytopenia (HCC)     Past Surgical History:  Procedure Laterality Date   CARDIAC CATHETERIZATION  1990's   POBA   CATARACT EXTRACTION W/ INTRAOCULAR LENS  IMPLANT, BILATERAL Bilateral 2019   COLONOSCOPY  2018   CYSTOSCOPY W/ URETERAL STENT REMOVAL Bilateral 08/25/2023   Procedure: CYSTOSCOPY WITH STENT REMOVAL;  Surgeon: Riki Altes, MD;  Location: [facility] ORS;  Service: Urology;  Laterality: Bilateral;   CYSTOSCOPY/URETEROSCOPY/HOLMIUM LASER/STENT PLACEMENT Bilateral 08/04/2023   Procedure: CYSTOSCOPY/URETEROSCOPY/HOLMIUM LASER/STENT PLACEMENT;  Surgeon: Riki Altes, MD;  Location: [facility] ORS;  Service: Urology;  Laterality: Bilateral;   ESOPHAGOGASTRODUODENOSCOPY (EGD) WITH PROPOFOL N/A 12/30/2023   Procedure: ESOPHAGOGASTRODUODENOSCOPY (EGD) WITH PROPOFOL;  Surgeon: [place], Boykin Nearing, MD;  Location: [facility] ENDOSCOPY;  Service: Gastroenterology;  Laterality: N/A;   IR RADIOLOGIST EVAL & MGMT  11/03/2023   KNEE ARTHROPLASTY Left 02/08/2021   Procedure: COMPUTER ASSISTED TOTAL KNEE ARTHROPLASTY - RNFA;  Surgeon: Donato Heinz, MD;  Location: [facility] ORS;  Service: Orthopedics;  Laterality: Left;  KNEE ARTHROPLASTY Right 06/17/2021   Procedure: COMPUTER ASSISTED TOTAL KNEE ARTHROPLASTY;  Surgeon: Donato Heinz, MD;  Location: [facility] ORS;  Service: Orthopedics;  Laterality: Right;   KNEE ARTHROSCOPY Left 1976   TONSILLECTOMY  1960    Allergies: Tramadol  Medications: Prior to Admission medications   Medication Sig  Start Date End Date Taking? Authorizing Provider  acetaminophen (TYLENOL) 500 MG tablet Take 1,000 mg by mouth every 6 (six) hours as needed for moderate pain.    [provider]  albuterol (VENTOLIN HFA) 108 (90 Base) MCG/ACT inhaler Inhale into the lungs. 08/04/23   [provider]  aspirin EC 81 MG tablet Take 81 mg by mouth every morning. Swallow whole.    [provider]  atenolol (TENORMIN) 50 MG tablet Take 50 mg by mouth every morning.    [provider]  diazepam (VALIUM) 5 MG tablet  10/13/23   [provider]  omeprazole (PRILOSEC) 20 MG capsule Take 20 mg by mouth every morning.    [provider]  rosuvastatin (CRESTOR) 10 MG tablet Take 1 tablet by mouth every morning. 07/08/23   [provider]  tamsulosin (FLOMAX) 0.4 MG CAPS capsule Take 1 capsule (0.4 mg total) by mouth daily after breakfast. 08/25/23   Stoioff, Verna Czech, MD     Family History  Family history unknown: Yes    Social History   Socioeconomic History   Marital status: Married    Spouse name: Bonita Quin   Number of children: 1   Years of education: Not on file   Highest education level: Not on file  Occupational History   Not on file  Tobacco Use   Smoking status: Former    Current packs/day: 0.50    Average packs/day: 0.5 packs/day for 11.1 years (5.6 ttl pk-yrs)    Types: Cigarettes    Start date: 2000    Passive exposure: Past   Smokeless tobacco: Never  Vaping Use   Vaping status: Never Used  Substance and Sexual Activity   Alcohol use: Not Currently   Drug use: Never   Sexual activity: Not on file  Other Topics Concern   Not on file  Social History Narrative   Lives with wife at home. Pets(dog) at home   Social Drivers of Health   Financial Resource Strain: Patient Declined (10/06/2023)   Received from [facility]   Overall Financial Resource Strain (CARDIA)    Difficulty of Paying Living Expenses: Patient  declined  Food Insecurity: No Food Insecurity (10/27/2023)   Hunger Vital Sign    Worried About Running Out of Food in the Last Year: Never true    Ran Out of Food in the Last Year: Never true  Transportation Needs: No Transportation Needs (10/27/2023)   PRAPARE - Administrator, Civil Service (Medical): No    Lack of Transportation (Non-Medical): No  Physical Activity: Not on file  Stress: Not on file  Social Connections: Not on file    Review of Systems: A 12 point ROS discussed and pertinent positives are indicated in the HPI above.  All other systems are negative.  Review of Systems  Constitutional:  Negative for fever.  HENT:  Negative for congestion.   Respiratory:  Negative for cough and shortness of breath.   Cardiovascular:  Negative for chest pain.  Gastrointestinal:  Negative for abdominal pain.  Musculoskeletal:  Positive for myalgias (right inguinal hernia. Worse when standing).  Neurological:  Negative for headaches.  Psychiatric/Behavioral:  Negative for behavioral problems and confusion.     Vital Signs: BP (!) 209/85   Pulse (!) 54   Temp 97.8 F (36.6 C) (Oral)   Resp 18   Ht 5\' 7"  (1.702 m)   Wt 183 lb 4.8 oz (83.1 kg)   SpO2 99%   BMI 28.71 kg/m   Advance Care Plan: The advanced care plan/surrogate decision maker was discussed at the time of visit and documented in the medical record.    Physical Exam Vitals and nursing note reviewed.  Constitutional:      Appearance: He is well-developed.  HENT:     Head: Normocephalic.     Mouth/Throat:     Mouth: Mucous membranes are dry.  Cardiovascular:     Rate and Rhythm: Regular rhythm. Bradycardia present.  Pulmonary:     Effort: Pulmonary effort is normal.  Musculoskeletal:        General: Normal range of motion.     Cervical back: Normal range of motion.  Skin:    General: Skin is warm and dry.  Neurological:     General: No focal deficit present.     Mental Status: He is alert and  oriented to person, place, and time. Mental status is at baseline.  Psychiatric:        Mood and Affect: Mood normal.        Behavior: Behavior normal.        Thought Content: Thought content normal.        Judgment: Judgment normal.     Imaging: No results found.  Labs:  CBC: Recent Labs    08/04/23 1404 08/24/23 0915 10/27/23 1251 12/24/23 1307  WBC 4.6 5.3 4.5 4.0  HGB 13.4 14.3 13.4 14.2  HCT 39.2 39.9 39.2 41.1  PLT 59* 88* 71* 77*    COAGS: Recent Labs    10/27/23 1251 12/24/23 1307  INR 1.3* 1.2    BMP: Recent Labs    08/04/23 1404 10/27/23 1251 12/24/23 1307  NA 141 137 142  K 4.1 4.1 3.5  CL 108 108 109  CO2 25 19* 24  GLUCOSE 97 100* 141*  BUN 14 16 15   CALCIUM 9.0 9.0 9.1  CREATININE 1.07 0.97 1.04  GFRNONAA >60 >60 >60    LIVER FUNCTION TESTS: Recent Labs    10/27/23 1251 12/24/23 1307  BILITOT 1.7* 2.3*  AST 44* 40  ALT 27 26  ALKPHOS 57 57  PROT 6.5 6.9  ALBUMIN 3.1* 3.4*     Assessment and Plan:  78 y.o. male outpatient. Former drinker. History of thrombocytopenia, HTN, GER, CAD, MI, nephrolithiasis s/p stent placement, portal hypertension. Recently diagnosis with cirrhosis and HCC after being found to have  2 right sided liver lesions while being worked up for abdominal pain. . The patient was seen for consultation in the Interventional Radiology Clinic  on 12.10.24  with IR Attending Dr. Ruthy Dick. At that time a detailed discussion regarding the Patient's medical condition including but not limited to possible treatment options took place. Following that discussion the Patient and his wife who elected to proceed with liver biopsy and . The Patient presents today for liver biopsy and con-current ablation.  PLAN IR Image Guided Liver Lesion Microwave Ablation  X 2 and Possible Biopsy.   The Risks and benefits of embolization were discussed with the patient including, but not limited to bleeding, infection, vascular injury,  post operative pain, or contrast induced renal failure.  This procedure involves  the use of X-rays and because of the nature of the planned procedure, it is possible that we will have prolonged use of X-ray fluoroscopy.  Potential radiation risks to you include (but are not limited to) the following: - A slightly elevated risk for cancer several years later in life. This risk is typically less than 0.5% percent. This risk is low in comparison to the normal incidence of human cancer, which is 33% for women and 50% for men according to the American Cancer Society. - Radiation induced injury can include skin redness, resembling a rash, tissue breakdown / ulcers and hair loss (which can be temporary or permanent).   The likelihood of either of these occurring depends on the difficulty of the procedure and whether you are sensitive to radiation due to previous procedures, disease, or genetic conditions.   IF your procedure requires a prolonged use of radiation, you will be notified and given written instructions for further action.  It is your responsibility to monitor the irradiated area for the 2 weeks following the procedure and to notify your physician if you are concerned that you have suffered a radiation induced injury.    All of the patient's questions were answered, patient is agreeable to proceed. Consent signed and in chart.  Thank you for this interesting consult.  I greatly enjoyed meeting Luke Wall and look forward to participating in their care.  A copy of this report was sent to the requesting provider on this date.  Electronically Signed: Alene Mires, NP 01/05/2024, 7:53 AM   I spent a total of    15 Minutes in face to face in clinical consultation, greater than 50% of which was counseling/coordinating care for liver lesion microwave ablation and possible biopsy

## 2024-01-04 ENCOUNTER — Other Ambulatory Visit: Payer: Self-pay | Admitting: Radiology

## 2024-01-04 ENCOUNTER — Encounter: Payer: Self-pay | Admitting: Urgent Care

## 2024-01-04 ENCOUNTER — Other Ambulatory Visit (HOSPITAL_COMMUNITY): Payer: Self-pay | Admitting: Student

## 2024-01-04 DIAGNOSIS — C220 Liver cell carcinoma: Secondary | ICD-10-CM

## 2024-01-04 NOTE — Progress Notes (Addendum)
 Perioperative / Anesthesia Services  Pre-Admission Testing Clinical Review / Pre-Operative Anesthesia Consult  Date: 01/04/24  Patient Demographics:  Name: JERRARD BRADBURN DOB: [birth year] MRN:   [id number]  Planned Surgical Procedure(s):    Date/Time: 01/05/24 0830   Procedure: CT BONE TUMOR(S)RF ABLATION   Diagnosis: Hepatocellular carcinoma (HCC) [C22.0]   Indications: Microwave Ablation with GETA   Location: [facility] CT IMAGING      NOTE: Available PAT nursing documentation and vital signs have been reviewed. Clinical nursing staff has updated patient's PMH/PSHx, current medication list, and drug allergies/intolerances to ensure comprehensive history available to assist in medical decision making as it pertains to the aforementioned surgical procedure and anticipated anesthetic course. Extensive review of available clinical information personally performed. Bruce PMH and PSHx updated with any diagnoses/procedures that  may have been inadvertently omitted during his intake with the pre-admission testing department's nursing staff.  Clinical Discussion:  WILBORN MEMBRENO is a 78 y.o. male who is submitted for pre-surgical anesthesia review and clearance prior to him undergoing the above procedure. Patient is a Former Smoker (5.6 pack years). Pertinent PMH includes: CAD, ? 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  Patient is followed by cardiology Darrold Junker, MD). He was last seen in the cardiology clinic on 11/04/2023; notes reviewed. At the time of his clinic visit, patient doing well overall from a cardiovascular perspective.  Patient with occasional episodes of palpitations.  Patient denied any chest pain, shortness of  breath, PND, orthopnea, significant peripheral edema, weakness, fatigue, vertiginous symptoms, or presyncope/syncope. Patient with a past medical history significant for cardiovascular diagnoses. Documented physical exam was grossly benign, providing no evidence of acute exacerbation and/or decompensation of the patient's known cardiovascular conditions.  Of note, complete records regarding patient's cardiovascular history unavailable for review at time of consult.  Information gathered from patient report and from extensive review of notes from his local care providers.  Patient reportedly underwent diagnostic LEFT heart catheterization back in the 1990s.  Patient with significant CAD resulting in PTCA/POBA.  Again, details regarding degree of stenosis and vessels requiring intervention unknown at time of consult.  Most recent TTE was performed on 08/18/2019 revealing a normal left ventricular systolic function with an EF of >55%.  There was mild LVH.  There were no regional wall motion abnormalities.  Left atrium was mildly enlarged.  There was mild mitral, tricuspid, and pulmonary valve regurgitation.  RVSP = 35.4 mmHg.  All transvalvular gradients were noted to be normal providing no evidence suggestive of valvular stenosis.  Aorta normal in size with no evidence of ectasia or aneurysmal dilatation.  Most recent myocardial perfusion imaging study was performed on 09/29/2022 revealing a normal left ventricular systolic function with an EF of 63 %.  There were no regional wall motion abnormalities.  There was no evidence of stress-induced myocardial ischemia or arrhythmia; no scintigraphic evidence of scar.  Study was determined to be normal and low risk.  Blood pressure reasonably controlled at 142/90 mmHg on currently prescribed beta-blocker (atenolol) monotherapy.  Patient is on rosuvastatin for his HLD diagnosis and ASCVD prevention. Patient is not diabetic. He does not have an OSAH diagnosis. Patient  is able to complete all of his  ADL/IADLs without cardiovascular limitation.  Per the [facility], patient is able to achieve at least 4 METS of  physical activity without experiencing any significant degree of angina/anginal equivalent symptoms. No changes were made to his medication regimen during his visit with cardiology.  Patient scheduled to follow-up with outpatient cardiology in 4 months or sooner if needed.  MCKENNA BORUFF has undergone MRI imaging of the abdomen revealing 2 indeterminate lesions located in segment 6 of the liver, 1 measuring 2 cm and the other 2.3 x 2.2 cm. There was associated cirrhotic changes, portal venous hypertension, splenomegaly and ascites.  Lesions were characterized as LI-RADS 5.  Patient is currently under the care of Dr. Owens Shark, MD of the [facility].  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  Radiologic findings and clinical manifestations have been discussed at interdisciplinary tumor board.  Given cirrhosis, portal venous hypertension, and ascites, it is felt that patient would be a likely candidate for definitive surgical resection.  He has been referred for transplant evaluation.  While awaiting formal transplant opinion from [facility], interventional radiology feels as if they would be able to treat patient via CT-guided microwave ablation.  He has been seen in consult and has been scheduled for the procedure on 01/05/2024 with Dr. Ruthy Dick, DO.  Given patient's past medical history significant for cardiovascular diagnoses, presurgical cardiac clearance was sought by the PAT team. Per cardiology, "this patient is optimized for surgery and may proceed with the planned procedural course with a LOW risk of significant perioperative cardiovascular complications".  In review of the patient's chart, it is noted that he is on daily oral antithrombotic therapy. He has been  instructed on recommendations for holding his daily low dose ASA for 5 days prior to his procedure with plans to restart as soon as postoperative bleeding risk felt to be minimized by his attending surgeon. The patient has been instructed that his last dose of ASA should be on 12/30/2023.  Patient denies previous perioperative complications with anesthesia in the past. In review his EMR, it is noted that patient underwent a general anesthetic course here at [facility] ([facility]) in 08/2023 without documented complications.      12/30/2023   12:14 PM 12/30/2023   11:54 AM 12/30/2023   11:19 AM  Vitals with BMI  Height   5\' 7"   Weight   182 lbs 10 oz  BMI   28.59  Systolic 185 123 409  Diastolic 83 64 81  Pulse  52 49   Providers/Specialists:  NOTE: Primary physician provider listed below. Patient may have been seen by APP or partner within same practice.   PROVIDER ROLE / SPECIALTY LAST Elizebeth Brooking, DO Interventional Radiology (Proceduralist) 11/03/2023  [name], MD Primary Care Provider 10/06/2023  Marcina Millard, MD Cardiology 11/04/2023  Owens Shark, MD Medical Oncology 11/10/2023  Alva Garnet, MD Transplant Surgery 12/23/2023   Allergies:   Allergies  Allergen Reactions   Tramadol Other (See Comments)    Gi distress   Current Home Medications:    acetaminophen (TYLENOL) 500 MG tablet   albuterol (VENTOLIN HFA) 108 (90 Base) MCG/ACT inhaler   aspirin EC 81 MG tablet   atenolol (TENORMIN) 50 MG tablet   diazepam (VALIUM) 5 MG tablet   omeprazole (PRILOSEC) 20 MG capsule   rosuvastatin (CRESTOR) 10 MG tablet   tamsulosin (FLOMAX) 0.4 MG CAPS capsule   No current facility-administered medications for this encounter.   History:   Past Medical History:  Diagnosis Date  Angina pectoris (HCC)    Aortic atherosclerosis (HCC)    Ascending aorta dilation (HCC)    a.) CT chest 11/04/2023: 4.1 cm   Cervicalgia     Cholelithiasis    Cirrhosis (HCC)    Coronary artery disease    a.) s/p PTCA/POBA in the 1990s at [facility] - details unclear; c.) CT chest 11/04/2023: 2v CAD   Diverticulosis    Full dentures    GERD (gastroesophageal reflux disease)    Heart murmur    Hepatocellular carcinoma (HCC) 2024   a.) stage II (T2 N0 M0)   History of alcohol abuse    History of bilateral cataract extraction 2019   History of blood transfusion 1960   a.) age 48; postoperatively following tonsillectomy   History of kidney stones    Hypertension    Myocardial infarction [facility])    Osteoarthritis of left knee 09/16/2020   Portal venous hypertension (HCC)    Rheumatoid arthritis (HCC)    Right inguinal hernia    Splenomegaly    Thrombocytopenia (HCC)    Past Surgical History:  Procedure Laterality Date   CARDIAC CATHETERIZATION  1990's   POBA   CATARACT EXTRACTION W/ INTRAOCULAR LENS  IMPLANT, BILATERAL Bilateral 2019   COLONOSCOPY  2018   CYSTOSCOPY W/ URETERAL STENT REMOVAL Bilateral 08/25/2023   Procedure: CYSTOSCOPY WITH STENT REMOVAL;  Surgeon: Riki Altes, MD;  Location: [facility] ORS;  Service: Urology;  Laterality: Bilateral;   CYSTOSCOPY/URETEROSCOPY/HOLMIUM LASER/STENT PLACEMENT Bilateral 08/04/2023   Procedure: CYSTOSCOPY/URETEROSCOPY/HOLMIUM LASER/STENT PLACEMENT;  Surgeon: Riki Altes, MD;  Location: [facility] ORS;  Service: Urology;  Laterality: Bilateral;   ESOPHAGOGASTRODUODENOSCOPY (EGD) WITH PROPOFOL N/A 12/30/2023   Procedure: ESOPHAGOGASTRODUODENOSCOPY (EGD) WITH PROPOFOL;  Surgeon: Toledo, Boykin Nearing, MD;  Location: [facility] END[facility]COPY;  Service: Gastroenterology;  Laterality: N/A;   IR RADIOLOGIST EVAL & MGMT  11/03/2023   KNEE ARTHROPLASTY Left 02/08/2021   Procedure: COMPUTER ASSISTED TOTAL KNEE ARTHROPLASTY - RNFA;  Surgeon: Donato Heinz, MD;  Location: [facility] ORS;  Service: Orthopedics;  Laterality: Left;   KNEE ARTHROPLASTY Right 06/17/2021   Procedure: COMPUTER ASSISTED  TOTAL KNEE ARTHROPLASTY;  Surgeon: Donato Heinz, MD;  Location: [facility] ORS;  Service: Orthopedics;  Laterality: Right;   KNEE ARTHROSCOPY Left 1976   TONSILLECTOMY  1960   Family History  Family history unknown: Yes   Social History   Tobacco Use   Smoking status: Former    Current packs/day: 0.50    Average packs/day: 0.5 packs/day for 11.1 years (5.6 ttl pk-yrs)    Types: Cigarettes    Start date: 2000    Passive exposure: Past   Smokeless tobacco: Never  Substance Use Topics   Alcohol use: Not Currently   Pertinent Clinical Results:  LABS:  Hospital Outpatient Visit on 12/24/2023  Component Date Value Ref Range Status   WBC 12/24/2023 4.0  4.0 - 10.5 K/uL Final   RBC 12/24/2023 4.37  4.22 - 5.81 MIL/uL Final   Hemoglobin 12/24/2023 14.2  13.0 - 17.0 g/dL Final   HCT 47/82/9562 41.1  39.0 - 52.0 % Final   MCV 12/24/2023 94.1  80.0 - 100.0 fL Final   MCH 12/24/2023 32.5  26.0 - 34.0 pg Final   MCHC 12/24/2023 34.5  30.0 - 36.0 g/dL Final   RDW 13/06/6577 13.7  11.5 - 15.5 % Final   Platelets 12/24/2023 77 (L)  150 - 400 K/uL Final   Comment: Immature Platelet Fraction may be clinically indicated, consider ordering this additional  test [id number] PLATELET COUNT CONFIRMED BY SMEAR    nRBC 12/24/2023 0.0  0.0 - 0.2 % Final   Neutrophils Relative % 12/24/2023 59  % Final   Neutro Abs 12/24/2023 2.4  1.7 - 7.7 K/uL Final   Lymphocytes Relative 12/24/2023 27  % Final   Lymphs Abs 12/24/2023 1.1  0.7 - 4.0 K/uL Final   Monocytes Relative 12/24/2023 11  % Final   Monocytes Absolute 12/24/2023 0.4  0.1 - 1.0 K/uL Final   Eosinophils Relative 12/24/2023 2  % Final   Eosinophils Absolute 12/24/2023 0.1  0.0 - 0.5 K/uL Final   Basophils Relative 12/24/2023 1  % Final   Basophils Absolute 12/24/2023 0.0  0.0 - 0.1 K/uL Final   WBC Morphology 12/24/2023 MORPHOLOGY UNREMARKABLE   Final   RBC Morphology 12/24/2023 MORPHOLOGY UNREMARKABLE   Final   Smear Review 12/24/2023 Normal  platelet morphology   Final   Immature Granulocytes 12/24/2023 0  % Final   Abs Immature Granulocytes 12/24/2023 0.01  0.00 - 0.07 K/uL Final   Performed at [facility], [address] [place], [place] [address]   Prothrombin Time 12/24/2023 15.2  11.4 - 15.2 seconds Final   INR 12/24/2023 1.2  0.8 - 1.2 Final   Comment: (NOTE) INR goal varies based on device and disease states. Performed at [facility], [address] [place], [place] [address]    Sodium 12/24/2023 142  135 - 145 mmol/L Final   Potassium 12/24/2023 3.5  3.5 - 5.1 mmol/L Final   Chloride 12/24/2023 109  98 - 111 mmol/L Final   CO2 12/24/2023 24  22 - 32 mmol/L Final   Glucose, Bld 12/24/2023 141 (H)  70 - 99 mg/dL Final   Glucose reference range applies only to samples taken after fasting for at least 8 hours.   BUN 12/24/2023 15  8 - 23 mg/dL Final   Creatinine, Ser 12/24/2023 1.04  0.61 - 1.24 mg/dL Final   Calcium 25/42/7062 9.1  8.9 - 10.3 mg/dL Final   Total Protein 37/62/8315 6.9  6.5 - 8.1 g/dL Final   Albumin 17/61/6073 3.4 (L)  3.5 - 5.0 g/dL Final   AST 71/04/2693 40  15 - 41 U/L Final   ALT 12/24/2023 26  0 - 44 U/L Final   Alkaline Phosphatase 12/24/2023 57  38 - 126 U/L Final   Total Bilirubin 12/24/2023 2.3 (H)  0.0 - 1.2 mg/dL Final   GFR, Estimated 12/24/2023 >60  >60 mL/min Final   Comment: (NOTE) Calculated using the CKD-EPI Creatinine Equation (2021)    Anion gap 12/24/2023 9  5 - 15 Final   Performed at [facility], [address] [place], [place] [address]    ECG: Date: 12/24/2023  Time ECG obtained: 1316 PM Rate: 54 bpm Rhythm: sinus bradycardia Axis (leads I and aVF): normal Intervals: PR 194 ms. QRS 90 ms. QTc 491 ms. ST segment and T wave changes: Nonspecific ST and T wave abnormalities  Evidence of a possible, age undetermined, prior infarct:  No Comparison: Similar to previous tracing obtained on 08/24/2023   IMAGING / PROCEDURES: CT CHEST  WO CONTRAST performed on 11/04/2023 No evidence of metastatic disease in the chest. Two-vessel coronary atherosclerosis. Dilated 4.1 cm ascending thoracic aorta. Recommend annual imaging followup by CTA or MRA. This recommendation follows 2010 ACCF/AHA/AATS/ACR/ASA/SCA/SCAI/SIR/STS/SVM Guidelines for the Diagnosis and Management of Patients with Thoracic Aortic Disease. Circulation. 2010; 121: V035-K093. Aortic aneurysm NOS (ICD10-I71.9). Cirrhosis. Partially visualized mild splenomegaly.  Please see the 10/20/2023 recent MRI abdomen report for details regarding the known suspicious right liver masses, which are not well visualized on this noncontrast chest CT. Cholelithiasis. Aortic atherosclerosis  MR LIVER W WO CONTRAST performed on 10/20/2023 Cirrhotic changes involving the liver with portal venous hypertension, portal venous collaterals, splenomegaly and ascites. Two early arterial phase enhancing lesions in segment 6 of the liver, both considered LI-RADS 5 lesions. No abdominal lymphadenopathy.  CT ABDOMEN PELVIS WO CONTRAST performed on 07/10/2023 Bilateral nephrolithiasis. Mild right hydronephrosis and proximal hydroureter. Stone within the proximal right ureter measures 5 mm. Within the distal left ureter there is a stone just before the UVJ measuring 8 mm in longest dimension. Morphologic features of the liver suggestive of cirrhosis. Indeterminate, intermediate density lesion within the posterior dome of the right hepatic lobe measuring 2.2 x 1.7 cm. Recommend further evaluation with nonemergent, outpatient MRI of the abdomen with and without contrast. Splenomegaly. Nonspecific congestion within the small bowel mesenteric fat with increased soft tissue stranding. Colonic diverticulosis without signs of acute diverticulitis. Right inguinal hernia which contains fat and fluid. Aortic atherosclerosis   MYOCARDIAL PERFUSION IMAGING STUDY (LEXISCAN) performed on 09/29/2022 Normal left  ventricular systolic function with a normal LVEF of 63% Normal myocardial thickening and wall motion Left ventricular cavity size normal SPECT images demonstrate homogenous tracer distribution throughout the myocardium No evidence of stress-induced myocardial ischemia or arrhythmia Normal low risk study  TRANSTHORACIC ECHOCARDIOGRAM performed on 08/18/2019 Normal left ventricular systolic function with an EF of >55% Mild LVH Normal left ventricular diastolic Doppler parameters Mild LAE Normal right ventricular systolic function Mild MR, TR, PR RVSP = 35.4 mmHg Normal transvalvular gradients; no valvular stenosis No pericardial effusion  Impression and Plan:  ELIZANDRO LAURA has been referred for pre-anesthesia review and clearance prior to him undergoing the planned anesthetic and procedural courses. Available labs, pertinent testing, and imaging results were personally reviewed by me in preparation for upcoming operative/procedural course. [facility] medical record has been updated following extensive record review and patient interview with PAT staff.   Patient with a documented history of thrombocytopenia in the setting of cirrhosis, splenomegaly, and hepatocellular carcinoma.  Preoperative platelet count low at 77 K/uL.  Result previously sent to interventional radiologist on 12/25/2023 to make him aware.  This patient has been appropriately cleared by cardiology with an overall LOW risk of experiencing significant perioperative cardiovascular complications. Based on clinical review performed today (01/04/24), barring any significant acute changes in the patient's overall condition, it is anticipated that he will be able to proceed with the planned surgical intervention. Any acute changes in clinical condition may necessitate his procedure being postponed and/or cancelled. Patient will meet with anesthesia team (MD and/or CRNA) on the day of his procedure for preoperative  evaluation/assessment. Questions regarding anesthetic course will be fielded at that time.   Pre-surgical instructions were reviewed with the patient during his PAT appointment, and questions were fielded to satisfaction by PAT clinical staff. He has been instructed on which medications that he will need to hold prior to surgery, as well as the ones that have been deemed safe/appropriate to take on the day of his procedure. As part of the general education provided by PAT, patient made aware both verbally and in writing, that he would need to abstain from the use of any illegal substances during his perioperative course. He was advised that failure to follow the provided instructions could necessitate case cancellation or result in serious perioperative complications up to  and including death. Patient encouraged to contact PAT and/or his surgeon's office to discuss any questions or concerns that may arise prior to surgery; verbalized understanding.   Quentin Mulling, MSN, APRN, FNP-C, CEN [facility]  Perioperative Services Nurse Practitioner Phone: [phone] Fax: [phone] 01/04/24 10:24 AM  NOTE: This note has been prepared using Dragon dictation software. Despite my best ability to proofread, there is always the potential that unintentional transcriptional errors may still occur from this process.

## 2024-01-05 ENCOUNTER — Other Ambulatory Visit: Payer: Self-pay | Admitting: Interventional Radiology

## 2024-01-05 ENCOUNTER — Other Ambulatory Visit: Payer: Self-pay

## 2024-01-05 ENCOUNTER — Other Ambulatory Visit (HOSPITAL_COMMUNITY): Payer: Self-pay | Admitting: Radiology

## 2024-01-05 ENCOUNTER — Ambulatory Visit: Payer: Self-pay | Admitting: Radiology

## 2024-01-05 ENCOUNTER — Observation Stay
Admission: RE | Admit: 2024-01-05 | Discharge: 2024-01-06 | Disposition: A | Discharge status: Home / Self Care | Payer: Medicare HMO | Source: Ambulatory Visit | Attending: Interventional Radiology | Admitting: Interventional Radiology

## 2024-01-05 ENCOUNTER — Encounter: Payer: Self-pay | Admitting: Urgent Care

## 2024-01-05 VITALS — BP 124/59 | HR 63 | Temp 97.9°F | Resp 16 | Ht 67.0 in | Wt 183.3 lb

## 2024-01-05 DIAGNOSIS — I25119 Atherosclerotic heart disease of native coronary artery with unspecified angina pectoris: Secondary | ICD-10-CM | POA: Insufficient documentation

## 2024-01-05 DIAGNOSIS — K219 Gastro-esophageal reflux disease without esophagitis: Secondary | ICD-10-CM | POA: Insufficient documentation

## 2024-01-05 DIAGNOSIS — M1990 Unspecified osteoarthritis, unspecified site: Secondary | ICD-10-CM | POA: Insufficient documentation

## 2024-01-05 DIAGNOSIS — Z87891 Personal history of nicotine dependence: Secondary | ICD-10-CM | POA: Diagnosis not present

## 2024-01-05 DIAGNOSIS — R160 Hepatomegaly, not elsewhere classified: Secondary | ICD-10-CM

## 2024-01-05 DIAGNOSIS — Z955 Presence of coronary angioplasty implant and graft: Secondary | ICD-10-CM | POA: Diagnosis not present

## 2024-01-05 DIAGNOSIS — Z9889 Other specified postprocedural states: Secondary | ICD-10-CM | POA: Insufficient documentation

## 2024-01-05 DIAGNOSIS — C220 Liver cell carcinoma: Secondary | ICD-10-CM

## 2024-01-05 DIAGNOSIS — Z7982 Long term (current) use of aspirin: Secondary | ICD-10-CM | POA: Diagnosis not present

## 2024-01-05 DIAGNOSIS — Z79899 Other long term (current) drug therapy: Secondary | ICD-10-CM | POA: Insufficient documentation

## 2024-01-05 DIAGNOSIS — I252 Old myocardial infarction: Secondary | ICD-10-CM | POA: Diagnosis not present

## 2024-01-05 DIAGNOSIS — I10 Essential (primary) hypertension: Secondary | ICD-10-CM | POA: Diagnosis not present

## 2024-01-05 HISTORY — DX: Diverticulosis of intestine, part unspecified, without perforation or abscess without bleeding: K57.90

## 2024-01-05 HISTORY — DX: Cervicalgia: M54.2

## 2024-01-05 HISTORY — DX: Alcohol abuse, in remission: F10.11

## 2024-01-05 HISTORY — DX: Calculus of gallbladder without cholecystitis without obstruction: K80.20

## 2024-01-05 HISTORY — DX: Rheumatoid arthritis, unspecified: M06.9

## 2024-01-05 HISTORY — DX: Angina pectoris, unspecified: I20.9

## 2024-01-05 HISTORY — DX: Portal hypertension: K76.6

## 2024-01-05 HISTORY — DX: Thoracic aortic ectasia: I77.810

## 2024-01-05 HISTORY — DX: Unspecified cirrhosis of liver: K74.60

## 2024-01-05 LAB — COMPREHENSIVE METABOLIC PANEL
ALT: 24 U/L (ref 0–44)
AST: 46 U/L — ABNORMAL HIGH (ref 15–41)
Albumin: 3.4 g/dL — ABNORMAL LOW (ref 3.5–5.0)
Alkaline Phosphatase: 57 U/L (ref 38–126)
Anion gap: 12 (ref 5–15)
BUN: 14 mg/dL (ref 8–23)
CO2: 18 mmol/L — ABNORMAL LOW (ref 22–32)
Calcium: 9.3 mg/dL (ref 8.9–10.3)
Chloride: 108 mmol/L (ref 98–111)
Creatinine, Ser: 1 mg/dL (ref 0.61–1.24)
GFR, Estimated: >60 mL/min (ref >60)
Glucose, Bld: 94 mg/dL (ref 70–99)
Potassium: 3.2 mmol/L — ABNORMAL LOW (ref 3.5–5.1)
Sodium: 138 mmol/L (ref 135–145)
Total Bilirubin: 2.3 mg/dL — ABNORMAL HIGH (ref 0.0–1.2)
Total Protein: 7.1 g/dL (ref 6.5–8.1)

## 2024-01-05 LAB — CBC
HCT: 40.1 % (ref 39.0–52.0)
Hemoglobin: 14.3 g/dL (ref 13.0–17.0)
MCH: 31.8 pg (ref 26.0–34.0)
MCHC: 35.7 g/dL (ref 30.0–36.0)
MCV: 89.1 fL (ref 80.0–100.0)
Platelets: 73 10*3/uL — ABNORMAL LOW (ref 150–400)
RBC: 4.5 MIL/uL (ref 4.22–5.81)
RDW: 13.3 % (ref 11.5–15.5)
WBC: 4.4 10*3/uL (ref 4.0–10.5)
nRBC: 0 % (ref 0.0–0.2)

## 2024-01-05 LAB — PROTIME-INR
INR: 1.3 — ABNORMAL HIGH (ref 0.8–1.2)
Prothrombin Time: 15.9 s — ABNORMAL HIGH (ref 11.4–15.2)

## 2024-01-05 MED ORDER — ROCURONIUM BROMIDE 100 MG/10ML IV SOLN
INTRAVENOUS | Status: DC | PRN
  Administered 2024-01-05: 50 mg via INTRAVENOUS
  Administered 2024-01-05: 30 mg via INTRAVENOUS
  Administered 2024-01-05: 20 mg via INTRAVENOUS
  Administered 2024-01-05: 10 mg via INTRAVENOUS
  Administered 2024-01-05: 20 mg via INTRAVENOUS

## 2024-01-05 MED ORDER — ATENOLOL 50 MG PO TABS
50.0000 mg | ORAL_TABLET | ORAL | Status: DC
Start: 2024-01-05 — End: 2024-01-06
  Administered 2024-01-05: 50 mg via ORAL
  Filled 2024-01-05 (×2): qty 1

## 2024-01-05 MED ORDER — SODIUM CHLORIDE 0.9 % IV SOLN
INTRAVENOUS | Status: AC
  Administered 2024-01-05: 125 mL/h via INTRAVENOUS
  Filled 2024-01-05 (×4): qty 500

## 2024-01-05 MED ORDER — ONDANSETRON HCL 4 MG/2ML IJ SOLN
INTRAMUSCULAR | Status: DC | PRN
  Administered 2024-01-05: 4 mg via INTRAVENOUS

## 2024-01-05 MED ORDER — LACTATED RINGERS IV BOLUS
500.0000 mL | Freq: Once | INTRAVENOUS | Status: AC
  Administered 2024-01-05: 500 mL via INTRAVENOUS

## 2024-01-05 MED ORDER — TAMSULOSIN HCL 0.4 MG PO CAPS
0.4000 mg | ORAL_CAPSULE | Freq: Every day | ORAL | Status: DC
  Administered 2024-01-05: 0.4 mg via ORAL
  Filled 2024-01-05: qty 1

## 2024-01-05 MED ORDER — SENNOSIDES-DOCUSATE SODIUM 8.6-50 MG PO TABS: 1.0000 | ORAL_TABLET | Freq: Every day | ORAL | Status: DC | PRN

## 2024-01-05 MED ORDER — MIDAZOLAM HCL 2 MG/2ML IJ SOLN
INTRAMUSCULAR | Status: DC | PRN
  Administered 2024-01-05: 2 mg via INTRAVENOUS

## 2024-01-05 MED ORDER — ONDANSETRON HCL 4 MG/2ML IJ SOLN: 4.0000 mg | Freq: Four times a day (QID) | INTRAMUSCULAR | Status: DC | PRN

## 2024-01-05 MED ORDER — FENTANYL CITRATE (PF) 100 MCG/2ML IJ SOLN
INTRAMUSCULAR | Status: AC
  Filled 2024-01-05: qty 2

## 2024-01-05 MED ORDER — DOCUSATE SODIUM 100 MG PO CAPS
100.0000 mg | ORAL_CAPSULE | Freq: Two times a day (BID) | ORAL | Status: DC
  Administered 2024-01-05 – 2024-01-06 (×3): 100 mg via ORAL
  Filled 2024-01-05 (×3): qty 1

## 2024-01-05 MED ORDER — FENTANYL CITRATE PF 50 MCG/ML IJ SOSY
25.0000 ug | PREFILLED_SYRINGE | INTRAMUSCULAR | Status: DC | PRN
  Administered 2024-01-05 (×2): 25 ug via INTRAVENOUS

## 2024-01-05 MED ORDER — PANTOPRAZOLE SODIUM 40 MG PO TBEC
40.0000 mg | DELAYED_RELEASE_TABLET | Freq: Every day | ORAL | Status: DC
  Administered 2024-01-05 – 2024-01-06 (×2): 40 mg via ORAL
  Filled 2024-01-05 (×2): qty 1

## 2024-01-05 MED ORDER — PROPOFOL 10 MG/ML IV BOLUS
INTRAVENOUS | Status: DC | PRN
  Administered 2024-01-05: 150 mg via INTRAVENOUS

## 2024-01-05 MED ORDER — HYDRALAZINE HCL 20 MG/ML IJ SOLN
INTRAMUSCULAR | Status: DC | PRN
Start: 2024-01-05 — End: 2024-01-05
  Administered 2024-01-05: 10 mg via INTRAVENOUS

## 2024-01-05 MED ORDER — ALBUTEROL SULFATE (2.5 MG/3ML) 0.083% IN NEBU: 3.0000 mL | INHALATION_SOLUTION | RESPIRATORY_TRACT | Status: DC | PRN

## 2024-01-05 MED ORDER — DROPERIDOL 2.5 MG/ML IJ SOLN: 0.6250 mg | Freq: Once | INTRAMUSCULAR | Status: DC | PRN

## 2024-01-05 MED ORDER — ROSUVASTATIN CALCIUM 10 MG PO TABS
10.0000 mg | ORAL_TABLET | ORAL | Status: DC
  Administered 2024-01-06: 10 mg via ORAL
  Filled 2024-01-05: qty 1

## 2024-01-05 MED ORDER — LIDOCAINE HCL (CARDIAC) PF 100 MG/5ML IV SOSY
PREFILLED_SYRINGE | INTRAVENOUS | Status: DC | PRN
  Administered 2024-01-05: 60 mg via INTRAVENOUS

## 2024-01-05 MED ORDER — DEXAMETHASONE SODIUM PHOSPHATE 10 MG/ML IJ SOLN
INTRAMUSCULAR | Status: DC | PRN
  Administered 2024-01-05: 5 mg via INTRAVENOUS

## 2024-01-05 MED ORDER — CHLORHEXIDINE GLUCONATE CLOTH 2 % EX PADS
6.0000 | MEDICATED_PAD | Freq: Every day | CUTANEOUS | Status: DC
  Administered 2024-01-05 – 2024-01-06 (×2): 6 via TOPICAL

## 2024-01-05 MED ORDER — FENTANYL CITRATE (PF) 100 MCG/2ML IJ SOLN
INTRAMUSCULAR | Status: DC | PRN
  Administered 2024-01-05 (×4): 25 ug via INTRAVENOUS
  Administered 2024-01-05: 50 ug via INTRAVENOUS
  Administered 2024-01-05 (×2): 25 ug via INTRAVENOUS

## 2024-01-05 MED ORDER — ROCURONIUM BROMIDE 10 MG/ML (PF) SYRINGE
PREFILLED_SYRINGE | INTRAVENOUS | Status: AC
  Filled 2024-01-05: qty 10

## 2024-01-05 MED ORDER — EPHEDRINE SULFATE-NACL 50-0.9 MG/10ML-% IV SOSY
PREFILLED_SYRINGE | INTRAVENOUS | Status: DC | PRN
  Administered 2024-01-05: 5 mg via INTRAVENOUS

## 2024-01-05 MED ORDER — MIDAZOLAM HCL 2 MG/2ML IJ SOLN
INTRAMUSCULAR | Status: AC
  Filled 2024-01-05: qty 2

## 2024-01-05 MED ORDER — HYDROCODONE-ACETAMINOPHEN 5-325 MG PO TABS
ORAL_TABLET | ORAL | Status: AC
  Filled 2024-01-05: qty 1

## 2024-01-05 MED ORDER — SODIUM CHLORIDE 0.9 % IV SOLN: INTRAVENOUS | Status: DC | PRN

## 2024-01-05 MED ORDER — SUGAMMADEX SODIUM 200 MG/2ML IV SOLN
INTRAVENOUS | Status: DC | PRN
  Administered 2024-01-05: 200 mg via INTRAVENOUS

## 2024-01-05 MED ORDER — HYDRALAZINE HCL 20 MG/ML IJ SOLN
INTRAMUSCULAR | Status: AC
  Filled 2024-01-05: qty 1

## 2024-01-05 MED ORDER — PHENYLEPHRINE 80 MCG/ML (10ML) SYRINGE FOR IV PUSH (FOR BLOOD PRESSURE SUPPORT)
PREFILLED_SYRINGE | INTRAVENOUS | Status: DC | PRN
  Administered 2024-01-05: 80 ug via INTRAVENOUS

## 2024-01-05 MED ORDER — HYDROCODONE-ACETAMINOPHEN 5-325 MG PO TABS
1.0000 | ORAL_TABLET | ORAL | Status: DC | PRN
  Administered 2024-01-05: 2 via ORAL
  Administered 2024-01-05: 1 via ORAL
  Administered 2024-01-06: 2 via ORAL
  Filled 2024-01-05 (×3): qty 2

## 2024-01-05 MED ORDER — SODIUM CHLORIDE 0.9 % IV SOLN
2.0000 g | INTRAVENOUS | Status: AC
  Administered 2024-01-05: 2 g via INTRAVENOUS
  Filled 2024-01-05 (×2): qty 2

## 2024-01-05 NOTE — Progress Notes (Signed)
MD made aware of pt. 50 ml UOP after 500 ml given in PACU. Pt. Taking sips of water. Dr. Loreta Ave in at bedside now & speaking with pt. MD ordered bladder scan. MD discussed UOP situation with pt. : pt. To be 23 hr. OBS pt. Overnight. Pt. States his right upper lat. Chest "4" on scale 1-10 post procedure. MD aware. New orders received.

## 2024-01-05 NOTE — Anesthesia Procedure Notes (Signed)
Procedure Name: Intubation Date/Time: 01/05/2024 8:37 AM  Performed by: Monico Hoar, CRNAPre-anesthesia Checklist: Patient identified, Patient being monitored, Timeout performed, Emergency Drugs available and Suction available Patient Re-evaluated:Patient Re-evaluated prior to induction Oxygen Delivery Method: Circle system utilized Preoxygenation: Pre-oxygenation with 100% oxygen Induction Type: IV induction Ventilation: Mask ventilation without difficulty Laryngoscope Size: McGrath and 4 Grade View: Grade I Tube type: Oral Tube size: 7.5 mm Number of attempts: 1 Airway Equipment and Method: Stylet Placement Confirmation: ETT inserted through vocal cords under direct vision, positive ETCO2 and breath sounds checked- equal and bilateral Secured at: 21 cm Tube secured with: Tape Dental Injury: Teeth and Oropharynx as per pre-operative assessment

## 2024-01-05 NOTE — Progress Notes (Signed)
Interventional Radiology Progress Note  Post op check  78 yo male SP image guided ablation of 2 liver lesions  Awake, alert in recovery.   States his RUQ is painful, reports 4/10 pain.  TTP.   Has voided ~50cc after foley removal.   Requesting overnight observation.  I think reasonable to make sure he has his pain controlled and that we are safe for early recovery. Also, given his LUTS history, he may have difficulty urinating in early post-anesthesia period.   Recommendations:  - bladder scan now for residual.  Consider foley - overnight observation <23 hrs.  Med surg floor - Bedrest with bathroom assist - med rec complete.   - VIR on call aware.  Signed,  Yvone Neu. Loreta Ave, DO

## 2024-01-05 NOTE — Progress Notes (Signed)
Bladder scan being performed right now by tech. John with RN G. Delametter assist.

## 2024-01-05 NOTE — Procedures (Addendum)
Interventional Radiology Procedure Note  Procedure:   US/CT guided tissue ablation, microwave technology, of 2 separate right liver lesions.  Image guided biopsy of both lesions before treatment.   EBL: Minimal  Anesthesia: GETA  Findings: 2 separate right liver lesions, identified/confirmed with [place] and contrast CT  Specimen: #1 = deep lesion on posterior capsule #2 = lesion in the middle of right liver .  Complications: None  Recommendations:  - stable to PACU - anticipate 2 hr recovery then DC home when goals met:  street ready goals - advance diet - remove foley now - 500cc LR bolus in PACU after foley removal - ice prn - follow up path - Do not submerge for 7 days - Routine wound care - ok to restart all home meds, including aspirin tomorrow - follow up with Dr. Loreta Wall set, in ~4 weeks   Signed,  Luke Neu. Loreta Ave, DO

## 2024-01-05 NOTE — Anesthesia Preprocedure Evaluation (Signed)
Anesthesia Evaluation  Patient identified by MRN, date of birth, ID band Patient awake    Reviewed: Allergy & Precautions, H&P , NPO status , Patient's Chart, lab work & pertinent test results  History of Anesthesia Complications Negative for: history of anesthetic complications  Airway Mallampati: III  TM Distance: >3 FB Neck ROM: full    Dental  (+) Missing, Dental Advidsory Given, Poor Dentition   Pulmonary neg shortness of breath, sleep apnea , neg COPD, neg recent URI, former smoker   Pulmonary exam normal        Cardiovascular Exercise Tolerance: Good hypertension, (-) angina + CAD, + Past MI and + Peripheral Vascular Disease (4.1cm AAA)  Normal cardiovascular exam(-) dysrhythmias (-) Valvular Problems/Murmurs     Neuro/Psych negative neurological ROS  negative psych ROS   GI/Hepatic Neg liver ROS,GERD  Medicated and Controlled,,  Endo/Other  negative endocrine ROS    Renal/GU negative Renal ROS     Musculoskeletal  (+) Arthritis ,    Abdominal   Peds  Hematology negative hematology ROS (+)   Anesthesia Other Findings Past Medical History: No date: Angina pectoris (HCC) No date: Aortic atherosclerosis (HCC) No date: Ascending aorta dilation (HCC)     Comment:  a.) CT chest 11/04/2023: 4.1 cm No date: Cervicalgia No date: Cholelithiasis No date: Cirrhosis (HCC) No date: Coronary artery disease     Comment:  a.) s/p PTCA/POBA in the 1990s at [facility] -               details unclear; c.) CT chest 11/04/2023: 2v CAD No date: Diverticulosis No date: Full dentures No date: GERD (gastroesophageal reflux disease) No date: Heart murmur 2024: Hepatocellular carcinoma (HCC)     Comment:  a.) stage II (T2 N0 M0) No date: History of alcohol abuse 2019: History of bilateral cataract extraction 1960: History of blood transfusion     Comment:  a.) age 11; postoperatively following tonsillectomy No  date: History of kidney stones No date: Hypertension No date: Myocardial infarction (HCC) 09/16/2020: Osteoarthritis of left knee No date: Portal venous hypertension (HCC) No date: Rheumatoid arthritis (HCC) No date: Right inguinal hernia No date: Splenomegaly No date: Thrombocytopenia (HCC)   Reproductive/Obstetrics negative OB ROS                             Anesthesia Physical Anesthesia Plan  ASA: 3  Anesthesia Plan: General   Post-op Pain Management:    Induction: Intravenous  PONV Risk Score and Plan: 2 and Ondansetron, Dexamethasone and Treatment may vary due to age or medical condition  Airway Management Planned: Oral ETT  Additional Equipment:   Intra-op Plan:   Post-operative Plan: Extubation in OR  Informed Consent: I have reviewed the patients History and Physical, chart, labs and discussed the procedure including the risks, benefits and alternatives for the proposed anesthesia with the patient or authorized representative who has indicated his/her understanding and acceptance.     Dental Advisory Given  Plan Discussed with: Anesthesiologist, CRNA and Surgeon  Anesthesia Plan Comments:         Anesthesia Quick Evaluation

## 2024-01-05 NOTE — Transfer of Care (Signed)
Immediate Anesthesia Transfer of Care Note  Patient: Luke Wall  Procedure(s) Performed: CT BONE TUMOR(S)RF ABLATION CT LIVER MASS BIOPSY  Patient Location: PACU  Anesthesia Type:General  Level of Consciousness: awake and alert   Airway & Oxygen Therapy: Patient Spontanous Breathing and Patient connected to nasal cannula oxygen  Post-op Assessment: Report given to RN and Post -op Vital signs reviewed and stable  Post vital signs: Reviewed and stable  Last Vitals:  Vitals Value Taken Time  BP 188/76   Temp    Pulse 65   Resp 16   SpO2 98     Last Pain:  Vitals:   01/05/24 0738  TempSrc: Oral  PainSc: 0-No pain         Complications: No notable events documented.

## 2024-01-06 DIAGNOSIS — C220 Liver cell carcinoma: Secondary | ICD-10-CM | POA: Diagnosis not present

## 2024-01-06 LAB — BASIC METABOLIC PANEL
Anion gap: 9 (ref 5–15)
BUN: 23 mg/dL (ref 8–23)
CO2: 19 mmol/L — ABNORMAL LOW (ref 22–32)
Calcium: 9.2 mg/dL (ref 8.9–10.3)
Chloride: 110 mmol/L (ref 98–111)
Creatinine, Ser: 1.11 mg/dL (ref 0.61–1.24)
GFR, Estimated: >60 mL/min (ref >60)
Glucose, Bld: 121 mg/dL — ABNORMAL HIGH (ref 70–99)
Potassium: 4.3 mmol/L (ref 3.5–5.1)
Sodium: 138 mmol/L (ref 135–145)

## 2024-01-06 LAB — AFP TUMOR MARKER: AFP, Serum, Tumor Marker: 5.6 ng/mL (ref 0.0–8.4)

## 2024-01-06 MED ORDER — OXYCODONE-ACETAMINOPHEN 5-325 MG PO TABS: 1.0000 | ORAL_TABLET | Freq: Four times a day (QID) | ORAL | 0 refills | Status: DC | PRN

## 2024-01-06 MED ORDER — OXYCODONE-ACETAMINOPHEN 5-325 MG PO TABS
1.0000 | ORAL_TABLET | Freq: Once | ORAL | Status: AC
  Administered 2024-01-06: 1 via ORAL
  Filled 2024-01-06: qty 1

## 2024-01-06 NOTE — Plan of Care (Signed)

## 2024-01-06 NOTE — Discharge Summary (Signed)
Patient ID: Luke Wall MRN: [id number] DOB/AGE: May 14, 1946 78 y.o.  Admit date: 01/05/2024 Discharge date: 01/06/2024  Supervising Physician: Malachy Moan  Patient Status: [facility] - In-pt  Admission Diagnoses: Hepatocellular Carcinoma w/ Liver lesions  Discharge Diagnoses: Hepatocellular carcinoma w/ liver lesions s/p microwave ablation with biopsies   Discharged Condition: good  Hospital Course: 78 year old male with a history of alcohol use, thrombocytopenia, HTN, GERD, CAD, MI, nephrolithiasis s/p stent placement, portal HTN, and recent diagnosis of HCC w/ cirrhosis. Patient presented to [facility] as an outpatient on 01/05/24 for a CT guided liver lesion microwave ablation with 2 possible biopsies under general anesthesia. Patient tolerated the procedure well and reported 4/10 pain to RUQ post-procedure. Initially with difficulties urinating following foley removal; however, he was later able to void without issue (400cc total). Patient did request overnight observation d/t continued abdominal discomfort. Dr. Loreta Ave paged overnight d/t elevated BP and recommended conservative management.  Patient doing well this morning (01/06/24). Eating breakfast on arrival to room. BP 124/59. Afebrile. Continues to void spontaneously. Admits to continued RUQ pain. Has been taking hydrocodone 1-2 tablets q4hrs PRN for moderate pain; last dose 0620am this morning. Will give a dose of percocet while in the hospital to assess for better pain relief. Patient also with new rash to right hand following procedure. He denies any pain, itchiness, decreased sensation, or difficulty moving hand or digits. Suspect this is related to vascular compression form BP cuff, HTN, and arm position during case. Reviewed with Dr. Loreta Ave and feel safe to d/c home and follow up as needed.   Patient feels comfortable discharging home today and reaching out for any worsening RUQ or right hand pain. Wife coming to hospital to drive him  home. Discussed outpatient follow-up with patient. Patient verbalized understanding and all questions/concerns were answered at bedside.   Consults: None  Significant Diagnostic Studies: radiology: CT scan: Tissue ablation with microwave of 2 separate right sided liver lesions with biopsy of both lesions  FINDINGS: Sequential ultrasound and CT imaging demonstrates treatment of 2 separate lesions of the right liver.   The first lesion treated with 2 adjacent/parallel microwave antenna was on the deep and posterior margin of the liver. This corresponds to sample 1.   The second lesion treated with 2 adjacent/para micro antenna was within the central aspect of the liver. This corresponds to sample 2.   Contrast-enhanced CT during the case demonstrates adequate positioning of both biopsy needle in the proximal margin of the targeted lesions.   Final CT image demonstrates expected changes of microwave ablation within the central liver and deep liver. Additionally there is small volume of expected heme products on the margin of the liver at segment 6.   Minor atelectasis at the lung bases.  No complicating features.   IMPRESSION: Status post image guided tissue ablation of 2 right-sided liver lesions, microwave technology, as well as separate image guided biopsy of both lesions.  Treatments: analgesia: Hydrocodone-acetaminophen 5-325mg  1-2 tablets q4hrs PRN for moderate pain  Discharge Exam: Blood pressure (!) 124/59, pulse 63, temperature 97.9 F (36.6 C), resp. rate 16, height 5\' 7"  (1.702 m), weight 183 lb 4.8 oz (83.1 kg), SpO2 97%. General appearance: alert, cooperative, and no distress Head: Normocephalic, without obvious abnormality, atraumatic Neck: no adenopathy, no JVD, and supple, symmetrical, trachea midline Resp: no increased work of breathing; on room air  Cardio: regular rate and rhythm GI: soft, non-distended, minimally tender to RUQ Extremities: moves all  extremities, atraumatic, no cyanosis or  edema Skin: warm and dry; right hand with petechial rash to dorsal aspect of hand and digits Pulses: 2+ throughout  Neurologic: Alert and oriented X 3, normal strength and tone. Right hand with full ROM and sensation  Incision/Wound:  Disposition: Discharge disposition: 01-Home or Self Care       Discharge Instructions     Discharge patient   Complete by: As directed    Discharge disposition: 01-Home or Self Care   Discharge patient date: 01/06/2024      Allergies as of 01/06/2024       Reactions   Tramadol Other (See Comments)   Gi distress        Medication List     TAKE these medications    acetaminophen 500 MG tablet Commonly known as: TYLENOL Take 1,000 mg by mouth every 6 (six) hours as needed for moderate pain.   albuterol 108 (90 Base) MCG/ACT inhaler Commonly known as: VENTOLIN HFA Inhale into the lungs.   aspirin EC 81 MG tablet Take 81 mg by mouth every morning. Swallow whole.   atenolol 50 MG tablet Commonly known as: TENORMIN Take 50 mg by mouth every morning.   diazepam 5 MG tablet Commonly known as: VALIUM   omeprazole 20 MG capsule Commonly known as: PRILOSEC Take 20 mg by mouth every morning.   oxyCODONE-acetaminophen 5-325 MG tablet Commonly known as: Percocet Take 1 tablet by mouth every 6 (six) hours as needed for severe pain (pain score 7-10).   rosuvastatin 10 MG tablet Commonly known as: CRESTOR Take 1 tablet by mouth every morning.   tamsulosin 0.4 MG Caps capsule Commonly known as: FLOMAX Take 1 capsule (0.4 mg total) by mouth daily after breakfast.        Follow-up Information     [facility] Follow up.   Why: IR scheduler will call you for follow up appointment Contact information: [address] [place] [address] [phone]                  Electronically Signed: Jama Flavors, PA-C 01/06/2024, 11:03 AM   I have spent  Less Than 30 Minutes discharging Luke Wall.

## 2024-01-06 NOTE — Anesthesia Postprocedure Evaluation (Signed)
Anesthesia Post Note  Patient: Luke Wall  Procedure(s) Performed: CT BONE TUMOR(S)RF ABLATION CT LIVER MASS BIOPSY  Patient location during evaluation: PACU Anesthesia Type: General Level of consciousness: awake and alert Pain management: pain level controlled Vital Signs Assessment: post-procedure vital signs reviewed and stable Respiratory status: spontaneous breathing, nonlabored ventilation, respiratory function stable and patient connected to nasal cannula oxygen Cardiovascular status: blood pressure returned to baseline and stable Postop Assessment: no apparent nausea or vomiting Anesthetic complications: no   No notable events documented.   Last Vitals:  Vitals:   01/06/24 0520 01/06/24 0804  BP: (!) 142/64 (!) 124/59  Pulse: 61 63  Resp: 18 16  Temp: 36.5 C 36.6 C  SpO2: 99% 97%    Last Pain:  Vitals:   01/06/24 1005  TempSrc:   PainSc: 6                  Lenard Simmer

## 2024-01-07 ENCOUNTER — Telehealth: Payer: Self-pay

## 2024-01-07 DIAGNOSIS — C220 Liver cell carcinoma: Secondary | ICD-10-CM

## 2024-01-07 LAB — TYPE AND SCREEN
ABO/RH(D): AB POS
Antibody Screen: POSITIVE
Unit division: 0
Unit division: 0

## 2024-01-07 LAB — BPAM RBC
Blood Product Expiration Date: 202503142359
Blood Product Expiration Date: 202503152359
Unit Type and Rh: 5100
Unit Type and Rh: 5100

## 2024-01-07 LAB — SURGICAL PATHOLOGY

## 2024-01-07 NOTE — Telephone Encounter (Signed)
Called and spoke with Luke Wall. He is still having a little abdominal soreness from his ablation. He is happy that his hernia repair has been scheduled. Dr. Smith Robert will see him back in around 8 weeks with an MRI. This will be scheduled and he will be notified.

## 2024-02-03 ENCOUNTER — Ambulatory Visit
Admission: RE | Admit: 2024-02-03 | Discharge: 2024-02-03 | Disposition: A | Discharge status: Home / Self Care | Payer: Medicare HMO | Source: Ambulatory Visit | Attending: Radiology | Admitting: Radiology

## 2024-02-03 DIAGNOSIS — C220 Liver cell carcinoma: Secondary | ICD-10-CM

## 2024-02-03 HISTORY — PX: IR RADIOLOGIST EVAL & MGMT: IMG5224

## 2024-02-03 NOTE — Progress Notes (Signed)
 Chief Complaint: SP liver lesion ablation x 2   Referring Physician(s): Rao,Archana C   Urology: Dr. Lonna Cobb   History of Present Illness: Luke Wall is a 78 y.o. male presenting to [facility] clinic as a scheduled follow up, SP recent liver directed therapy for liver lesions.    Luke Wall joins [place] today with his wife, Bonita Quin, for the interview via telemedicine visit.  We confirmed with 2 personal identifiers.    History (erecord) Liver lesions were found incidentally on CT imaging (07/10/23) that was done for abd pain.  He was treated for nephrolithiasis, ultimately requiring stents, which were removed in the OR in October. His follow up visit for Urology noted a hernia, and he was referred to the ED 10/07/23 and then to [facility].     MRI was completed to evaluate the liver lesions, given his cirrhotic morphology.  The MRI 10/10/23 notes that he has 2 right side liver lesions, LIRADS-5 both.      AFP is 4.3 on 12/3.    Reported that he did "have a good time" in his younger years.  He has given up drinking and smoking for many years.  He tells me he has received his Hep A/Hep B vaccines.     He is active at home, doing everything he needs to comfortably.  He works in the yard.  He is able to walk up and down stairs comfortably.  He says years ago he was told he had MI, but uncertain when.  He denies stroke or resting chest pain of concern. He is taking 81mg  ASA, lipid medication, HTN medication.  He is not diabetic.  Does not require home O2.    He says he has appointment for GI/upper endo December 8th.  He tells me that he has "completed" his colonoscopy" and was told he does not need them anymore.    He denies any episodes of ascites or variceal bleeding.    Interval History: We treated Luke Eastridge 01/05/24 with CT guided ablation of 2 separate lesions with microwave ablation, under GETA.    He was discharged the morning after the procedure after 23 hour observation at [facility].    He tells me that since our procedure, he has gone to [facility] for hernia repair with surgery team, and that went well.  He is feeling great, with decreasd pain from the hernia, and having completely healed from our ablation.   He is doing all of his ADL's comfortably.   Past Medical History:  Diagnosis Date   Angina pectoris (HCC)    Aortic atherosclerosis (HCC)    Ascending aorta dilation (HCC)    a.) CT chest 11/04/2023: 4.1 cm   Cervicalgia    Cholelithiasis    Cirrhosis (HCC)    Coronary artery disease    a.) s/p PTCA/POBA in the 1990s at [facility] - details unclear; c.) CT chest 11/04/2023: 2v CAD   Diverticulosis    Full dentures    GERD (gastroesophageal reflux disease)    Heart murmur    Hepatocellular carcinoma (HCC) 2024   a.) stage II (T2 N0 M0)   History of alcohol abuse    History of bilateral cataract extraction 2019   History of blood transfusion 1960   a.) age 20; postoperatively following tonsillectomy   History of kidney stones    Hypertension    Myocardial infarction [facility])    Osteoarthritis of left knee 09/16/2020   Portal venous hypertension (HCC)  Rheumatoid arthritis (HCC)    Right inguinal hernia    Splenomegaly    Thrombocytopenia (HCC)     Past Surgical History:  Procedure Laterality Date   CARDIAC CATHETERIZATION  1990's   POBA   CATARACT EXTRACTION W/ INTRAOCULAR LENS  IMPLANT, BILATERAL Bilateral 2019   COLONOSCOPY  2018   CYSTOSCOPY W/ URETERAL STENT REMOVAL Bilateral 08/25/2023   Procedure: CYSTOSCOPY WITH STENT REMOVAL;  Surgeon: Riki Altes, MD;  Location: [facility] ORS;  Service: Urology;  Laterality: Bilateral;   CYSTOSCOPY/URETEROSCOPY/HOLMIUM LASER/STENT PLACEMENT Bilateral 08/04/2023   Procedure: CYSTOSCOPY/URETEROSCOPY/HOLMIUM LASER/STENT PLACEMENT;  Surgeon: Riki Altes, MD;  Location: [facility] ORS;  Service: Urology;  Laterality: Bilateral;   ESOPHAGOGASTRODUODENOSCOPY (EGD) WITH PROPOFOL N/A 12/30/2023   Procedure:  ESOPHAGOGASTRODUODENOSCOPY (EGD) WITH PROPOFOL;  Surgeon: Toledo, Boykin Nearing, MD;  Location: [facility] ENDOSCOPY;  Service: Gastroenterology;  Laterality: N/A;   IR RADIOLOGIST EVAL & MGMT  11/03/2023   KNEE ARTHROPLASTY Left 02/08/2021   Procedure: COMPUTER ASSISTED TOTAL KNEE ARTHROPLASTY - RNFA;  Surgeon: Donato Heinz, MD;  Location: [facility] ORS;  Service: Orthopedics;  Laterality: Left;   KNEE ARTHROPLASTY Right 06/17/2021   Procedure: COMPUTER ASSISTED TOTAL KNEE ARTHROPLASTY;  Surgeon: Donato Heinz, MD;  Location: [facility] ORS;  Service: Orthopedics;  Laterality: Right;   KNEE ARTHROSCOPY Left 1976   TONSILLECTOMY  1960    Allergies: Tramadol  Medications: Prior to Admission medications   Medication Sig Start Date End Date Taking? Authorizing Provider  acetaminophen (TYLENOL) 500 MG tablet Take 1,000 mg by mouth every 6 (six) hours as needed for moderate pain.    [provider]  albuterol (VENTOLIN HFA) 108 (90 Base) MCG/ACT inhaler Inhale into the lungs. 08/04/23   [provider]  aspirin EC 81 MG tablet Take 81 mg by mouth every morning. Swallow whole.    [provider]  atenolol (TENORMIN) 50 MG tablet Take 50 mg by mouth every morning.    [provider]  diazepam (VALIUM) 5 MG tablet  10/13/23   [provider]  omeprazole (PRILOSEC) 20 MG capsule Take 20 mg by mouth every morning.    [provider]  oxyCODONE-acetaminophen (PERCOCET) 5-325 MG tablet Take 1 tablet by mouth every 6 (six) hours as needed for severe pain (pain score 7-10). 01/06/24   Lynnette Caffey A, PA-C  rosuvastatin (CRESTOR) 10 MG tablet Take 1 tablet by mouth every morning. 07/08/23   [provider]  tamsulosin (FLOMAX) 0.4 MG CAPS capsule Take 1 capsule (0.4 mg total) by mouth daily after breakfast. 08/25/23   Stoioff, Verna Czech, MD     Family History  Family history unknown: Yes    Social History   Socioeconomic History   Marital status:  Married    Spouse name: Bonita Quin   Number of children: 1   Years of education: Not on file   Highest education level: Not on file  Occupational History   Not on file  Tobacco Use   Smoking status: Former    Current packs/day: 0.50    Average packs/day: 0.5 packs/day for 11.2 years (5.6 ttl pk-yrs)    Types: Cigarettes    Start date: 2000    Passive exposure: Past   Smokeless tobacco: Never  Vaping Use   Vaping status: Never Used  Substance and Sexual Activity   Alcohol use: Not Currently   Drug use: Never   Sexual activity: Not on file  Other Topics Concern   Not on file  Social History Narrative  Lives with wife at home. Pets(dog) at home   Social Drivers of Health   Financial Resource Strain: Patient Declined (10/06/2023)   Received from [facility]   Overall Financial Resource Strain (CARDIA)    Difficulty of Paying Living Expenses: Patient declined  Food Insecurity: No Food Insecurity (01/06/2024)   Hunger Vital Sign    Worried About Running Out of Food in the Last Year: Never true    Ran Out of Food in the Last Year: Never true  Transportation Needs: No Transportation Needs (01/22/2024)   Received from [facility] - Transportation    In the past 12 months, has lack of transportation kept you from medical appointments or from getting medications?: No    Lack of Transportation (Non-Medical): No  Physical Activity: Not on file  Stress: Not on file  Social Connections: Moderately Integrated (01/06/2024)   Social Connection and Isolation Panel [NHANES]    Frequency of Communication with Friends and Family: More than three times a week    Frequency of Social Gatherings with Friends and Family: Once a week    Attends Religious Services: More than 4 times per year    Active Member of [employer] or Organizations: No    Attends Banker Meetings: Never    Marital Status: Married      Review of Systems  Review of  Systems: A 12 point ROS discussed and pertinent positives are indicated in the HPI above.  All other systems are negative.       Physical Exam No direct physical exam was performed (except for noted visual exam findings with Video Visits).    Vital Signs: There were no vitals taken for this visit.  Imaging: CT BONE TUMOR(S)RF ABLATION Result Date: 01/05/2024 INDICATION: 78 year old male with imaging diagnosis of 2 right liver HCC presents for ablation therapy EXAM: ULTRASOUND AND CT GUIDED TISSUE ABLATION WITH MICROWAVE OF 2 SEPARATE RIGHT-SIDED LIVER LESIONS IMAGE GUIDED BIOPSY OF BOTH LIVER LESIONS COMPARISON:  10/20/2023 MEDICATIONS: Rocephin 1 gm IV; The antibiotic was administered in an appropriate time interval prior to needle puncture of the skin. ANESTHESIA/SEDATION: General - as administered by the Anesthesia department The CR n/a assisted in monitoring the patients level of consciousness and vital signs continuously throughout the procedure under my direct supervision. FLUOROSCOPY: CT COMPLICATIONS: None TECHNIQUE: Informed written consent was obtained from the patient after a thorough discussion of the procedural risks, benefits and alternatives. All questions were addressed. Maximal Sterile Barrier Technique was utilized including caps, mask, sterile gowns, sterile gloves, sterile drape, hand hygiene and skin antiseptic. A timeout was performed prior to the initiation of the procedure. Patient was brought to the CT suite, properly identified. The anesthesia team was present for induction of anesthesia. The patient was then positioned supine, slightly right anterior oblique on the CT gantry table. Scout CT acquired for planning purposes. Ultrasound images were acquired for planning purposes. The patient was then prepped and draped in the usual sterile fashion. Using ultrasound guidance, a 15 cm, 17 gauge biopsy guide needle was advanced to the lesion in the center of the right liver. Under  ultrasound guidance, a 20 cm, 17 gauge biopsy guide needle was advanced to the lesion in the deep and posterior aspect of the liver. Repeat CT was acquired. After review of the images we were confident that both of these biopsy guide needles were on the proximal margin of the lesions of interest. We elected to treat the deep  lesion first, so that we would preserve a sonographic window for the superficial lesion when this would be treated second. Using a combination of ultrasound and CT serial imaging, 2 separate 17 gauge 20 cm new wave PR microwave antenna were positioned within the deep lesion on the posterior aspect of the liver. During the positioning of these 2 lesions, a dual phase CT was acquired for positioning. Review of the dual phase CT imaging confirmed that the biopsy needle was on the proximal margin of the deep lesion, and the biopsy needle was in the proximal margin of the more central lesion. Both of these were adequately position for biopsy. On this contrast CT, it was clear that the 2 microwave probes needed repositioning. Additional sequential CT images were used with ultrasound assistance to reposition the tandem microwave probes into the deep lesion. Once we confirmed the microwave probes in adequate location, treatment was performed. Treatment was performed with both microwave antenna 10 minutes, 65 watts. After treatment CT was acquired. The micro event tendon were removed with cautery. Ultrasound guidance was used then to position these 17 gauge 20 cm micro eighth antenna into the more central lesion. Once we were satisfied using both CT and ultrasound positioning, the more central lesion was treated. 10 minutes, 65 watts. Upon completion the PR probes were removed with cautery and a final CT was acquired. Patient remained hemodynamically stable throughout. No complications were encountered. FINDINGS: Sequential ultrasound and CT imaging demonstrates treatment of 2 separate lesions of the  right liver. The first lesion treated with 2 adjacent/parallel microwave antenna was on the deep and posterior margin of the liver. This corresponds to sample 1. The second lesion treated with 2 adjacent/para micro antenna was within the central aspect of the liver. This corresponds to sample 2. Contrast-enhanced CT during the case demonstrates adequate positioning of both biopsy needle in the proximal margin of the targeted lesions. Final CT image demonstrates expected changes of microwave ablation within the central liver and deep liver. Additionally there is small volume of expected heme products on the margin of the liver at segment 6. Minor atelectasis at the lung bases.  No complicating features. IMPRESSION: Status post image guided tissue ablation of 2 right-sided liver lesions, microwave technology, as well as separate image guided biopsy of both lesions. Signed, Yvone Neu. Miachel Roux, RPVI [facility] Radiology Electronically Signed   By: Gilmer Mor D.O.   On: 01/05/2024 15:28   CT LIVER MASS BIOPSY Result Date: 01/05/2024 INDICATION: 78 year old male with imaging diagnosis of 2 right liver HCC presents for ablation therapy EXAM: ULTRASOUND AND CT GUIDED TISSUE ABLATION WITH MICROWAVE OF 2 SEPARATE RIGHT-SIDED LIVER LESIONS IMAGE GUIDED BIOPSY OF BOTH LIVER LESIONS COMPARISON:  10/20/2023 MEDICATIONS: Rocephin 1 gm IV; The antibiotic was administered in an appropriate time interval prior to needle puncture of the skin. ANESTHESIA/SEDATION: General - as administered by the Anesthesia department The CR n/a assisted in monitoring the patients level of consciousness and vital signs continuously throughout the procedure under my direct supervision. FLUOROSCOPY: CT COMPLICATIONS: None TECHNIQUE: Informed written consent was obtained from the patient after a thorough discussion of the procedural risks, benefits and alternatives. All questions were  addressed. Maximal Sterile Barrier Technique was utilized including caps, mask, sterile gowns, sterile gloves, sterile drape, hand hygiene and skin antiseptic. A timeout was performed prior to the initiation of the procedure. Patient was brought to the CT suite, properly identified. The anesthesia team was present for induction  of anesthesia. The patient was then positioned supine, slightly right anterior oblique on the CT gantry table. Scout CT acquired for planning purposes. Ultrasound images were acquired for planning purposes. The patient was then prepped and draped in the usual sterile fashion. Using ultrasound guidance, a 15 cm, 17 gauge biopsy guide needle was advanced to the lesion in the center of the right liver. Under ultrasound guidance, a 20 cm, 17 gauge biopsy guide needle was advanced to the lesion in the deep and posterior aspect of the liver. Repeat CT was acquired. After review of the images we were confident that both of these biopsy guide needles were on the proximal margin of the lesions of interest. We elected to treat the deep lesion first, so that we would preserve a sonographic window for the superficial lesion when this would be treated second. Using a combination of ultrasound and CT serial imaging, 2 separate 17 gauge 20 cm new wave PR microwave antenna were positioned within the deep lesion on the posterior aspect of the liver. During the positioning of these 2 lesions, a dual phase CT was acquired for positioning. Review of the dual phase CT imaging confirmed that the biopsy needle was on the proximal margin of the deep lesion, and the biopsy needle was in the proximal margin of the more central lesion. Both of these were adequately position for biopsy. On this contrast CT, it was clear that the 2 microwave probes needed repositioning. Additional sequential CT images were used with ultrasound assistance to reposition the tandem microwave probes into the deep lesion. Once we confirmed  the microwave probes in adequate location, treatment was performed. Treatment was performed with both microwave antenna 10 minutes, 65 watts. After treatment CT was acquired. The micro event tendon were removed with cautery. Ultrasound guidance was used then to position these 17 gauge 20 cm micro eighth antenna into the more central lesion. Once we were satisfied using both CT and ultrasound positioning, the more central lesion was treated. 10 minutes, 65 watts. Upon completion the PR probes were removed with cautery and a final CT was acquired. Patient remained hemodynamically stable throughout. No complications were encountered. FINDINGS: Sequential ultrasound and CT imaging demonstrates treatment of 2 separate lesions of the right liver. The first lesion treated with 2 adjacent/parallel microwave antenna was on the deep and posterior margin of the liver. This corresponds to sample 1. The second lesion treated with 2 adjacent/para micro antenna was within the central aspect of the liver. This corresponds to sample 2. Contrast-enhanced CT during the case demonstrates adequate positioning of both biopsy needle in the proximal margin of the targeted lesions. Final CT image demonstrates expected changes of microwave ablation within the central liver and deep liver. Additionally there is small volume of expected heme products on the margin of the liver at segment 6. Minor atelectasis at the lung bases.  No complicating features. IMPRESSION: Status post image guided tissue ablation of 2 right-sided liver lesions, microwave technology, as well as separate image guided biopsy of both lesions. Signed, Yvone Neu. Miachel Roux, RPVI [facility] Inter[facility] Radiology Special[facility] Radiology Electronically Signed   By: Gilmer Mor D.O.   On: 01/05/2024 15:28   CT LIVER MASS BIOPSY Result Date: 01/05/2024 INDICATION: 78 year old male with imaging diagnosis of 2 right liver HCC presents for ablation therapy  EXAM: ULTRASOUND AND CT GUIDED TISSUE ABLATION WITH MICROWAVE OF 2 SEPARATE RIGHT-SIDED LIVER LESIONS IMAGE GUIDED BIOPSY OF BOTH LIVER LESIONS COMPARISON:  10/20/2023 MEDICATIONS: Rocephin  1 gm IV; The antibiotic was administered in an appropriate time interval prior to needle puncture of the skin. ANESTHESIA/SEDATION: General - as administered by the Anesthesia department The CR n/a assisted in monitoring the patients level of consciousness and vital signs continuously throughout the procedure under my direct supervision. FLUOROSCOPY: CT COMPLICATIONS: None TECHNIQUE: Informed written consent was obtained from the patient after a thorough discussion of the procedural risks, benefits and alternatives. All questions were addressed. Maximal Sterile Barrier Technique was utilized including caps, mask, sterile gowns, sterile gloves, sterile drape, hand hygiene and skin antiseptic. A timeout was performed prior to the initiation of the procedure. Patient was brought to the CT suite, properly identified. The anesthesia team was present for induction of anesthesia. The patient was then positioned supine, slightly right anterior oblique on the CT gantry table. Scout CT acquired for planning purposes. Ultrasound images were acquired for planning purposes. The patient was then prepped and draped in the usual sterile fashion. Using ultrasound guidance, a 15 cm, 17 gauge biopsy guide needle was advanced to the lesion in the center of the right liver. Under ultrasound guidance, a 20 cm, 17 gauge biopsy guide needle was advanced to the lesion in the deep and posterior aspect of the liver. Repeat CT was acquired. After review of the images we were confident that both of these biopsy guide needles were on the proximal margin of the lesions of interest. We elected to treat the deep lesion first, so that we would preserve a sonographic window for the superficial lesion when this would be treated second. Using a combination of  ultrasound and CT serial imaging, 2 separate 17 gauge 20 cm new wave PR microwave antenna were positioned within the deep lesion on the posterior aspect of the liver. During the positioning of these 2 lesions, a dual phase CT was acquired for positioning. Review of the dual phase CT imaging confirmed that the biopsy needle was on the proximal margin of the deep lesion, and the biopsy needle was in the proximal margin of the more central lesion. Both of these were adequately position for biopsy. On this contrast CT, it was clear that the 2 microwave probes needed repositioning. Additional sequential CT images were used with ultrasound assistance to reposition the tandem microwave probes into the deep lesion. Once we confirmed the microwave probes in adequate location, treatment was performed. Treatment was performed with both microwave antenna 10 minutes, 65 watts. After treatment CT was acquired. The micro event tendon were removed with cautery. Ultrasound guidance was used then to position these 17 gauge 20 cm micro eighth antenna into the more central lesion. Once we were satisfied using both CT and ultrasound positioning, the more central lesion was treated. 10 minutes, 65 watts. Upon completion the PR probes were removed with cautery and a final CT was acquired. Patient remained hemodynamically stable throughout. No complications were encountered. FINDINGS: Sequential ultrasound and CT imaging demonstrates treatment of 2 separate lesions of the right liver. The first lesion treated with 2 adjacent/parallel microwave antenna was on the deep and posterior margin of the liver. This corresponds to sample 1. The second lesion treated with 2 adjacent/para micro antenna was within the central aspect of the liver. This corresponds to sample 2. Contrast-enhanced CT during the case demonstrates adequate positioning of both biopsy needle in the proximal margin of the targeted lesions. Final CT image demonstrates expected  changes of microwave ablation within the central liver and deep liver. Additionally there is small volume of  expected heme products on the margin of the liver at segment 6. Minor atelectasis at the lung bases.  No complicating features. IMPRESSION: Status post image guided tissue ablation of 2 right-sided liver lesions, microwave technology, as well as separate image guided biopsy of both lesions. Signed, Yvone Neu. Miachel Roux, RPVI [facility] Interventional Radiology Specialists [facility] Radiology Electronically Signed   By: Gilmer Mor D.O.   On: 01/05/2024 15:28    Labs:  CBC: Recent Labs    08/24/23 0915 10/27/23 1251 12/24/23 1307 01/05/24 0747  WBC 5.3 4.5 4.0 4.4  HGB 14.3 13.4 14.2 14.3  HCT 39.9 39.2 41.1 40.1  PLT 88* 71* 77* 73*    COAGS: Recent Labs    10/27/23 1251 12/24/23 1307 01/05/24 0745  INR 1.3* 1.2 1.3*    BMP: Recent Labs    10/27/23 1251 12/24/23 1307 01/05/24 0747 01/06/24 0509  NA 137 142 138 138  K 4.1 3.5 3.2* 4.3  CL 108 109 108 110  CO2 19* 24 18* 19*  GLUCOSE 100* 141* 94 121*  BUN 16 15 14 23   CALCIUM 9.0 9.1 9.3 9.2  CREATININE 0.97 1.04 1.00 1.11  GFRNONAA >60 >60 >60 >60    LIVER FUNCTION TESTS: Recent Labs    10/27/23 1251 12/24/23 1307 01/05/24 0747  BILITOT 1.7* 2.3* 2.3*  AST 44* 40 46*  ALT 27 26 24   ALKPHOS 57 57 57  PROT 6.5 6.9 7.1  ALBUMIN 3.1* 3.4* 3.4*    TUMOR MARKERS: No results for input(s): "AFPTM", "CEA", "CA199", "CHROMGRNA" in the last 8760 hours.  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    He has recovered completely from the procedure, and has also successfully undergone hernia repair in the last few weeks with [facility] surgery.    Today we discussed surveillance strategy, with initial imaging about 3 months out.   Plan: - 3 month interval office visit with contrast enhanced abd/pelvis CT, and  AFP lab, with Dr. Loreta Ave     Electronically Signed: Gilmer Mor 02/03/2024, 8:29 AM   I spent a total of    25 Minutes in remote  clinical consultation, greater than 50% of which was counseling/coordinating care for SP ablation of 2 liver HCC.    Visit type: Audio and video (EPIC telemedicine video).   Alternative for in-person consultation at [facility], [address] [place], [place], [place]. This visit type was conducted due to national recommendations for restrictions regarding the COVID-19 Pandemic (e.g. social distancing).  This format is felt to be most appropriate for this patient at this time.  All issues noted in this document were discussed and addressed.

## 2024-03-01 ENCOUNTER — Ambulatory Visit
Admission: RE | Admit: 2024-03-01 | Discharge: 2024-03-01 | Disposition: A | Discharge status: Home / Self Care | Payer: Medicare HMO | Source: Ambulatory Visit | Attending: Oncology | Admitting: Oncology

## 2024-03-01 DIAGNOSIS — C220 Liver cell carcinoma: Secondary | ICD-10-CM | POA: Insufficient documentation

## 2024-03-01 MED ORDER — GADOBUTROL 1 MMOL/ML IV SOLN
7.5000 mL | Freq: Once | INTRAVENOUS | Status: AC | PRN
  Administered 2024-03-01: 7.5 mL via INTRAVENOUS

## 2024-03-16 ENCOUNTER — Encounter: Payer: Self-pay | Admitting: Oncology

## 2024-03-16 ENCOUNTER — Inpatient Hospital Stay: Payer: Medicare HMO | Attending: Oncology | Admitting: Oncology

## 2024-03-16 VITALS — BP 137/65 | HR 62 | Temp 98.0°F | Resp 18 | Ht 67.0 in | Wt 179.7 lb

## 2024-03-16 DIAGNOSIS — Z08 Encounter for follow-up examination after completed treatment for malignant neoplasm: Secondary | ICD-10-CM

## 2024-03-16 DIAGNOSIS — C220 Liver cell carcinoma: Secondary | ICD-10-CM | POA: Insufficient documentation

## 2024-03-16 DIAGNOSIS — D6959 Other secondary thrombocytopenia: Secondary | ICD-10-CM | POA: Diagnosis not present

## 2024-03-16 DIAGNOSIS — Z8505 Personal history of malignant neoplasm of liver: Secondary | ICD-10-CM | POA: Diagnosis not present

## 2024-03-16 NOTE — Addendum Note (Signed)
 Addended by: Marilyn Shropshire on: 03/16/2024 01:14 PM   Modules accepted: Orders

## 2024-03-16 NOTE — Progress Notes (Signed)
 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 Rosea Conch for unilateral right inguinal hernia. He underwent CT abdomen which showed possible cirrhosis as well as indeterminate 2.2 cm lesion in the right hepatic lobe along with bilateral kidney stones with mild right hydronephrosis and proximal hydroureter and mild splenomegaly. This was followed by MRI liver with and without contrast which showed cirrhotic changes in the liver with portal venous hypertension, portal venous collaterals splenomegaly and ascites. He was noted to have 2 lesions 1 involving the anterior aspect of segment 6 measuring 2 cm characterized as LI-RADS 5. There was another 2.3 x 2.2 cm lesion in segment 6 which was also characterized as LI-RADS 5.    Given his cirrhosis portal hypertension thrombocytopenia and ascites he is unlikely to be a candidate for definitive resection.  He is following up with Dr. Alene Husk from [facility] transplant.  He was also evaluated by [facility] to workup his cirrhosis.  Patient underwent microwave ablation toHis liver lesions in February 2025.  Patient underwent inguinal hernia repair at [facility] as well    Interval history-presently reports some mild right groin pain but overall has improved.  He is doing well post microwave ablation to his liver  ECOG PS- 2 Pain scale- 0   Review of systems- Review of Systems  Constitutional:  Positive for malaise/fatigue. Negative for chills, fever and weight  loss.  HENT:  Negative for congestion, ear discharge and nosebleeds.   Eyes:  Negative for blurred vision.  Respiratory:  Negative for cough, hemoptysis, sputum production, shortness of breath and wheezing.   Cardiovascular:  Negative for chest pain, palpitations, orthopnea and claudication.  Gastrointestinal:  Negative for abdominal pain, blood in stool, constipation, diarrhea, heartburn, melena, nausea and vomiting.  Genitourinary:  Negative for dysuria, flank pain, frequency, hematuria and urgency.  Musculoskeletal:  Negative for back pain, joint pain and myalgias.  Skin:  Negative for rash.  Neurological:  Negative for dizziness, tingling, focal weakness, seizures, weakness and headaches.  Endo/Heme/Allergies:  Does not bruise/bleed easily.  Psychiatric/Behavioral:  Negative for depression and suicidal ideas. The patient does not have insomnia.       Allergies  Allergen Reactions   Tramadol  Other (See Comments)    Gi distress     Past Medical History:  Diagnosis Date   Angina pectoris (HCC)    Aortic atherosclerosis (HCC)    Ascending aorta dilation (HCC)    a.) CT chest 11/04/2023: 4.1 cm   Cervicalgia    Cholelithiasis    Cirrhosis (HCC)    Coronary artery disease    a.) s/p PTCA/POBA in the 1990s at [facility] - details unclear; c.) CT chest 11/04/2023: 2v CAD   Diverticulosis    Full dentures    GERD (gastroesophageal reflux disease)    Heart murmur    Hepatocellular carcinoma (HCC) 2024   a.) stage II (T2 N0 M0)   History of alcohol abuse    History of bilateral cataract extraction 2019   History of blood transfusion  1960   a.) age 50; postoperatively following tonsillectomy   History of kidney stones    Hypertension    Myocardial infarction [facility])    Osteoarthritis of left knee 09/16/2020   Portal venous hypertension (HCC)    Rheumatoid arthritis (HCC)    Right inguinal hernia    Splenomegaly    Thrombocytopenia (HCC)      Past Surgical  History:  Procedure Laterality Date   CARDIAC CATHETERIZATION  1990's   POBA   CATARACT EXTRACTION W/ INTRAOCULAR LENS  IMPLANT, BILATERAL Bilateral 2019   COLONOSCOPY  2018   CYSTOSCOPY W/ URETERAL STENT REMOVAL Bilateral 08/25/2023   Procedure: CYSTOSCOPY WITH STENT REMOVAL;  Surgeon: Geraline Knapp, MD;  Location: [facility] ORS;  Service: Urology;  Laterality: Bilateral;   CYSTOSCOPY/URETEROSCOPY/HOLMIUM LASER/STENT PLACEMENT Bilateral 08/04/2023   Procedure: CYSTOSCOPY/URETEROSCOPY/HOLMIUM LASER/STENT PLACEMENT;  Surgeon: Geraline Knapp, MD;  Location: [facility] OR[facility];  Service: Urology;  Laterality: Bilateral;   ESOPHAGOGASTRODUODENOSCOPY (EGD) WITH PROPOFOL  N/A 12/30/2023   Procedure: ESOPHAGOGASTRODUODENOSCOPY (EGD) WITH PROPOFOL ;  Surgeon: Toledo, Alphonsus Jeans, MD;  Location: [facility] END[facility]COPY;  Service: Gastroenterology;  Laterality: N/A;   IR RADIOLOGIST EVAL & MGMT  11/03/2023   IR RADIOLOGIST EVAL & MGMT  02/03/2024   KNEE ARTHROPLASTY Left 02/08/2021   Procedure: COMPUTER ASSISTED TOTAL KNEE ARTHROPLASTY - RNFA;  Surgeon: Arlyne Lame, MD;  Location: [facility] OR[facility];  Service: Orthopedics;  Laterality: Left;   KNEE ARTHROPLASTY Right 06/17/2021   Procedure: COMPUTER ASSISTED TOTAL KNEE ARTHROPLASTY;  Surgeon: Arlyne Lame, MD;  Location: [facility] OR[facility];  Service: Orthopedics;  Laterality: Right;   KNEE ARTHROSCOPY Left 1976   TONSILLECTOMY  1960    Social History   Socioeconomic History   Marital status: Married    Spouse name: Stana Ear   Number of children: 1   Years of education: Not on file   Highest education level: Not on file  Occupational History   Not on file  Tobacco Use   Smoking status: Former    Current packs/day: 0.50    Average packs/day: 0.5 packs/day for 11.3 years (5.7 ttl pk-yrs)    Types: Cigarettes    Start date: 2000    Passive exposure: Past   Smokeless tobacco: Never  Vaping Use   Vaping status: Never Used  Substance and Sexual Activity   Alcohol use: Not  Currently   Drug use: Never   Sexual activity: Not on file  Other Topics Concern   Not on file  Social History Narrative   Lives with wife at home. Pets(dog) at home   Social Drivers of Health   Financial Resource Strain: Patient Declined (10/06/2023)   [facility]   Overall Financial Resource Strain (CARDIA)    Difficulty of Paying Living Expenses: Patient declined  Food Insecurity: No Food Insecurity (01/06/2024)   Hunger Vital Sign    Worried About Running Out of Food in the Last Year: Never true    Ran Out of Food in the Last Year: Never true  Transportation Needs: No Transportation Needs (01/22/2024)   Received from [facility] - Transportation    In the past 12 months, has lack of transportation kept you from medical appointments or from getting medications?: No    Lack of Transportation (Non-Medical): No  Physical Activity: Not on file  Stress: Not on file  Social Connections: Moderately Integrated (01/06/2024)   Social Connection and Isolation Panel [NHANES]    Frequency of Communication with Friends and  Family: More than three times a week    Frequency of Social Gatherings with Friends and Family: Once a week    Attends Religious Services: More than 4 times per year    Active Member of [employer] or Organizations: No    Attends Banker Meetings: Never    Marital Status: Married  Catering manager Violence: Not At Risk (01/06/2024)   Humiliation, Afraid, Rape, and Kick questionnaire    Fear of Current or Ex-Partner: No    Emotionally Abused: No    Physically Abused: No    Sexually Abused: No    Family History  Family history unknown: Yes     Current Outpatient Medications:    senna-docusate (SENOKOT-S) 8.6-50 MG tablet, Take 2 tablets by mouth., Disp: , Rfl:    acetaminophen  (TYLENOL ) 500 MG tablet, Take 1,000 mg by mouth every 6 (six) hours as needed for moderate pain., Disp: , Rfl:    albuterol   (VENTOLIN  HFA) 108 (90 Base) MCG/ACT inhaler, Inhale into the lungs., Disp: , Rfl:    aspirin EC 81 MG tablet, Take 81 mg by mouth every morning. Swallow whole., Disp: , Rfl:    atenolol  (TENORMIN ) 50 MG tablet, Take 50 mg by mouth every morning., Disp: , Rfl:    diazepam (VALIUM) 5 MG tablet, , Disp: , Rfl:    omeprazole (PRILOSEC) 20 MG capsule, Take 20 mg by mouth every morning., Disp: , Rfl:    oxyCODONE -acetaminophen  (PERCOCET) 5-325 MG tablet, Take 1 tablet by mouth every 6 (six) hours as needed for severe pain (pain score 7-10)., Disp: 12 tablet, Rfl: 0   rosuvastatin  (CRESTOR ) 10 MG tablet, Take 1 tablet by mouth every morning., Disp: , Rfl:    tamsulosin  (FLOMAX ) 0.4 MG CAPS capsule, Take 1 capsule (0.4 mg total) by mouth daily after breakfast., Disp: 30 capsule, Rfl: 0  Physical exam:  Vitals:   03/16/24 1130  BP: 137/65  Pulse: 62  Resp: 18  Temp: 98 F (36.7 C)  TempSrc: Tympanic  SpO2: 100%  Weight: 179 lb 11.2 oz (81.5 kg)  Height: 5\' 7"  (1.702 m)   Physical Exam Cardiovascular:     Rate and Rhythm: Normal rate and regular rhythm.     Heart sounds: Normal heart sounds.  Pulmonary:     Effort: Pulmonary effort is normal.     Breath sounds: Normal breath sounds.  Abdominal:     General: Bowel sounds are normal.     Palpations: Abdomen is soft.  Skin:    General: Skin is warm and dry.  Neurological:     Mental Status: He is alert and oriented to person, place, and time.      I have personally reviewed labs listed below:    Latest Ref Rng & Units 01/06/2024    5:09 AM  CMP  Glucose 70 - 99 mg/dL 161   BUN 8 - 23 mg/dL 23   Creatinine 0.96 - 1.24 mg/dL 0.45   Sodium 409 - 811 mmol/L 138   Potassium 3.5 - 5.1 mmol/L 4.3   Chloride 98 - 111 mmol/L 110   CO2 22 - 32 mmol/L 19   Calcium  8.9 - 10.3 mg/dL 9.2       Latest Ref Rng & Units 01/05/2024    7:47 AM  CBC  WBC 4.0 - 10.5 K/uL 4.4   Hemoglobin 13.0 - 17.0 g/dL 91.4   Hematocrit 78.2 - 52.0 % 40.1    Platelets 150 - 400 K/uL 73  I have personally reviewed Radiology images listed below: No images are attached to the encounter.  MR LIVER W WO CONTRAST Result Date: 03/02/2024 CLINICAL DATA:  Hepatocellular carcinoma. Status post microwave ablation of 2 right hepatic lobe lesions 01/05/2024 EXAM: MRI ABDOMEN WITHOUT AND WITH CONTRAST TECHNIQUE: Multiplanar multisequence MR imaging of the abdomen was performed both before and after the administration of intravenous contrast. CONTRAST:  7.5mL GADAVIST  GADOBUTROL  1 MMOL/ML IV SOLN COMPARISON:  MRI abdomen 10/20/2023 FINDINGS: Lower chest: The lung bases are grossly clear. No pulmonary lesions or pleural effusions. No pericardial effusion. Hepatobiliary: Stable cirrhotic changes involving the liver with associated portal venous hypertension, portal venous collaterals and splenomegaly. The 2 right hepatic lobe lesions in segment 6 of the liver demonstrate expected post ablation changes. The smaller more anterior lesion measures 2.8 x 2.2 cm and demonstrates areas of internal increased T1 signal intensity consistent with blood products. No enhancement to suggest residual tumor. The larger lesion posteriorly in segment 6 also demonstrates some internal blood products but no worrisome enhancement to suggest residual tumor. No new early arterial phase enhancing lesions are identified. The gallbladder is unremarkable. Normal caliber common bile duct. Pancreas: No mass, inflammatory changes, or other parenchymal abnormality identified. Spleen:  Stable splenomegaly.  No splenic lesions. Adrenals/Urinary Tract: Adrenal glands and kidneys are unremarkable and stable. Small scattered simple renal cysts not requiring any further imaging evaluation or follow-up. Stable small hemorrhagic cyst involving the lower pole region of the right kidney (Bosniak 2), no further imaging evaluation or follow-up is necessary. Stomach/Bowel: Visualized portions within the abdomen are  unremarkable. Vascular/Lymphatic: The aorta and branch vessels are patent. No aneurysm or dissection. No abdominal lymphadenopathy. Other:  Moderate abdominal ascites. Musculoskeletal: No significant bony findings. IMPRESSION: 1. Expected post ablation changes involving the 2 right hepatic lobe HCC's in segment 6 of the liver. No findings for residual tumor. LI-RADS category TR nonviable. 2. No new early arterial phase enhancing lesions are identified. 3. Stable cirrhotic changes involving the liver with associated portal venous hypertension, portal venous collaterals and splenomegaly. 4. Moderate abdominal ascites. Electronically Signed   By: Marrian Siva M.D.   On: 03/02/2024 10:49     Assessment and plan- Patient is a 78 y.o. male with history of stage II hepatocellular carcinoma T2 N0 M0 s/pMicrowave ablation here for routine follow-up  MRI liver with and without contrast 2 months after his microwave ablation shows post ablation changes in the right hepatic lobe lesions with no concerns for residual tumor.  LI-RADS nonviable.  No other new concerning findings identified.  I will plan to get a repeat MRI liver in 3 months time and see him thereafter with CBC with differential CMP and AFP.  Following that I will plan to get MRI every 6 months   Visit Diagnosis 1. Encounter for follow-up surveillance of liver cancer      Dr. Seretha Dance, MD, MPH [facility] [phone] 03/16/2024 12:54 PM

## 2024-06-15 ENCOUNTER — Ambulatory Visit
Admission: RE | Admit: 2024-06-15 | Discharge: 2024-06-15 | Disposition: A | Discharge status: Home / Self Care | Source: Ambulatory Visit | Attending: Oncology | Admitting: Oncology

## 2024-06-15 DIAGNOSIS — C220 Liver cell carcinoma: Secondary | ICD-10-CM | POA: Diagnosis present

## 2024-06-15 MED ORDER — GADOBUTROL 1 MMOL/ML IV SOLN
7.5000 mL | Freq: Once | INTRAVENOUS | Status: AC | PRN
  Administered 2024-06-15: 7.5 mL via INTRAVENOUS

## 2024-07-01 ENCOUNTER — Inpatient Hospital Stay: Admitting: Oncology

## 2024-07-01 ENCOUNTER — Inpatient Hospital Stay: Attending: Oncology

## 2024-07-01 ENCOUNTER — Encounter: Payer: Self-pay | Admitting: Oncology

## 2024-07-01 ENCOUNTER — Telehealth: Payer: Self-pay

## 2024-07-01 VITALS — BP 183/86 | HR 66 | Temp 99.0°F | Resp 18 | Ht 67.0 in | Wt 174.1 lb

## 2024-07-01 DIAGNOSIS — K766 Portal hypertension: Secondary | ICD-10-CM | POA: Diagnosis not present

## 2024-07-01 DIAGNOSIS — C220 Liver cell carcinoma: Secondary | ICD-10-CM | POA: Diagnosis not present

## 2024-07-01 DIAGNOSIS — Z87891 Personal history of nicotine dependence: Secondary | ICD-10-CM | POA: Diagnosis not present

## 2024-07-01 DIAGNOSIS — Z8505 Personal history of malignant neoplasm of liver: Secondary | ICD-10-CM

## 2024-07-01 DIAGNOSIS — K7460 Unspecified cirrhosis of liver: Secondary | ICD-10-CM | POA: Diagnosis not present

## 2024-07-01 DIAGNOSIS — E785 Hyperlipidemia, unspecified: Secondary | ICD-10-CM | POA: Insufficient documentation

## 2024-07-01 LAB — CMP (CANCER CENTER ONLY)
ALT: 31 U/L (ref 0–44)
AST: 48 U/L — ABNORMAL HIGH (ref 15–41)
Albumin: 3.4 g/dL — ABNORMAL LOW (ref 3.5–5.0)
Alkaline Phosphatase: 94 U/L (ref 38–126)
Anion gap: 8 (ref 5–15)
BUN: 14 mg/dL (ref 8–23)
CO2: 22 mmol/L (ref 22–32)
Calcium: 10 mg/dL (ref 8.9–10.3)
Chloride: 108 mmol/L (ref 98–111)
Creatinine: 0.97 mg/dL (ref 0.61–1.24)
GFR, Estimated: >60 mL/min (ref >60)
Glucose, Bld: 94 mg/dL (ref 70–99)
Potassium: 4.5 mmol/L (ref 3.5–5.1)
Sodium: 138 mmol/L (ref 135–145)
Total Bilirubin: 2.3 mg/dL — ABNORMAL HIGH (ref 0.0–1.2)
Total Protein: 6.9 g/dL (ref 6.5–8.1)

## 2024-07-01 LAB — CBC WITH DIFFERENTIAL (CANCER CENTER ONLY)
Abs Immature Granulocytes: 0.01 K/uL (ref 0.00–0.07)
Basophils Absolute: 0 K/uL (ref 0.0–0.1)
Basophils Relative: 1 %
Eosinophils Absolute: 0.1 K/uL (ref 0.0–0.5)
Eosinophils Relative: 3 %
HCT: 40.2 % (ref 39.0–52.0)
Hemoglobin: 14.2 g/dL (ref 13.0–17.0)
Immature Granulocytes: 0 %
Lymphocytes Relative: 21 %
Lymphs Abs: 1 K/uL (ref 0.7–4.0)
MCH: 33.1 pg (ref 26.0–34.0)
MCHC: 35.3 g/dL (ref 30.0–36.0)
MCV: 93.7 fL (ref 80.0–100.0)
Monocytes Absolute: 0.5 K/uL (ref 0.1–1.0)
Monocytes Relative: 11 %
Neutro Abs: 3 K/uL (ref 1.7–7.7)
Neutrophils Relative %: 64 %
Platelet Count: 70 K/uL — ABNORMAL LOW (ref 150–400)
RBC: 4.29 MIL/uL (ref 4.22–5.81)
RDW: 13.8 % (ref 11.5–15.5)
WBC Count: 4.8 K/uL (ref 4.0–10.5)
nRBC: 0 % (ref 0.0–0.2)

## 2024-07-01 NOTE — Progress Notes (Deleted)
 Cardiology Office Note  Date:  07/01/2024   ID:  Andras, Grunewald [birth year], MRN [id number]  PCP:  Valora Agent, MD   No chief complaint on file.   HPI:  Luke Wall a 78 y.o. malewith past medical history of: Coronary artery disease, PTCA 1990s Essential hypertension Hyperlipidemia Hepatocellular carcinoma/liver cirrhosis Sinus bradycardia History of kidney stones, urethral stents    2D echocardiogram 03/11/2024 revealed normal left ventricular function, with LVEF greater than 55%, with mild tricuspid regurgitation and mitral regurgitation.   ETT Myoview 09/29/2022, exercised 7 minutes on the Bruce protocol without chest pain or ischemic ECG changes. Gated scintigraphy revealed LV ejection fraction of 63%. SPECT analysis did not reveal evidence for scar or ischemia.    PMH:   has a past medical history of Angina pectoris (HCC), Aortic atherosclerosis (HCC), Ascending aorta dilation (HCC), Cervicalgia, Cholelithiasis, Cirrhosis (HCC), Coronary artery disease, Diverticulosis, Full dentures, GERD (gastroesophageal reflux disease), Heart murmur, Hepatocellular carcinoma (HCC) (2024), History of alcohol abuse, History of bilateral cataract extraction (2019), History of blood transfusion (1960), History of kidney stones, Hypertension, Myocardial infarction [facility]), Osteoarthritis of left knee (09/16/2020), Portal venous hypertension (HCC), Rheumatoid arthritis (HCC), Right inguinal hernia, Splenomegaly, and Thrombocytopenia (HCC).  PSH:    Past Surgical History:  Procedure Laterality Date   CARDIAC CATHETERIZATION  1990's   POBA   CATARACT EXTRACTION W/ INTRAOCULAR LENS  IMPLANT, BILATERAL Bilateral 2019   COLONOSCOPY  2018   CYSTOSCOPY W/ URETERAL STENT REMOVAL Bilateral 08/25/2023   Procedure: CYSTOSCOPY WITH STENT REMOVAL;  Surgeon: Twylla Glendia BROCKS, MD;  Location: [facility] OR[facility];  Service: Urology;  Laterality: Bilateral;   CYSTOSCOPY/URETEROSCOPY/HOLMIUM LASER/STENT PLACEMENT  Bilateral 08/04/2023   Procedure: CYSTOSCOPY/URETEROSCOPY/HOLMIUM LASER/STENT PLACEMENT;  Surgeon: Twylla Glendia BROCKS, MD;  Location: [facility];  Service: Urology;  Laterality: Bilateral;   ESOPHAGOGASTRODUODENOSCOPY (EGD) WITH PROPOFOL  N/A 12/30/2023   Procedure: ESOPHAGOGASTRODUODENOSCOPY (EGD) WITH PROPOFOL ;  Surgeon: Toledo, Ladell POUR, MD;  Location: [facility];  Service: Gastroenterology;  Laterality: N/A;   IR RADIOLOGIST EVAL & MGMT  11/03/2023   IR RADIOLOGIST EVAL & MGMT  02/03/2024   KNEE ARTHROPLASTY Left 02/08/2021   Procedure: COMPUTER ASSISTED TOTAL KNEE ARTHROPLASTY - RNFA;  Surgeon: Mardee Agent SQUIBB, MD;  Location: [facility] OR[facility];  Service: Orthopedics;  Laterality: Left;   KNEE ARTHROPLASTY Right 06/17/2021   Procedure: COMPUTER ASSISTED TOTAL KNEE ARTHROPLASTY;  Surgeon: Mardee Agent SQUIBB, MD;  Location: [facility] OR[facility];  Service: Orthopedics;  Laterality: Right;   KNEE ARTHROSCOPY Left 1976   TONSILLECTOMY  1960    Current Outpatient Medications  Medication Sig Dispense Refill   acetaminophen  (TYLENOL ) 500 MG tablet Take 1,000 mg by mouth every 6 (six) hours as needed for moderate pain.     albuterol  (VENTOLIN  HFA) 108 (90 Base) MCG/ACT inhaler Inhale into the lungs.     aspirin EC 81 MG tablet Take 81 mg by mouth every morning. Swallow whole.     losartan (COZAAR) 25 MG tablet Take 25 mg by mouth daily.     omeprazole (PRILOSEC) 20 MG capsule Take 20 mg by mouth every morning.     rosuvastatin  (CRESTOR ) 10 MG tablet Take 1 tablet by mouth every morning.     senna-docusate (SENOKOT-S) 8.6-50 MG tablet Take 2 tablets by mouth.     tamsulosin  (FLOMAX ) 0.4 MG CAPS capsule Take 1 capsule (0.4 mg total) by mouth daily after breakfast. 30 capsule 0   No current facility-administered medications for this visit.     Allergies:   Tramadol   Social History:  The patient  reports that he has quit smoking. His smoking use included cigarettes. He started smoking about 25 years ago. He has a 5.8  pack-year smoking history. He has been exposed to tobacco smoke. He has never used smokeless tobacco. He reports that he does not currently use alcohol. He reports that he does not use drugs.   Family History:   Family history is unknown by patient.    Review of Systems: ROS   PHYSICAL EXAM: VS:  There were no vitals taken for this visit. , BMI There is no height or weight on file to calculate BMI. GEN: Well nourished, well developed, in no acute distress HEENT: normal Neck: no JVD, carotid bruits, or masses Cardiac: RRR; no murmurs, rubs, or gallops,no edema  Respiratory:  clear to auscultation bilaterally, normal work of breathing GI: soft, nontender, nondistended, + BS MS: no deformity or atrophy Skin: warm and dry, no rash Neuro:  Strength and sensation are intact Psych: euthymic mood, full affect    Recent Labs: 07/01/2024: ALT 31; BUN 14; Creatinine 0.97; Hemoglobin 14.2; Platelet Count 70; Potassium 4.5; Sodium 138    Lipid Panel No results found for: CHOL, HDL, LDLCALC, TRIG    Wt Readings from Last 3 Encounters:  07/01/24 174 lb 1.6 oz (79 kg)  03/16/24 179 lb 11.2 oz (81.5 kg)  01/05/24 183 lb 4.8 oz (83.1 kg)       ASSESSMENT AND PLAN:  Problem List Items Addressed This Visit   None    Disposition:   F/U  12 months   Total encounter time more than 30 minutes  Greater than 50% was spent in counseling and coordination of care with the patient    Signed, Velinda Lunger, M.D., Ph.D. [facility] [place], [place] [phone]

## 2024-07-01 NOTE — Progress Notes (Signed)
 Pt states the new losartan is making him weak, tired and nauseous. Using some nausea med given by Dr. Hooten, almost out. Would like rx, he had the tablets that dissolve under tongue.  Having to get up 4-5 times a night to urinate. Still hurting from his hernia repair off and on.  Had MRI liver 06/15/24.  He is asking for you to review with him

## 2024-07-01 NOTE — Telephone Encounter (Signed)
 New patient GI ref sent requested per Dr. Melanee LAMY GI fax [phone]

## 2024-07-01 NOTE — Progress Notes (Signed)
 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 Tye for unilateral right inguinal hernia. He underwent CT abdomen which showed possible cirrhosis as well as indeterminate 2.2 cm lesion in the right hepatic lobe along with bilateral kidney stones with mild right hydronephrosis and proximal hydroureter and mild splenomegaly. This was followed by MRI liver with and without contrast which showed cirrhotic changes in the liver with portal venous hypertension, portal venous collaterals splenomegaly and ascites. He was noted to have 2 lesions 1 involving the anterior aspect of segment 6 measuring 2 cm characterized as LI-RADS 5. There was another 2.3 x 2.2 cm lesion in segment 6 which was also characterized as LI-RADS 5.    Given his cirrhosis portal hypertension thrombocytopenia and ascites he is unlikely to be a candidate for definitive resection.  He is following up with Dr. Myrna from [facility] transplant.  He was also evaluated by [facility] GI to workup his cirrhosis.  Patient underwent microwave ablation toHis liver lesions in February 2025.  Patient underwent inguinal hernia repair at [facility] as well  Interval history-patient reports on and off right groin pain which gets better with bowel movements.  States that he has been having more side effects after he was switched to losartan for his blood pressure.  ECOG PS- 1 Pain scale- 3   Review of systems- Review of Systems  Constitutional:  Positive  for malaise/fatigue. Negative for chills, fever and weight loss.  HENT:  Negative for congestion, ear discharge and nosebleeds.   Eyes:  Negative for blurred vision.  Respiratory:  Negative for cough, hemoptysis, sputum production, shortness of breath and wheezing.   Cardiovascular:  Negative for chest pain, palpitations, orthopnea and claudication.  Gastrointestinal:  Negative for abdominal pain, blood in stool, constipation, diarrhea, heartburn, melena, nausea and vomiting.  Genitourinary:  Negative for dysuria, flank pain, frequency, hematuria and urgency.  Musculoskeletal:  Negative for back pain, joint pain and myalgias.  Skin:  Negative for rash.  Neurological:  Negative for dizziness, tingling, focal weakness, seizures, weakness and headaches.  Endo/Heme/Allergies:  Does not bruise/bleed easily.  Psychiatric/Behavioral:  Negative for depression and suicidal ideas. The patient does not have insomnia.       Allergies  Allergen Reactions   Tramadol  Other (See Comments)    Gi distress     Past Medical History:  Diagnosis Date   Angina pectoris (HCC)    Aortic atherosclerosis (HCC)    Ascending aorta dilation (HCC)    a.) CT chest 11/04/2023: 4.1 cm   Cervicalgia    Cholelithiasis    Cirrhosis (HCC)    Coronary artery disease    a.) s/p PTCA/POBA in the 1990s at [facility] - details unclear; c.) CT chest 11/04/2023: 2v CAD   Diverticulosis    Full dentures    GERD (gastroesophageal reflux disease)    Heart murmur    Hepatocellular carcinoma (HCC) 2024   a.) stage II (T2 N0 M0)   History of alcohol abuse    History of  bilateral cataract extraction 2019   History of blood transfusion 1960   a.) age 83; postoperatively following tonsillectomy   History of kidney stones    Hypertension    Myocardial infarction [facility])    Osteoarthritis of left knee 09/16/2020   Portal venous hypertension (HCC)    Rheumatoid arthritis (HCC)    Right inguinal hernia     Splenomegaly    Thrombocytopenia (HCC)      Past Surgical History:  Procedure Laterality Date   CARDIAC CATHETERIZATION  1990's   POBA   CATARACT EXTRACTION W/ INTRAOCULAR LENS  IMPLANT, BILATERAL Bilateral 2019   COLONOSCOPY  2018   CYSTOSCOPY W/ URETERAL STENT REMOVAL Bilateral 08/25/2023   Procedure: CYSTOSCOPY WITH STENT REMOVAL;  Surgeon: Twylla Glendia BROCKS, MD;  Location: [facility] ORS;  Service: Urology;  Laterality: Bilateral;   CYSTOSCOPY/URETEROSCOPY/HOLMIUM LASER/STENT PLACEMENT Bilateral 08/04/2023   Procedure: CYSTOSCOPY/URETEROSCOPY/HOLMIUM LASER/STENT PLACEMENT;  Surgeon: Twylla Glendia BROCKS, MD;  Location: [facility] ORS;  Service: Urology;  Laterality: Bilateral;   ESOPHAGOGASTRODUODENOSCOPY (EGD) WITH PROPOFOL  N/A 12/30/2023   Procedure: ESOPHAGOGASTRODUODENOSCOPY (EGD) WITH PROPOFOL ;  Surgeon: Toledo, Ladell POUR, MD;  Location: [facility] ENDOSCOPY;  Service: Gastroenterology;  Laterality: N/A;   IR RADIOLOGIST EVAL & MGMT  11/03/2023   IR RADIOLOGIST EVAL & MGMT  02/03/2024   KNEE ARTHROPLASTY Left 02/08/2021   Procedure: COMPUTER ASSISTED TOTAL KNEE ARTHROPLASTY - RNFA;  Surgeon: Mardee Lynwood SQUIBB, MD;  Location: [facility] ORS;  Service: Orthopedics;  Laterality: Left;   KNEE ARTHROPLASTY Right 06/17/2021   Procedure: COMPUTER ASSISTED TOTAL KNEE ARTHROPLASTY;  Surgeon: Mardee Lynwood SQUIBB, MD;  Location: [facility] ORS;  Service: Orthopedics;  Laterality: Right;   KNEE ARTHROSCOPY Left 1976   TONSILLECTOMY  1960    Social History   Socioeconomic History   Marital status: Married    Spouse name: Rock   Number of children: 1   Years of education: Not on file   Highest education level: Not on file  Occupational History   Not on file  Tobacco Use   Smoking status: Former    Current packs/day: 0.50    Average packs/day: 0.5 packs/day for 11.6 years (5.8 ttl pk-yrs)    Types: Cigarettes    Start date: 2000    Passive exposure: Past   Smokeless tobacco: Never  Vaping Use   Vaping status: Never  Used  Substance and Sexual Activity   Alcohol use: Not Currently   Drug use: Never   Sexual activity: Not on file  Other Topics Concern   Not on file  Social History Narrative   Lives with wife at home. Pets(dog) at home   Social Drivers of Health   Financial Resource Strain: Medium Risk (05/06/2024)   Received from [facility]   Overall Financial Resource Strain (CARDIA)    Difficulty of Paying Living Expenses: Somewhat hard  Food Insecurity: Food Insecurity Present (05/06/2024)   Received from [facility]   Hunger Vital Sign    Within the past 12 months, you worried that your food would run out before you got the money to buy more.: Sometimes true    Within the past 12 months, the food you bought just didn't last and you didn't have money to get more.: Sometimes true  Transportation Needs: No Transportation Needs (05/06/2024)   Received from [facility] - Transportation    In the past 12 months, has lack of transportation kept you from medical appointments or from getting medications?: No  Lack of Transportation (Non-Medical): No  Physical Activity: Not on file  Stress: Not on file  Social Connections: Moderately Integrated (01/06/2024)   Social Connection and Isolation Panel    Frequency of Communication with Friends and Family: More than three times a week    Frequency of Social Gatherings with Friends and Family: Once a week    Attends Religious Services: More than 4 times per year    Active Member of [employer] or Organizations: No    Attends Banker Meetings: Never    Marital Status: Married  Catering manager Violence: Not At Risk (01/06/2024)   Humiliation, Afraid, Rape, and Kick questionnaire    Fear of Current or Ex-Partner: No    Emotionally Abused: No    Physically Abused: No    Sexually Abused: No    Family History  Family history unknown: Yes     Current Outpatient Medications:     acetaminophen  (TYLENOL ) 500 MG tablet, Take 1,000 mg by mouth every 6 (six) hours as needed for moderate pain., Disp: , Rfl:    albuterol  (VENTOLIN  HFA) 108 (90 Base) MCG/ACT inhaler, Inhale into the lungs., Disp: , Rfl:    aspirin EC 81 MG tablet, Take 81 mg by mouth every morning. Swallow whole., Disp: , Rfl:    losartan (COZAAR) 25 MG tablet, Take 25 mg by mouth daily., Disp: , Rfl:    omeprazole (PRILOSEC) 20 MG capsule, Take 20 mg by mouth every morning., Disp: , Rfl:    rosuvastatin  (CRESTOR ) 10 MG tablet, Take 1 tablet by mouth every morning., Disp: , Rfl:    senna-docusate (SENOKOT-S) 8.6-50 MG tablet, Take 2 tablets by mouth., Disp: , Rfl:    tamsulosin  (FLOMAX ) 0.4 MG CAPS capsule, Take 1 capsule (0.4 mg total) by mouth daily after breakfast., Disp: 30 capsule, Rfl: 0  Physical exam:  Vitals:   07/01/24 1415 07/01/24 1442  BP: (!) 187/80 (!) 183/86  Pulse: 66   Resp: 18   Temp: 99 F (37.2 C)   TempSrc: Tympanic   SpO2: 100%   Weight: 174 lb 1.6 oz (79 kg)   Height: 5' 7 (1.702 m)    Physical Exam Cardiovascular:     Rate and Rhythm: Normal rate and regular rhythm.     Heart sounds: Normal heart sounds.  Pulmonary:     Effort: Pulmonary effort is normal.     Breath sounds: Normal breath sounds.  Abdominal:     General: Bowel sounds are normal.     Palpations: Abdomen is soft.  Skin:    General: Skin is warm and dry.  Neurological:     Mental Status: He is alert and oriented to person, place, and time.      I have personally reviewed labs listed below:    Latest Ref Rng & Units 07/01/2024    2:16 PM  CMP  Glucose 70 - 99 mg/dL 94   BUN 8 - 23 mg/dL 14   Creatinine 9.38 - 1.24 mg/dL 9.02   Sodium 864 - 854 mmol/L 138   Potassium 3.5 - 5.1 mmol/L 4.5   Chloride 98 - 111 mmol/L 108   CO2 22 - 32 mmol/L 22   Calcium  8.9 - 10.3 mg/dL 89.9   Total Protein 6.5 - 8.1 g/dL 6.9   Total Bilirubin 0.0 - 1.2 mg/dL 2.3   Alkaline Phos 38 - 126 U/L 94   AST 15 - 41 U/L  48   ALT 0 - 44 U/L 31  Latest Ref Rng & Units 07/01/2024    2:16 PM  CBC  WBC 4.0 - 10.5 K/uL 4.8   Hemoglobin 13.0 - 17.0 g/dL 85.7   Hematocrit 60.9 - 52.0 % 40.2   Platelets 150 - 400 K/uL 70    I have personally reviewed Radiology images listed below: No images are attached to the encounter.  MR LIVER W WO CONTRAST Result Date: 06/15/2024 CLINICAL DATA:  Hepatocellular carcinoma, status post microwave ablation of 2 sites in the right lobe of the liver EXAM: MRI ABDOMEN WITHOUT AND WITH CONTRAST TECHNIQUE: Multiplanar multisequence MR imaging of the abdomen was performed both before and after the administration of intravenous contrast. CONTRAST:  7.5mL GADAVIST  GADOBUTROL  1 MMOL/ML IV SOLN COMPARISON:  03/01/2024 FINDINGS: Lower chest: No acute abnormality. Hepatobiliary: Coarse, nodular cirrhotic morphology of the liver. Unchanged, nonenhancing ablation sites in hepatic segment VII with targetoid, slightly T1 hyperintense hemorrhagic or proteinaceous signal (series 14, image 23). No gallstones, gallbladder wall thickening, or biliary dilatation. Pancreas: Unremarkable. No pancreatic ductal dilatation or surrounding inflammatory changes. Spleen: Splenomegaly, maximum span 14.7 cm. Adrenals/Urinary Tract: Adrenal glands are unremarkable. Tiny fluid signal parapelvic and renal cortical cysts, benign, requiring no further follow-up or characterization. Kidneys are otherwise normal, without obvious renal calculi, solid lesion, or hydronephrosis. Stomach/Bowel: Stomach is within normal limits. No evidence of bowel wall thickening, distention, or inflammatory changes. Vascular/Lymphatic: Aortic atherosclerosis. No enlarged abdominal lymph nodes. Other: No abdominal wall hernia or abnormality. Small volume perihepatic and perisplenic ascites. Musculoskeletal: No acute or significant osseous findings. IMPRESSION: 1. Unchanged, nonenhancing ablation sites in hepatic segment VII. No evidence of  recurrent or metastatic disease in the abdomen. LI-RADS TR, nonviable. 2. No new liver lesions. 3. Cirrhosis, splenomegaly, and small volume ascites. Aortic Atherosclerosis (ICD10-I70.0). Electronically Signed   By: Marolyn JONETTA Jaksch M.D.   On: 06/15/2024 21:36     Assessment and plan- Patient is a 78 y.o. male with history of stage II hepatocellular carcinoma T2 N0 M0 s/p microwave ablation here to discuss MRI liver findings and further management  I have reviewed MRI images independently and discussed findings with the patient which does not show any evidence of recurrent or progressive disease.  He underwent microwave ablation for the same in February 2025.  I will repeat MRI liver again in 6 months and see him back with CBC with differential CMP and AFP.  aFP from today is pending.  Patient has baseline cirrhosis and portal hypertension and does not see GI.  Dr. Aundria did his endoscopy and I am referring him back to [facility] GI for the same  With regards to side effects of losartan I would recommend that he should discuss this with cardiology   Visit Diagnosis 1. Hepatocellular carcinoma (HCC)   2. Encounter for follow-up surveillance of liver cancer      Dr. Annah Skene, MD, MPH [facility] [phone] 07/01/2024 3:35 PM

## 2024-07-02 LAB — AFP TUMOR MARKER: AFP, Serum, Tumor Marker: 22.8 ng/mL — ABNORMAL HIGH (ref 0.0–8.4)

## 2024-07-04 ENCOUNTER — Ambulatory Visit: Attending: Cardiovascular Disease | Admitting: Cardiovascular Disease

## 2024-07-04 DIAGNOSIS — E782 Mixed hyperlipidemia: Secondary | ICD-10-CM

## 2024-07-04 DIAGNOSIS — I10 Essential (primary) hypertension: Secondary | ICD-10-CM

## 2024-07-04 DIAGNOSIS — C220 Liver cell carcinoma: Secondary | ICD-10-CM

## 2024-07-04 DIAGNOSIS — I25119 Atherosclerotic heart disease of native coronary artery with unspecified angina pectoris: Secondary | ICD-10-CM

## 2024-07-21 ENCOUNTER — Ambulatory Visit: Admitting: Pulmonary Disease

## 2024-07-21 ENCOUNTER — Telehealth: Payer: Self-pay

## 2024-07-21 NOTE — Telephone Encounter (Signed)
 Outbound call to check on referral status sent to [facility] for cirrhosis management.  Representative indicated patient is scheduled to be seen 12/05/24 at 11:30AM.

## 2024-08-21 NOTE — Progress Notes (Unsigned)
 Cardiology Office Note  Date:  08/22/2024   ID:  Luke Wall 03/06/1946, MRN [id number]  PCP:  Valora Agent, MD   Chief Complaint  Patient presents with   New Patient (Initial Visit)    Establish care for CAD s/p PTCA in [facility] in 1990's. Patient was being followed at [facility]. Patient c/o dizziness/lightheaded and tiredness more than usual.     HPI:  Luke Wall a 78 y.o. 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 Former smoker Who presents to establish care for coronary artery disease  Discussed prior anginal symptoms in 1990, angioplasty at that time Since 1990, no chest pain Active , does yard work On heavy activity denies chest pain  Blood pressure elevated on today's visit, was rushing in Typically pressure 130s/50-60 at home Moderate improvement on recheck at the end of the visit  Rare dizzy spells  5-day Holter monitor 02/24/2024-02/29/2024 revealed sinus bradycardia, mean heart rate 52 bpm, heart rate range 40 to 73 bpm, with infrequent premature ventricular contractions, and 5 brief atrial runs the longest lasting 10 beats.   2D echocardiogram 03/11/2024 revealed normal left ventricular function, with LVEF greater than 55%, with mild tricuspid regurgitation and mitral regurgitation.   ETT Myoview 09/29/2022, exercised 7 minutes on the Bruce protocol without chest pain or ischemic ECG changes. Gated scintigraphy revealed LV ejection fraction of 63%. SPECT analysis did not reveal evidence for scar or ischemia.   EKG personally reviewed by myself on todays visit EKG Interpretation Date/Time:  Monday August 22 2024 14:47:05 EDT Ventricular Rate:  73 PR Interval:  170 QRS Duration:  86 QT Interval:  414 QTC Calculation: 456 R Axis:   -39  Text Interpretation: Normal sinus rhythm Left  axis deviation Nonspecific ST abnormality When compared with ECG of 24-Dec-2023 13:16, Nonspecific T wave abnormality, improved in Anterolateral leads Confirmed by Perla Lye [phone]) on 08/22/2024 6:12:47 PM    PMH:   has a past medical history of Angina pectoris, Aortic atherosclerosis, Ascending aorta dilation, Cervicalgia, Cholelithiasis, Cirrhosis (HCC), Coronary artery disease, Diverticulosis, Full dentures, GERD (gastroesophageal reflux disease), Heart murmur, Hepatocellular carcinoma (HCC) (2024), History of alcohol abuse, History of bilateral cataract extraction (2019), History of blood transfusion (1960), History of kidney stones, Hypertension, Myocardial infarction [facility]), Osteoarthritis of left knee (09/16/2020), Portal venous hypertension (HCC), Rheumatoid arthritis (HCC), Right inguinal hernia, Splenomegaly, and Thrombocytopenia.  PSH:    Past Surgical History:  Procedure Laterality Date   CARDIAC CATHETERIZATION  1990's   POBA   CATARACT EXTRACTION W/ INTRAOCULAR LENS  IMPLANT, BILATERAL Bilateral 2019   COLONOSCOPY  2018   CYSTOSCOPY W/ URETERAL STENT REMOVAL Bilateral 08/25/2023   Procedure: CYSTOSCOPY WITH STENT REMOVAL;  Surgeon: Twylla Glendia BROCKS, MD;  Location: [facility] ORS;  Service: Urology;  Laterality: Bilateral;   CYSTOSCOPY/URETEROSCOPY/HOLMIUM LASER/STENT PLACEMENT Bilateral 08/04/2023   Procedure: CYSTOSCOPY/URETEROSCOPY/HOLMIUM LASER/STENT PLACEMENT;  Surgeon: Twylla Glendia BROCKS, MD;  Location: [facility] ORS;  Service: Urology;  Laterality: Bilateral;   ESOPHAGOGASTRODUODENOSCOPY (EGD) WITH PROPOFOL  N/A 12/30/2023   Procedure: ESOPHAGOGASTRODUODENOSCOPY (EGD) WITH PROPOFOL ;  Surgeon: Toledo, Ladell POUR, MD;  Location: [facility] ENDOSCOPY;  Service: Gastroenterology;  Laterality: N/A;   IR RADIOLOGIST EVAL & MGMT  11/03/2023   IR RADIOLOGIST EVAL & MGMT  02/03/2024   KNEE ARTHROPLASTY Left 02/08/2021   Procedure: COMPUTER ASSISTED TOTAL KNEE ARTHROPLASTY - RNFA;  Surgeon: Mardee Agent SQUIBB, MD;  Location: [facility]  ORS;  Service: Orthopedics;  Laterality: Left;   KNEE ARTHROPLASTY Right 06/17/2021   Procedure: COMPUTER ASSISTED TOTAL KNEE ARTHROPLASTY;  Surgeon: Mardee Lynwood SQUIBB, MD;  Location: [facility] ORS;  Service: Orthopedics;  Laterality: Right;   KNEE ARTHROSCOPY Left 1976   TONSILLECTOMY  1960    Current Outpatient Medications  Medication Sig Dispense Refill   acetaminophen  (TYLENOL ) 500 MG tablet Take 1,000 mg by mouth every 6 (six) hours as needed for moderate pain.     albuterol  (VENTOLIN  HFA) 108 (90 Base) MCG/ACT inhaler Inhale into the lungs.     aspirin EC 81 MG tablet Take 81 mg by mouth every morning. Swallow whole.     losartan (COZAAR) 25 MG tablet Take 25 mg by mouth daily.     omeprazole (PRILOSEC) 20 MG capsule Take 20 mg by mouth every morning.     potassium chloride  SA (KLOR-CON  M) 20 MEQ tablet Take 40 mEq by mouth daily.     rosuvastatin  (CRESTOR ) 10 MG tablet Take 1 tablet by mouth every morning.     senna-docusate (SENOKOT-S) 8.6-50 MG tablet Take 2 tablets by mouth.     tamsulosin  (FLOMAX ) 0.4 MG CAPS capsule Take 1 capsule (0.4 mg total) by mouth daily after breakfast. 30 capsule 0   No current facility-administered medications for this visit.    Allergies:   Tramadol    Social History:  The patient  reports that he has quit smoking. His smoking use included cigarettes. He started smoking about 25 years ago. He has a 5.9 pack-year smoking history. He has been exposed to tobacco smoke. He has never used smokeless tobacco. He reports that he does not currently use alcohol. He reports that he does not use drugs.   Family History:   Family history is unknown by patient.    Review of Systems: Review of Systems  Constitutional: Negative.   HENT: Negative.    Respiratory: Negative.    Cardiovascular: Negative.   Gastrointestinal: Negative.   Musculoskeletal: Negative.   Neurological: Negative.   Psychiatric/Behavioral: Negative.    All other  systems reviewed and are negative.  PHYSICAL EXAM: VS:  BP (!) 145/70   Pulse 73   Ht 5' 7 (1.702 m)   Wt 184 lb 2 oz (83.5 kg)   SpO2 99%   BMI 28.84 kg/m  , BMI Body mass index is 28.84 kg/m. GEN: Well nourished, well developed, in no acute distress HEENT: normal Neck: no JVD, carotid bruits, or masses Cardiac: RRR; no murmurs, rubs, or gallops,no edema  Respiratory:  clear to auscultation bilaterally, normal work of breathing GI: soft, nontender, nondistended, + BS MS: no deformity or atrophy Skin: warm and dry, no rash Neuro:  Strength and sensation are intact Psych: euthymic mood, full affect  Recent Labs: 07/01/2024: ALT 31; BUN 14; Creatinine 0.97; Hemoglobin 14.2; Platelet Count 70; Potassium 4.5; Sodium 138    Lipid Panel No results found for: CHOL, HDL, LDLCALC, TRIG    Wt Readings from Last 3 Encounters:  08/22/24 184 lb 2 oz (83.5 kg)  07/01/24 174 lb 1.6 oz (79 kg)  03/16/24 179 lb 11.2 oz (81.5 kg)     ASSESSMENT AND PLAN:  Problem List Items Addressed This Visit       Cardiovascular and Mediastinum   Coronary artery disease involving native coronary artery of native heart with angina pectoris - Primary   Relevant Orders   EKG 12-Lead (Completed)   Essential hypertension   Relevant Orders   EKG 12-Lead (Completed)  Digestive   Hepatocellular carcinoma (HCC)     Other   Hyperlipidemia    Coronary artery disease with stable angina Prior angioplasty 1990, no further intervention since that time -Non-smoker, no diabetes, on Crestor  10 daily Cholesterol well-controlled 114 No further testing at this time  Hyperlipidemia Cholesterol is at goal on the current lipid regimen. No changes to the medications were made.  history of hepatocellular carcinoma States he had spots ablated in the past Denies any recurrence  Essential hypertension Initially markedly elevated, moderate improvement on recheck Recent visit with other offices,  pressure well-controlled Recommend he closely monitor numbers at home  Signed, Velinda Lunger, M.D., Ph.D. [facility] [place], [place] [phone]

## 2024-08-22 ENCOUNTER — Encounter: Payer: Self-pay | Admitting: Cardiovascular Disease

## 2024-08-22 ENCOUNTER — Ambulatory Visit: Attending: Cardiovascular Disease | Admitting: Cardiovascular Disease

## 2024-08-22 VITALS — BP 145/70 | HR 73 | Ht 67.0 in | Wt 184.1 lb

## 2024-08-22 DIAGNOSIS — C220 Liver cell carcinoma: Secondary | ICD-10-CM | POA: Diagnosis not present

## 2024-08-22 DIAGNOSIS — I10 Essential (primary) hypertension: Secondary | ICD-10-CM

## 2024-08-22 DIAGNOSIS — E782 Mixed hyperlipidemia: Secondary | ICD-10-CM | POA: Diagnosis not present

## 2024-08-22 DIAGNOSIS — I25119 Atherosclerotic heart disease of native coronary artery with unspecified angina pectoris: Secondary | ICD-10-CM

## 2024-08-22 NOTE — Patient Instructions (Addendum)
 Medication Instructions:  Your physician recommends that you continue on your current medications as directed. Please refer to the Current Medication list given to you today.   *If you need a refill on your cardiac medications before your next appointment, please call your pharmacy*  Lab Work: No labs ordered today  If you have labs (blood work) drawn today and your tests are completely normal, you will receive your results only by: MyChart Message (if you have MyChart) OR A paper copy in the mail If you have any lab test that is abnormal or we need to change your treatment, we will call you to review the results.  Testing/Procedures: No test ordered today   Follow-Up: At [facility], you and your health needs are our priority.  As part of our continuing mission to provide you with exceptional heart care, our providers are all part of one team.  This team includes your primary Cardiologist (physician) and Advanced Practice Providers or APPs (Physician Assistants and Nurse Practitioners) who all work together to provide you with the care you need, when you need it.  Your next appointment:   6 month(s)  Provider:   You may see Dr. Timothy Gollan or one of the following Advanced Practice Providers on your designated Care Team:   Lonni Meager, NP Lesley Maffucci, PA-C Bernardino Bring, PA-C Cadence [place], PA-C Tylene Lunch, NP Barnie Hila, NP    We recommend signing up for the patient portal called MyChart.  Sign up information is provided on this After Visit Summary.  MyChart is used to connect with patients for Virtual Visits (Telemedicine).  Patients are able to view lab/test results, encounter notes, upcoming appointments, etc.  Non-urgent messages can be sent to your provider as well.   To learn more about what you can do with MyChart, go to [email or web address].

## 2024-12-05 ENCOUNTER — Other Ambulatory Visit: Payer: Self-pay | Admitting: Gastroenterology

## 2024-12-05 DIAGNOSIS — R772 Abnormality of alphafetoprotein: Secondary | ICD-10-CM

## 2024-12-05 DIAGNOSIS — C220 Liver cell carcinoma: Secondary | ICD-10-CM

## 2024-12-05 DIAGNOSIS — K7031 Alcoholic cirrhosis of liver with ascites: Secondary | ICD-10-CM

## 2024-12-05 DIAGNOSIS — K766 Portal hypertension: Secondary | ICD-10-CM

## 2024-12-07 ENCOUNTER — Other Ambulatory Visit: Payer: Self-pay | Admitting: Oncology

## 2024-12-07 ENCOUNTER — Ambulatory Visit
Admission: RE | Admit: 2024-12-07 | Discharge: 2024-12-07 | Disposition: A | Discharge status: Home / Self Care | Source: Ambulatory Visit | Attending: Gastroenterology | Admitting: Gastroenterology

## 2024-12-07 DIAGNOSIS — K7031 Alcoholic cirrhosis of liver with ascites: Secondary | ICD-10-CM | POA: Insufficient documentation

## 2024-12-07 DIAGNOSIS — I8500 Esophageal varices without bleeding: Secondary | ICD-10-CM | POA: Diagnosis present

## 2024-12-07 DIAGNOSIS — R772 Abnormality of alphafetoprotein: Secondary | ICD-10-CM | POA: Insufficient documentation

## 2024-12-07 DIAGNOSIS — C220 Liver cell carcinoma: Secondary | ICD-10-CM | POA: Diagnosis present

## 2024-12-07 DIAGNOSIS — K766 Portal hypertension: Secondary | ICD-10-CM | POA: Insufficient documentation

## 2024-12-07 MED ORDER — GADOBUTROL 1 MMOL/ML IV SOLN
8.0000 mL | Freq: Once | INTRAVENOUS | Status: AC | PRN
  Administered 2024-12-07: 8 mL via INTRAVENOUS

## 2024-12-12 ENCOUNTER — Encounter: Payer: Self-pay | Admitting: Internal Medicine

## 2024-12-15 ENCOUNTER — Inpatient Hospital Stay

## 2024-12-15 ENCOUNTER — Other Ambulatory Visit: Payer: Self-pay | Admitting: Interventional Radiology

## 2024-12-15 DIAGNOSIS — Z08 Encounter for follow-up examination after completed treatment for malignant neoplasm: Secondary | ICD-10-CM

## 2024-12-15 DIAGNOSIS — C220 Liver cell carcinoma: Secondary | ICD-10-CM

## 2024-12-20 ENCOUNTER — Ambulatory Visit: Admission: RE | Admit: 2024-12-20 | Admitting: Internal Medicine

## 2024-12-20 ENCOUNTER — Encounter: Admission: RE | Payer: Self-pay | Source: Home / Self Care

## 2024-12-20 ENCOUNTER — Ambulatory Visit

## 2024-12-21 NOTE — Progress Notes (Signed)
 Chief Complaint  Patient presents with   Respiratory Congestion    Chest/nasal, sore throat - x 4 days    Luke Wall is a 78 y.o. male in clinic today for an acute visit.  HPI: History of Present Illness Luke Wall is a 79 year old male who presents with worsening cough and chest pain. He is accompanied by his wife.  He has a persistent cough that began around Friday, characterized by mucus production and occasional hemoptysis. The cough is exacerbated by cold weather, causing discomfort in his lungs. He has been using Mucinex without relief and has not used an inhaler or other medications for the cough.  He has a history of liver cancer with previous radiation treatment for two lesions. He reports that a recent MRI showed two new lesions on the liver, each approximately two centimeters in size. He is not currently undergoing chemotherapy or radiation therapy.  He mentions a history of a sore throat, which led to the cancellation of a recent endoscopy appointment.   ROS: Review of Systems  Constitutional:  Negative for chills and fever.  HENT:  Positive for congestion.   Respiratory:  Positive for cough and shortness of breath.      Allergies  Allergen Reactions   Tramadol  Other (See Comments)    Gi distress   Etodolac Other (See Comments)    sweating   Tramadol -Acetaminophen  Nausea    Past Medical History:  Diagnosis Date   Asthma, unspecified asthma severity, unspecified whether complicated, unspecified whether persistent (HHS-HCC)    COPD (chronic obstructive pulmonary disease) (CMS/HHS-HCC)    Essential hypertension (has tried other meds that were not effective) on atenolol  11/23/2018   GERD (gastroesophageal reflux disease)    Hypertension    Nuclear sclerotic cataract of right eye 01/31/2016     Results for orders placed or performed in visit on 11/30/24  CBC w/auto Differential (5 Part)  Result Value Ref Range   WBC (White Blood Cell Count) 3.5 (L) 4.1 -  10.2 103/uL   RBC (Red Blood Cell Count) 4.33 (L) 4.69 - 6.13 106/uL   Hemoglobin 14.1 14.1 - 18.1 gm/dL   Hematocrit 59.8 59.9 - 52.0 %   MCV (Mean Corpuscular Volume) 92.6 80.0 - 100.0 fl   MCH (Mean Corpuscular Hemoglobin) 32.6 (H) 27.0 - 31.2 pg   MCHC (Mean Corpuscular Hemoglobin Concentration) 35.2 32.0 - 36.0 gm/dL   Platelet Count 67 (L) 150 - 450 103/uL   RDW-CV (Red Cell Distribution Width) 12.9 11.6 - 14.8 %   MPV (Mean Platelet Volume) 12.5 (H) 9.4 - 12.4 fl   Neutrophils 2.36 1.50 - 7.80 103/uL   Lymphocytes 0.73 (L) 1.00 - 3.60 103/uL   Monocytes 0.33 0.00 - 1.50 103/uL   Eosinophils 0.06 0.00 - 0.55 103/uL   Basophils 0.02 0.00 - 0.09 103/uL   Neutrophil % 67.2 32.0 - 70.0 %   Lymphocyte % 20.8 10.0 - 50.0 %   Monocyte % 9.4 4.0 - 13.0 %   Eosinophil % 1.7 1.0 - 5.0 %   Basophil% 0.6 0.0 - 2.0 %   Immature Granulocyte % 0.3 <=0.7 %   Immature Granulocyte Count 0.01 <=0.06 10^3/L  Comprehensive Metabolic Panel (CMP)  Result Value Ref Range   Glucose 148 (H) 70 - 110 mg/dL   Sodium 862 863 - 854 mmol/L   Potassium 4.1 3.6 - 5.1 mmol/L   Chloride 111 (H) 97 - 109 mmol/L   Carbon Dioxide (CO2) 20.4 (L) 22.0 - 32.0 mmol/L  Urea Nitrogen (BUN) 13 7 - 25 mg/dL   Creatinine 1.0 0.7 - 1.3 mg/dL   Glomerular Filtration Rate (eGFR) 77 >60 mL/min/1.73sq m   Calcium  9.2 8.7 - 10.3 mg/dL   AST  36 8 - 39 U/L   ALT  20 6 - 57 U/L   Alk Phos (alkaline Phosphatase) 93 34 - 104 U/L   Albumin 3.4 (L) 3.5 - 4.8 g/dL   Bilirubin, Total 1.8 (H) 0.3 - 1.2 mg/dL   Protein, Total 6.4 6.1 - 7.9 g/dL   A/G Ratio 1.1 1.0 - 5.0 gm/dL  Prothrombin Time (INR)  Result Value Ref Range   Prothrombin Time 15.1 (H) 10.1 - 13.4 Sec   Prothrombin INR 1.3 (L) 2.0 - 3.0   Narrative   Patients on stable oral anticoagulant therapy, the target therapeutic range for INR is 2.0-3.0 in most cases.  Patients with prosthetic heart valves, the range is 2.5-3.5  AFP, Serum, Tumor Marker -  [facility]  Result Value Ref Range   AFP, Serum, Tumor Marker - [facility] 46.8 (H) 0.0 - 8.4 ng/mL   Narrative   Performed at:  [address], [place], [place]  [id number] Lab Director: Frankey Sas MD, Phone:  [phone]    BP 120/68 (BP Location: Left upper arm, Patient Position: Sitting, BP Cuff Size: Adult)   Pulse (!) 47   Temp 37.3 C (99.2 F) (Temporal)   Ht 170.2 cm (5' 7.01)   Wt 84.6 kg (186 lb 9.6 oz)   SpO2 98%   BMI 29.22 kg/m    Physical Exam Constitutional:      Appearance: Normal appearance.  Cardiovascular:     Rate and Rhythm: Normal rate and regular rhythm.     Heart sounds: Normal heart sounds.  Pulmonary:     Effort: Pulmonary effort is normal.     Breath sounds: Normal breath sounds.  Neurological:     Mental Status: He is alert.     Plan: Assessment & Plan Acute Sinusitis Acute cough Persistent cough since Friday, worsening, with associated chest congestion. No inhaler use. Patient has follow up appointments with oncology and is concerned how his illness will affect what needs to be done. Will hold on prednisone at this time due to unknown treatment for possible recurrence of cancer. - Prescribed Azithromycin. - Prescribed Hycodan cough medicine - Follow up if symptoms worsen or do not improve. - Go to the ER if you develop any trouble breathing or chest pain.       Powell Snell, PA-C

## 2024-12-22 ENCOUNTER — Ambulatory Visit
Admission: RE | Admit: 2024-12-22 | Discharge: 2024-12-22 | Disposition: A | Discharge status: Home / Self Care | Source: Ambulatory Visit | Attending: Interventional Radiology | Admitting: Interventional Radiology

## 2024-12-22 ENCOUNTER — Other Ambulatory Visit: Payer: Self-pay | Admitting: Interventional Radiology

## 2024-12-22 DIAGNOSIS — C787 Secondary malignant neoplasm of liver and intrahepatic bile duct: Secondary | ICD-10-CM

## 2024-12-22 DIAGNOSIS — Z8505 Personal history of malignant neoplasm of liver: Secondary | ICD-10-CM

## 2024-12-22 DIAGNOSIS — C220 Liver cell carcinoma: Secondary | ICD-10-CM

## 2024-12-22 NOTE — Progress Notes (Signed)
 "      Chief Complaint: Patient was consulted remotely today (TeleHealth) for follow up after ablation of hepatocelluar carcinoma.  History of Present Illness: Luke Wall is a 79 y.o. male with a history of cirrhosis previously treated by Dr. 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 Biopsy of both lesions at that time demonstrated well differentiated HCC. He did well following the procedure with follow up MRI studies on 03/01/24 and 06/15/24 demonstrating no evidence of recurrence at the ablation sites in the right lobe or new lesions. A follow up MRI on 12/07/24 has now demonstrated new suspicious lesions in segment II measuring up to 3.2 cm and segment VI up to 1.8 cm. He was presented at the [facility] on 12/15/24 by Dr. Melanee. I was asked to review imaging for management, as Dr. Bellman has left and is no longer with our practice.  Luke Wall states he is having no abdominal symptoms. He is to be scheduled for EGD soon by Dr. Aundria to follow esophageal varices and portal gastropathy.  Past Medical History:  Diagnosis Date   Angina pectoris    Aortic atherosclerosis    Ascending aorta dilation    a.) CT chest 11/04/2023: 4.1 cm   Cervicalgia    Cholelithiasis    Cirrhosis (HCC)    Coronary artery disease    a.) s/p PTCA/POBA in the 1990s at [facility] - details unclear; c.) CT chest 11/04/2023: 2v CAD   Diverticulosis    Full dentures    GERD (gastroesophageal reflux disease)    Heart murmur    Hepatocellular carcinoma (HCC) 2024   a.) stage II (T2 N0 M0)   History of alcohol abuse    History of bilateral cataract extraction 2019   History of blood transfusion 1960   a.) age 53; postoperatively following tonsillectomy   History of kidney stones    Hypertension    Myocardial infarction [facility])    Osteoarthritis of left knee 09/16/2020   Portal venous hypertension (HCC)    Rheumatoid arthritis (HCC)    Right  inguinal hernia    Splenomegaly    Thrombocytopenia     Past Surgical History:  Procedure Laterality Date   CARDIAC CATHETERIZATION  1990's   POBA   CATARACT EXTRACTION W/ INTRAOCULAR LENS  IMPLANT, BILATERAL Bilateral 2019   COLONOSCOPY  2018   CORONARY ANGIOPLASTY     CYSTOSCOPY W/ URETERAL STENT REMOVAL Bilateral 08/25/2023   Procedure: CYSTOSCOPY WITH STENT REMOVAL;  Surgeon: Twylla Glendia BROCKS, MD;  Location: [facility] ORS;  Service: Urology;  Laterality: Bilateral;   CYSTOSCOPY/URETEROSCOPY/HOLMIUM LASER/STENT PLACEMENT Bilateral 08/04/2023   Procedure: CYSTOSCOPY/URETEROSCOPY/HOLMIUM LASER/STENT PLACEMENT;  Surgeon: Twylla Glendia BROCKS, MD;  Location: [facility] ORS;  Service: Urology;  Laterality: Bilateral;   ESOPHAGOGASTRODUODENOSCOPY (EGD) WITH PROPOFOL  N/A 12/30/2023   Procedure: ESOPHAGOGASTRODUODENOSCOPY (EGD) WITH PROPOFOL ;  Surgeon: Toledo, Ladell POUR, MD;  Location: [facility] ENDOSCOPY;  Service: Gastroenterology;  Laterality: N/A;   EYE SURGERY     HEMORRHOID SURGERY N/A    IR RADIOLOGIST EVAL & MGMT  11/03/2023   IR RADIOLOGIST EVAL & MGMT  02/03/2024   KNEE ARTHROPLASTY Left 02/08/2021   Procedure: COMPUTER ASSISTED TOTAL KNEE ARTHROPLASTY - RNFA;  Surgeon: Mardee Lynwood SQUIBB, MD;  Location: [facility] ORS;  Service: Orthopedics;  Laterality: Left;   KNEE ARTHROPLASTY Right 06/17/2021   Procedure: COMPUTER ASSISTED TOTAL KNEE ARTHROPLASTY;  Surgeon: Mardee Lynwood SQUIBB, MD;  Location: [facility] ORS;  Service: Orthopedics;  Laterality: Right;   KNEE ARTHROSCOPY Left 1976   TONSILLECTOMY  1960    Allergies: Tramadol   Medications: Prior to Admission medications  Medication Sig Start Date End Date Taking? Authorizing Provider  acetaminophen  (TYLENOL ) 500 MG tablet Take 1,000 mg by mouth every 6 (six) hours as needed for moderate pain.    [provider]  albuterol  (VENTOLIN  HFA) 108 (90 Base) MCG/ACT inhaler Inhale into the lungs. 08/04/23   [provider]  aspirin EC 81 MG tablet  Take 81 mg by mouth every morning. Swallow whole.    [provider]  losartan (COZAAR) 25 MG tablet Take 25 mg by mouth daily. 05/23/24   [provider]  omeprazole (PRILOSEC) 20 MG capsule Take 20 mg by mouth every morning.    [provider]  potassium chloride  SA (KLOR-CON  M) 20 MEQ tablet Take 40 mEq by mouth daily. 08/15/24   [provider]  rosuvastatin  (CRESTOR ) 10 MG tablet Take 1 tablet by mouth every morning. 07/08/23   [provider]  senna-docusate (SENOKOT-S) 8.6-50 MG tablet Take 2 tablets by mouth. 01/22/24   [provider]  tamsulosin  (FLOMAX ) 0.4 MG CAPS capsule Take 1 capsule (0.4 mg total) by mouth daily after breakfast. 08/25/23   Stoioff, Glendia BROCKS, MD     Family History  Family history unknown: Yes    Social History   Socioeconomic History   Marital status: Married    Spouse name: Rock   Number of children: 1   Years of education: Not on file   Highest education level: Not on file  Occupational History   Not on file  Tobacco Use   Smoking status: Former    Current packs/day: 0.50    Average packs/day: 0.5 packs/day for 12.1 years (6.0 ttl pk-yrs)    Types: Cigarettes    Start date: 2000    Passive exposure: Past   Smokeless tobacco: Never  Vaping Use   Vaping status: Never Used  Substance and Sexual Activity   Alcohol use: Yes   Drug use: Never   Sexual activity: Not on file  Other Topics Concern   Not on file  Social History Narrative   Lives with wife at home. Pets(dog) at home   Social Drivers of Health   Tobacco Use: Medium Risk (12/21/2024)   Received from [facility]   Patient History    Smoking Tobacco Use: Former    Smokeless Tobacco Use: Never    Passive Exposure: Not on file  Financial Resource Strain: Medium Risk (05/06/2024)   Received from [facility]   Overall Financial Resource Strain (CARDIA)    Difficulty of Paying Living Expenses: Somewhat  hard  Food Insecurity: Food Insecurity Present (05/06/2024)   Received from [facility]   Epic    Within the past 12 months, you worried that your food would run out before you got the money to buy more.: Sometimes true    Within the past 12 months, the food you bought just didn't last and you didn't have money to get more.: Sometimes true  Transportation Needs: No Transportation Needs (05/06/2024)   Received from [place] - Transportation    In the past 12 months, has lack of transportation kept you from medical appointments or from getting medications?: No    Lack of Transportation (Non-Medical): No  Physical Activity: Not on file  Stress: Not on file  Social Connections: Moderately Integrated (01/06/2024)  Social Advertising Account Executive    Frequency of Communication with Friends and Family: More than three times a week    Frequency of Social Gatherings with Friends and Family: Once a week    Attends Religious Services: More than 4 times per year    Active Member of Clubs or Organizations: No    Attends Banker Meetings: Never    Marital Status: Married  Depression (PHQ2-9): Medium Risk (07/01/2024)   Depression (PHQ2-9)    PHQ-2 Score: 9  Alcohol Screen: Not on file  Housing: Unknown (11/30/2024)   Received from [facility]   Epic    In the last 12 months, was there a time when you were not able to pay the mortgage or rent on time?: No    Number of Times Moved in the Last Year: Not on file    At any time in the past 12 months, were you homeless or living in a shelter (including now)?: No  Utilities: At Risk (05/06/2024)   Received from [facility]   Epic    In the past 12 months has the electric, gas, oil, or water company threatened to shut off services in your home?: Yes  Health Literacy: Not on file    ECOG Status: 0 - Asymptomatic  Review of Systems  Review of Systems: A  12 point ROS discussed and pertinent positives are indicated in the HPI above.  All other systems are negative.   Physical Exam No direct physical exam was performed (except for noted visual exam findings with Video Visits).    Vital Signs: There were no vitals taken for this visit.  Imaging: MR ABDOMEN WWO CONTRAST Result Date: 12/07/2024 EXAM: MRI Abdomen With and Without Intravenous Contrast 12/07/2024 08:28:09 AM TECHNIQUE: Multiplanar multisequence MRI of the abdomen was performed with and without the administration of 8 mL of gadobutrol  (GADAVIST ) 1 MMOL/ML injection. COMPARISON: 06/15/2024. CLINICAL HISTORY: FINDINGS: LIVER: Cirrhotic morphology of the liver is again noted and appears unchanged from the previous exam. -Non-enhancing ablation site within segment 7 is again noted measuring 3.0 x 2.2 cm (image 22/16). This is T2 hypointense, T1 hyperintense without enhancement. On the previous exam, this measured 3.0 x 2.2 cm. -Non-enhancing T1 hyperintense, T2 hypointense ablation defect within the posterior medial right lobe of the liver is also unchanged measuring 3.2 x 3.5 cm (image 22/16). -Within the posterior dome of the right lobe of the liver, there is a 2.0 x 1.8 cm mildly T1 hyperintense, and slightly T2 hyperintense lesion with equivocal restricted diffusion and a suggestion of mild internal enhancement on the arterial phase subtraction images (image 17/15). On the previous exam this measured 1.9 x 1.5 cm. On the remote study from 10/20/2023, this measured 1.4 x 1.0 cm. -Within the posterior right lobe of the liver, there is an arterial phase enhancing structure measuring 1.8 x 1.7 cm (image 30/14). This is T1 isointense with equivocal restricted diffusion. Central washout. On the previous exam this lesion measured 1.1 x 0.9 cm (image 30/14). -A lesion within segment 2 of the lateral segment of the left lobe of the liver measures 3.2 x 2.8 cm (image 21/14). This is T1 hypointense with  arterial phase enhancement. There is washout with no definite pseudocapsule. On the previous exam there is a corresponding T2 signal abnormality measuring 2.1 x 1.8 cm. GALLBLADDER AND BILIARY SYSTEM: Gallbladder appears normal. No intrahepatic or extrahepatic ductal dilation. SPLEEN: The spleen is within normal limits in size  and appearance. PANCREAS: The pancreas is normal in size and contour without focal lesion or ductal dilatation. ADRENAL GLANDS: Normal size and morphology bilaterally. No nodule, thickening, or hemorrhage. No periadrenal stranding. KIDNEYS: Bilateral Bosniak class 1 and 2 kidney cysts are again noted and appear unchanged. LYMPH NODES: No abdominal adenopathy identified. VASCULATURE: Aortic atherosclerosis. Patent abdominal vascularity. Increased left upper quadrant varices. PERITONEUM: Similar appearance of upper abdominal ascites. BOWEL: A diverticulum arises from the transverse duodenum. ABDOMINAL WALL: No acute abnormality. BONES: No acute abnormality. IMPRESSION: 1. Two observations with non-rim arterial phase hyperenhancement and washout, including the segment 2 lesion (3.2 x 2.8 cm, enlarged) and the posterior right lobe lesion (1.8 x 1.7 cm, enlarged), consistent with hepatocellular carcinoma, LI-RADS 5 for each; recommend management by a multidisciplinary liver team. 2. Posterior dome right lobe observation (2.0 x 1.8 cm) with suggestion of mild arterial enhancement and equivocal restricted diffusion without definite washout, progressed over time, LI-RADS 4; recommend repeat liver MRI in 3 months or tissue diagnosis if it will change management. 3. Cirrhosis with portal hypertension, including increased left upper quadrant varices and similar upper abdominal ascites. 4. Postablation sites in segment 7 and the posteromedial right lobe without enhancement, LI-RADS TR, 5.  nonviable. Electronically signed by: Waddell Calk MD 12/07/2024 09:21 AM EST RP Workstation: [id number]     Labs:  CBC: Recent Labs    12/24/23 1307 01/05/24 0747 07/01/24 1416  WBC 4.0 4.4 4.8  HGB 14.2 14.3 14.2  HCT 41.1 40.1 40.2  PLT 77* 73* 70*    COAGS: Recent Labs    12/24/23 1307 01/05/24 0745  INR 1.2 1.3*    BMP: Recent Labs    12/24/23 1307 01/05/24 0747 01/06/24 0509 07/01/24 1416  NA 142 138 138 138  K 3.5 3.2* 4.3 4.5  CL 109 108 110 108  CO2 24 18* 19* 22  GLUCOSE 141* 94 121* 94  BUN 15 14 23 14   CALCIUM  9.1 9.3 9.2 10.0  CREATININE 1.04 1.00 1.11 0.97  GFRNONAA >60 >60 >60 >60    LIVER FUNCTION TESTS: Recent Labs    12/24/23 1307 01/05/24 0747 07/01/24 1416  BILITOT 2.3* 2.3* 2.3*  AST 40 46* 48*  ALT 26 24 31   ALKPHOS 57 57 94  PROT 6.9 7.1 6.9  ALBUMIN 3.4* 3.4* 3.4*    TUMOR MARKERS: Most recent AFP of 22.8 on 07/01/24  Assessment and Plan:  I spoke with Mr. Bitting and his wife Rock by phone. I told him that I was not sure by review of his MRI that the new liver lesions in his liver are real, especially in the right lobe, and it would be unusual not to be seen on the study last July and suddenly be over 3 cm in size in the left lobe 5.5 months later. I recommended that we evaluate both regions, and his entire liver, carefully by ultrasound, and perform contrast-enhanced ultrasound (CEUS) of any focal lesions seen by US  to try to correlate with the MRI. If suspicious lesion(s) are confirmed, biopsy can then be performed at that time under conscious sedation. He is in agreement with this plan. Limited paracentesis may also be needed if there is fluid surrounding his liver at the time of a biopsy procedure.  Mr. Pettengill would like to proceed with CEUS of the liver with possible biopsy at that time. We will schedule the procedure at [facility] on a day that I am there to perform the procedure.    Electronically Signed:  Marcey ONEIDA Moan 12/22/2024, 1:16 PM    I spent a total of 15 Minutes in remote  clinical consultation, greater than 50% of  which was counseling/coordinating care for follow up of hepatocellular carcinoma.    Visit type: Audio only (telephone). Audio (no video) only due to patient's lack of internet/smartphone capability. Alternative for in-person consultation at [facility], [address] [place], [place], [place]. This visit type was conducted due to national recommendations for restrictions regarding the COVID-19 Pandemic (e.g. social distancing).  This format is felt to be most appropriate for this patient at this time.  All issues noted in this document were discussed and addressed.   "

## 2024-12-27 ENCOUNTER — Ambulatory Visit: Admitting: Certified Registered"

## 2024-12-27 ENCOUNTER — Ambulatory Visit
Admission: RE | Admit: 2024-12-27 | Discharge: 2024-12-27 | Disposition: A | Attending: Internal Medicine | Admitting: Internal Medicine

## 2024-12-27 ENCOUNTER — Other Ambulatory Visit: Payer: Self-pay | Admitting: Interventional Radiology

## 2024-12-27 ENCOUNTER — Encounter: Payer: Self-pay | Admitting: Internal Medicine

## 2024-12-27 ENCOUNTER — Encounter: Admission: RE | Disposition: A | Payer: Self-pay | Source: Home / Self Care | Attending: Internal Medicine

## 2024-12-27 DIAGNOSIS — M1990 Unspecified osteoarthritis, unspecified site: Secondary | ICD-10-CM | POA: Insufficient documentation

## 2024-12-27 DIAGNOSIS — C787 Secondary malignant neoplasm of liver and intrahepatic bile duct: Secondary | ICD-10-CM

## 2024-12-27 DIAGNOSIS — I8510 Secondary esophageal varices without bleeding: Secondary | ICD-10-CM | POA: Insufficient documentation

## 2024-12-27 DIAGNOSIS — K3189 Other diseases of stomach and duodenum: Secondary | ICD-10-CM | POA: Insufficient documentation

## 2024-12-27 DIAGNOSIS — Z87891 Personal history of nicotine dependence: Secondary | ICD-10-CM | POA: Insufficient documentation

## 2024-12-27 DIAGNOSIS — I2510 Atherosclerotic heart disease of native coronary artery without angina pectoris: Secondary | ICD-10-CM | POA: Insufficient documentation

## 2024-12-27 DIAGNOSIS — K766 Portal hypertension: Secondary | ICD-10-CM | POA: Insufficient documentation

## 2024-12-27 DIAGNOSIS — I10 Essential (primary) hypertension: Secondary | ICD-10-CM | POA: Insufficient documentation

## 2024-12-27 DIAGNOSIS — K219 Gastro-esophageal reflux disease without esophagitis: Secondary | ICD-10-CM | POA: Insufficient documentation

## 2024-12-27 DIAGNOSIS — I252 Old myocardial infarction: Secondary | ICD-10-CM | POA: Insufficient documentation

## 2024-12-27 MED ORDER — SODIUM CHLORIDE 0.9 % IV SOLN
INTRAVENOUS | Status: DC
  Administered 2024-12-27: 500 mL via INTRAVENOUS

## 2024-12-27 MED ORDER — PROPOFOL 10 MG/ML IV BOLUS
INTRAVENOUS | Status: DC | PRN
  Administered 2024-12-27: 40 mg via INTRAVENOUS
  Administered 2024-12-27: 100 mg via INTRAVENOUS

## 2024-12-27 MED ORDER — LIDOCAINE HCL (CARDIAC) PF 100 MG/5ML IV SOSY
PREFILLED_SYRINGE | INTRAVENOUS | Status: DC | PRN
  Administered 2024-12-27: 80 mg via INTRAVENOUS

## 2024-12-27 NOTE — Anesthesia Preprocedure Evaluation (Signed)
"                                    Anesthesia Evaluation  Patient identified by MRN, date of birth, ID band Patient awake    Reviewed: Allergy & Precautions, H&P , NPO status , Patient's Chart, lab work & pertinent test results  History of Anesthesia Complications Negative for: history of anesthetic complications  Airway Mallampati: III  TM Distance: >3 FB Neck ROM: full    Dental  (+) Missing, Dental Advidsory Given, Poor Dentition   Pulmonary neg shortness of breath, sleep apnea , neg COPD, neg recent URI, former smoker   Pulmonary exam normal        Cardiovascular Exercise Tolerance: Good hypertension, (-) angina + CAD, + Past MI and + Peripheral Vascular Disease (4.1cm AAA)  (-) Cardiac Stents Normal cardiovascular exam(-) dysrhythmias (-) Valvular Problems/Murmurs     Neuro/Psych negative neurological ROS  negative psych ROS   GI/Hepatic Neg liver ROS,GERD  Medicated and Controlled,,  Endo/Other  negative endocrine ROS    Renal/GU negative Renal ROS     Musculoskeletal  (+) Arthritis ,    Abdominal   Peds  Hematology negative hematology ROS (+)   Anesthesia Other Findings Past Medical History: No date: Angina pectoris (HCC) No date: Aortic atherosclerosis (HCC) No date: Ascending aorta dilation (HCC)     Comment:  a.) CT chest 11/04/2023: 4.1 cm No date: Cervicalgia No date: Cholelithiasis No date: Cirrhosis (HCC) No date: Coronary artery disease     Comment:  a.) s/p PTCA/POBA in the 1990s at [facility] -               details unclear; c.) CT chest 11/04/2023: 2v CAD No date: Diverticulosis No date: Full dentures No date: GERD (gastroesophageal reflux disease) No date: Heart murmur 2024: Hepatocellular carcinoma (HCC)     Comment:  a.) stage II (T2 N0 M0) No date: History of alcohol abuse 2019: History of bilateral cataract extraction 1960: History of blood transfusion     Comment:  a.) age 45; postoperatively following  tonsillectomy No date: History of kidney stones No date: Hypertension No date: Myocardial infarction (HCC) 09/16/2020: Osteoarthritis of left knee No date: Portal venous hypertension (HCC) No date: Rheumatoid arthritis (HCC) No date: Right inguinal hernia No date: Splenomegaly No date: Thrombocytopenia (HCC)   Reproductive/Obstetrics negative OB ROS                              Anesthesia Physical Anesthesia Plan  ASA: 3  Anesthesia Plan: General   Post-op Pain Management:    Induction: Intravenous  PONV Risk Score and Plan: 2 and Treatment may vary due to age or medical condition, Propofol  infusion and TIVA  Airway Management Planned: Natural Airway and Nasal Cannula  Additional Equipment:   Intra-op Plan:   Post-operative Plan:   Informed Consent: I have reviewed the patients History and Physical, chart, labs and discussed the procedure including the risks, benefits and alternatives for the proposed anesthesia with the patient or authorized representative who has indicated his/her understanding and acceptance.     Dental Advisory Given  Plan Discussed with: Anesthesiologist, CRNA and Surgeon  Anesthesia Plan Comments:          Anesthesia Quick Evaluation  "

## 2024-12-27 NOTE — Interval H&P Note (Signed)
 History and Physical Interval Note:  12/27/2024 8:12 AM  Luke Wall  has presented today for surgery, with the diagnosis of Hepatocellular carcinoma (CMS/HHS-HCC) (C22.0) Alcoholic cirrhosis of liver with ascites (CMS/HHS-HCC) (K70.31) Portal hypertension with esophageal varices (CMS/HHS-HCC) (K76.6,I85.00).  The various methods of treatment have been discussed with the patient and family. After consideration of risks, benefits and other options for treatment, the patient has consented to  Procedures: EGD (ESOPHAGOGASTRODUODENOSCOPY) (N/A) as a surgical intervention.  The patient's history has been reviewed, patient examined, no change in status, stable for surgery.  I have reviewed the patient's chart and labs.  Questions were answered to the patient's satisfaction.     [place], Daven Pinckney

## 2025-01-03 ENCOUNTER — Ambulatory Visit

## 2025-01-06 ENCOUNTER — Ambulatory Visit: Admitting: Oncology

## 2025-01-06 ENCOUNTER — Other Ambulatory Visit

## 2025-03-14 ENCOUNTER — Ambulatory Visit: Admitting: Cardiovascular Disease
